# Patient Record
Sex: Female | Born: 1975 | Race: Black or African American | Hispanic: No | Marital: Married | Smoking: Never smoker
Health system: Southern US, Community
[De-identification: ages and names within clinical notes are randomized; demographics above are authoritative.]

## PROBLEM LIST (undated history)

## (undated) DIAGNOSIS — Z9289 Personal history of other medical treatment: Secondary | ICD-10-CM

## (undated) DIAGNOSIS — J329 Chronic sinusitis, unspecified: Secondary | ICD-10-CM

## (undated) DIAGNOSIS — R079 Chest pain, unspecified: Secondary | ICD-10-CM

## (undated) DIAGNOSIS — A4902 Methicillin resistant Staphylococcus aureus infection, unspecified site: Secondary | ICD-10-CM

## (undated) DIAGNOSIS — R0602 Shortness of breath: Secondary | ICD-10-CM

## (undated) DIAGNOSIS — Z9884 Bariatric surgery status: Secondary | ICD-10-CM

## (undated) DIAGNOSIS — G4730 Sleep apnea, unspecified: Secondary | ICD-10-CM

## (undated) DIAGNOSIS — R102 Pelvic and perineal pain unspecified side: Secondary | ICD-10-CM

## (undated) DIAGNOSIS — I10 Essential (primary) hypertension: Secondary | ICD-10-CM

## (undated) DIAGNOSIS — N83209 Unspecified ovarian cyst, unspecified side: Secondary | ICD-10-CM

## (undated) DIAGNOSIS — K760 Fatty (change of) liver, not elsewhere classified: Secondary | ICD-10-CM

## (undated) DIAGNOSIS — E7800 Pure hypercholesterolemia, unspecified: Secondary | ICD-10-CM

## (undated) DIAGNOSIS — R51 Headache: Secondary | ICD-10-CM

## (undated) DIAGNOSIS — M549 Dorsalgia, unspecified: Secondary | ICD-10-CM

## (undated) DIAGNOSIS — D649 Anemia, unspecified: Secondary | ICD-10-CM

## (undated) DIAGNOSIS — R160 Hepatomegaly, not elsewhere classified: Secondary | ICD-10-CM

## (undated) DIAGNOSIS — M419 Scoliosis, unspecified: Secondary | ICD-10-CM

## (undated) DIAGNOSIS — F32A Depression, unspecified: Secondary | ICD-10-CM

## (undated) DIAGNOSIS — N390 Urinary tract infection, site not specified: Secondary | ICD-10-CM

## (undated) DIAGNOSIS — E538 Deficiency of other specified B group vitamins: Secondary | ICD-10-CM

## (undated) DIAGNOSIS — R519 Headache, unspecified: Secondary | ICD-10-CM

## (undated) DIAGNOSIS — F329 Major depressive disorder, single episode, unspecified: Secondary | ICD-10-CM

## (undated) DIAGNOSIS — B019 Varicella without complication: Secondary | ICD-10-CM

## (undated) DIAGNOSIS — E559 Vitamin D deficiency, unspecified: Secondary | ICD-10-CM

## (undated) DIAGNOSIS — F1011 Alcohol abuse, in remission: Secondary | ICD-10-CM

## (undated) DIAGNOSIS — R000 Tachycardia, unspecified: Secondary | ICD-10-CM

## (undated) DIAGNOSIS — F419 Anxiety disorder, unspecified: Secondary | ICD-10-CM

## (undated) DIAGNOSIS — F319 Bipolar disorder, unspecified: Secondary | ICD-10-CM

## (undated) DIAGNOSIS — M1990 Unspecified osteoarthritis, unspecified site: Secondary | ICD-10-CM

## (undated) HISTORY — PX: TUBAL LIGATION: SHX77

## (undated) HISTORY — DX: Pure hypercholesterolemia, unspecified: E78.00

## (undated) HISTORY — DX: Vitamin D deficiency, unspecified: E55.9

## (undated) HISTORY — DX: Urinary tract infection, site not specified: N39.0

## (undated) HISTORY — DX: Essential (primary) hypertension: I10

## (undated) HISTORY — DX: Varicella without complication: B01.9

## (undated) HISTORY — PX: NASAL SINUS SURGERY: SHX719

## (undated) HISTORY — DX: Chest pain, unspecified: R07.9

## (undated) HISTORY — DX: Anemia, unspecified: D64.9

## (undated) HISTORY — DX: Personal history of other medical treatment: Z92.89

## (undated) HISTORY — DX: Bipolar disorder, unspecified: F31.9

## (undated) HISTORY — DX: Sleep apnea, unspecified: G47.30

## (undated) HISTORY — DX: Fatty (change of) liver, not elsewhere classified: K76.0

## (undated) HISTORY — DX: Chronic sinusitis, unspecified: J32.9

## (undated) HISTORY — DX: Headache, unspecified: R51.9

## (undated) HISTORY — DX: Shortness of breath: R06.02

## (undated) HISTORY — DX: Depression, unspecified: F32.A

## (undated) HISTORY — DX: Deficiency of other specified B group vitamins: E53.8

## (undated) HISTORY — DX: Major depressive disorder, single episode, unspecified: F32.9

## (undated) HISTORY — PX: GASTRIC BYPASS: SHX52

## (undated) HISTORY — DX: Dorsalgia, unspecified: M54.9

## (undated) HISTORY — DX: Headache: R51

---

## 1996-10-18 HISTORY — PX: CHOLECYSTECTOMY: SHX55

## 1998-01-06 ENCOUNTER — Ambulatory Visit (HOSPITAL_COMMUNITY): Admission: RE | Admit: 1998-01-06 | Discharge: 1998-01-06 | Payer: Self-pay | Admitting: Internal Medicine

## 1998-01-23 ENCOUNTER — Ambulatory Visit (HOSPITAL_COMMUNITY): Admission: RE | Admit: 1998-01-23 | Discharge: 1998-01-24 | Payer: Self-pay | Admitting: General Surgery

## 1998-04-23 ENCOUNTER — Other Ambulatory Visit: Admission: RE | Admit: 1998-04-23 | Discharge: 1998-04-23 | Payer: Self-pay | Admitting: Obstetrics

## 1998-09-01 ENCOUNTER — Ambulatory Visit (HOSPITAL_COMMUNITY): Admission: RE | Admit: 1998-09-01 | Discharge: 1998-09-01 | Payer: Self-pay | Admitting: Obstetrics

## 1998-11-27 ENCOUNTER — Other Ambulatory Visit: Admission: RE | Admit: 1998-11-27 | Discharge: 1998-11-27 | Payer: Self-pay | Admitting: Obstetrics

## 1999-08-01 ENCOUNTER — Emergency Department (HOSPITAL_COMMUNITY): Admission: EM | Admit: 1999-08-01 | Discharge: 1999-08-01 | Payer: Self-pay | Admitting: *Deleted

## 1999-12-05 ENCOUNTER — Emergency Department (HOSPITAL_COMMUNITY): Admission: EM | Admit: 1999-12-05 | Discharge: 1999-12-05 | Payer: Self-pay | Admitting: Emergency Medicine

## 1999-12-08 ENCOUNTER — Emergency Department (HOSPITAL_COMMUNITY): Admission: EM | Admit: 1999-12-08 | Discharge: 1999-12-08 | Payer: Self-pay

## 2000-10-18 HISTORY — PX: GASTRIC BYPASS: SHX52

## 2002-10-30 ENCOUNTER — Ambulatory Visit (HOSPITAL_COMMUNITY): Admission: RE | Admit: 2002-10-30 | Discharge: 2002-10-30 | Payer: Self-pay | Admitting: Internal Medicine

## 2002-10-30 ENCOUNTER — Encounter: Payer: Self-pay | Admitting: Internal Medicine

## 2003-02-09 ENCOUNTER — Emergency Department (HOSPITAL_COMMUNITY): Admission: EM | Admit: 2003-02-09 | Discharge: 2003-02-09 | Payer: Self-pay | Admitting: Emergency Medicine

## 2003-02-09 ENCOUNTER — Encounter: Payer: Self-pay | Admitting: Emergency Medicine

## 2003-07-15 ENCOUNTER — Emergency Department (HOSPITAL_COMMUNITY): Admission: EM | Admit: 2003-07-15 | Discharge: 2003-07-15 | Payer: Self-pay | Admitting: Emergency Medicine

## 2003-07-15 ENCOUNTER — Encounter: Payer: Self-pay | Admitting: Emergency Medicine

## 2004-02-20 ENCOUNTER — Emergency Department (HOSPITAL_COMMUNITY): Admission: EM | Admit: 2004-02-20 | Discharge: 2004-02-21 | Payer: Self-pay | Admitting: Emergency Medicine

## 2004-03-03 ENCOUNTER — Emergency Department (HOSPITAL_COMMUNITY): Admission: EM | Admit: 2004-03-03 | Discharge: 2004-03-03 | Payer: Self-pay

## 2004-04-23 ENCOUNTER — Emergency Department (HOSPITAL_COMMUNITY): Admission: EM | Admit: 2004-04-23 | Discharge: 2004-04-24 | Payer: Self-pay | Admitting: Emergency Medicine

## 2004-06-30 ENCOUNTER — Emergency Department (HOSPITAL_COMMUNITY): Admission: EM | Admit: 2004-06-30 | Discharge: 2004-06-30 | Payer: Self-pay | Admitting: *Deleted

## 2004-07-06 ENCOUNTER — Emergency Department (HOSPITAL_COMMUNITY): Admission: EM | Admit: 2004-07-06 | Discharge: 2004-07-06 | Payer: Self-pay | Admitting: Family Medicine

## 2005-02-19 ENCOUNTER — Emergency Department (HOSPITAL_COMMUNITY): Admission: EM | Admit: 2005-02-19 | Discharge: 2005-02-19 | Payer: Self-pay | Admitting: Emergency Medicine

## 2005-09-20 ENCOUNTER — Emergency Department (HOSPITAL_COMMUNITY): Admission: EM | Admit: 2005-09-20 | Discharge: 2005-09-21 | Payer: Self-pay | Admitting: Emergency Medicine

## 2005-09-28 ENCOUNTER — Emergency Department (HOSPITAL_COMMUNITY): Admission: EM | Admit: 2005-09-28 | Discharge: 2005-09-29 | Payer: Self-pay | Admitting: Emergency Medicine

## 2005-11-29 ENCOUNTER — Emergency Department (HOSPITAL_COMMUNITY): Admission: EM | Admit: 2005-11-29 | Discharge: 2005-11-29 | Payer: Self-pay | Admitting: Family Medicine

## 2006-02-18 ENCOUNTER — Emergency Department (HOSPITAL_COMMUNITY): Admission: EM | Admit: 2006-02-18 | Discharge: 2006-02-18 | Payer: Self-pay | Admitting: Family Medicine

## 2006-06-29 ENCOUNTER — Emergency Department (HOSPITAL_COMMUNITY): Admission: EM | Admit: 2006-06-29 | Discharge: 2006-06-29 | Payer: Self-pay | Admitting: Family Medicine

## 2007-08-08 ENCOUNTER — Emergency Department (HOSPITAL_COMMUNITY): Admission: EM | Admit: 2007-08-08 | Discharge: 2007-08-08 | Payer: Self-pay | Admitting: Emergency Medicine

## 2007-10-19 DIAGNOSIS — Z9289 Personal history of other medical treatment: Secondary | ICD-10-CM

## 2007-10-19 HISTORY — DX: Personal history of other medical treatment: Z92.89

## 2008-10-18 HISTORY — PX: OTHER SURGICAL HISTORY: SHX169

## 2008-11-11 ENCOUNTER — Inpatient Hospital Stay (HOSPITAL_COMMUNITY): Admission: EM | Admit: 2008-11-11 | Discharge: 2008-11-12 | Payer: Self-pay | Admitting: Emergency Medicine

## 2008-11-11 ENCOUNTER — Ambulatory Visit: Payer: Self-pay | Admitting: *Deleted

## 2008-12-04 ENCOUNTER — Encounter (INDEPENDENT_AMBULATORY_CARE_PROVIDER_SITE_OTHER): Payer: Self-pay | Admitting: Obstetrics

## 2008-12-04 ENCOUNTER — Ambulatory Visit (HOSPITAL_COMMUNITY): Admission: RE | Admit: 2008-12-04 | Discharge: 2008-12-04 | Payer: Self-pay | Admitting: Obstetrics

## 2009-01-27 ENCOUNTER — Encounter (INDEPENDENT_AMBULATORY_CARE_PROVIDER_SITE_OTHER): Payer: Self-pay | Admitting: Otolaryngology

## 2009-01-27 ENCOUNTER — Ambulatory Visit (HOSPITAL_BASED_OUTPATIENT_CLINIC_OR_DEPARTMENT_OTHER): Admission: RE | Admit: 2009-01-27 | Discharge: 2009-01-27 | Payer: Self-pay | Admitting: Otolaryngology

## 2009-05-09 ENCOUNTER — Emergency Department (HOSPITAL_COMMUNITY): Admission: EM | Admit: 2009-05-09 | Discharge: 2009-05-09 | Payer: Self-pay | Admitting: Emergency Medicine

## 2009-06-13 ENCOUNTER — Emergency Department (HOSPITAL_COMMUNITY): Admission: EM | Admit: 2009-06-13 | Discharge: 2009-06-13 | Payer: Self-pay | Admitting: Emergency Medicine

## 2009-07-09 ENCOUNTER — Ambulatory Visit (HOSPITAL_COMMUNITY): Admission: RE | Admit: 2009-07-09 | Discharge: 2009-07-09 | Payer: Self-pay | Admitting: Obstetrics

## 2009-12-09 ENCOUNTER — Emergency Department (HOSPITAL_COMMUNITY): Admission: EM | Admit: 2009-12-09 | Discharge: 2009-12-09 | Payer: Self-pay | Admitting: Emergency Medicine

## 2010-05-20 ENCOUNTER — Emergency Department (HOSPITAL_COMMUNITY): Admission: EM | Admit: 2010-05-20 | Discharge: 2010-05-20 | Payer: Self-pay | Admitting: Family Medicine

## 2011-01-13 ENCOUNTER — Inpatient Hospital Stay (INDEPENDENT_AMBULATORY_CARE_PROVIDER_SITE_OTHER)
Admission: RE | Admit: 2011-01-13 | Discharge: 2011-01-13 | Disposition: A | Discharge status: Home / Self Care | Payer: Self-pay | Source: Ambulatory Visit | Attending: Family Medicine | Admitting: Family Medicine

## 2011-01-13 DIAGNOSIS — N760 Acute vaginitis: Secondary | ICD-10-CM

## 2011-01-13 DIAGNOSIS — J309 Allergic rhinitis, unspecified: Secondary | ICD-10-CM

## 2011-01-13 LAB — POCT URINALYSIS DIP (DEVICE)
Hgb urine dipstick: NEGATIVE
Specific Gravity, Urine: 1.02 (ref 1.005–1.030)
pH: 6 (ref 5.0–8.0)

## 2011-01-13 LAB — WET PREP, GENITAL: Yeast Wet Prep HPF POC: NONE SEEN

## 2011-01-14 LAB — GC/CHLAMYDIA PROBE AMP, GENITAL: GC Probe Amp, Genital: NEGATIVE

## 2011-01-23 LAB — PREGNANCY, URINE: Preg Test, Ur: NEGATIVE

## 2011-01-23 LAB — CBC
HCT: 38.3 % (ref 36.0–46.0)
Hemoglobin: 12.9 g/dL (ref 12.0–15.0)
MCHC: 33.6 g/dL (ref 30.0–36.0)
Platelets: 260 10*3/uL (ref 150–400)
RBC: 4.51 MIL/uL (ref 3.87–5.11)
WBC: 10.2 10*3/uL (ref 4.0–10.5)

## 2011-01-23 LAB — URINALYSIS, ROUTINE W REFLEX MICROSCOPIC
Bilirubin Urine: NEGATIVE
Glucose, UA: NEGATIVE mg/dL
Hgb urine dipstick: NEGATIVE
pH: 6 (ref 5.0–8.0)

## 2011-01-23 LAB — BASIC METABOLIC PANEL
Calcium: 8.7 mg/dL (ref 8.4–10.5)
Creatinine, Ser: 0.71 mg/dL (ref 0.4–1.2)
Glucose, Bld: 95 mg/dL (ref 70–99)
Sodium: 133 mEq/L — ABNORMAL LOW (ref 135–145)

## 2011-01-23 LAB — DIFFERENTIAL
Basophils Relative: 1 % (ref 0–1)
Eosinophils Relative: 4 % (ref 0–5)
Lymphocytes Relative: 28 % (ref 12–46)
Neutro Abs: 6.4 10*3/uL (ref 1.7–7.7)

## 2011-01-27 LAB — POCT HEMOGLOBIN-HEMACUE: Hemoglobin: 11.6 g/dL — ABNORMAL LOW (ref 12.0–15.0)

## 2011-02-01 LAB — URINALYSIS, ROUTINE W REFLEX MICROSCOPIC
Glucose, UA: NEGATIVE mg/dL
Hgb urine dipstick: NEGATIVE
Protein, ur: NEGATIVE mg/dL
pH: 6.5 (ref 5.0–8.0)

## 2011-02-01 LAB — POCT I-STAT, CHEM 8
BUN: 4 mg/dL — ABNORMAL LOW (ref 6–23)
Hemoglobin: 8.5 g/dL — ABNORMAL LOW (ref 12.0–15.0)
Potassium: 3.7 mEq/L (ref 3.5–5.1)
Sodium: 139 mEq/L (ref 135–145)
TCO2: 22 mmol/L (ref 0–100)

## 2011-02-01 LAB — DIFFERENTIAL
Basophils Relative: 0 % (ref 0–1)
Eosinophils Relative: 2 % (ref 0–5)
Lymphocytes Relative: 42 % (ref 12–46)
Monocytes Relative: 6 % (ref 3–12)
Neutro Abs: 2.5 10*3/uL (ref 1.7–7.7)
Neutrophils Relative %: 50 % (ref 43–77)

## 2011-02-01 LAB — CROSSMATCH: Antibody Screen: NEGATIVE

## 2011-02-01 LAB — CBC
HCT: 21.7 % — ABNORMAL LOW (ref 36.0–46.0)
HCT: 24.6 % — ABNORMAL LOW (ref 36.0–46.0)
HCT: 29.3 % — ABNORMAL LOW (ref 36.0–46.0)
Hemoglobin: 6.1 g/dL — CL (ref 12.0–15.0)
Hemoglobin: 7.4 g/dL — CL (ref 12.0–15.0)
Hemoglobin: 9.1 g/dL — ABNORMAL LOW (ref 12.0–15.0)
MCHC: 30 g/dL (ref 30.0–36.0)
MCHC: 30.2 g/dL (ref 30.0–36.0)
MCHC: 31.1 g/dL (ref 30.0–36.0)
MCV: 61.8 fL — ABNORMAL LOW (ref 78.0–100.0)
MCV: 62.9 fL — ABNORMAL LOW (ref 78.0–100.0)
Platelets: 282 10*3/uL (ref 150–400)
RBC: 3.94 MIL/uL (ref 3.87–5.11)
RBC: 4.2 MIL/uL (ref 3.87–5.11)
RDW: 21.9 % — ABNORMAL HIGH (ref 11.5–15.5)
RDW: 30.2 % — ABNORMAL HIGH (ref 11.5–15.5)
RDW: 30.7 % — ABNORMAL HIGH (ref 11.5–15.5)
RDW: 32.8 % — ABNORMAL HIGH (ref 11.5–15.5)

## 2011-02-01 LAB — IRON AND TIBC
Iron: 306 ug/dL — ABNORMAL HIGH (ref 42–135)
Iron: <10 ug/dL — ABNORMAL LOW (ref 42–135)
UIBC: 402 ug/dL

## 2011-02-01 LAB — COMPREHENSIVE METABOLIC PANEL
BUN: 4 mg/dL — ABNORMAL LOW (ref 6–23)
Calcium: 8.2 mg/dL — ABNORMAL LOW (ref 8.4–10.5)
Glucose, Bld: 88 mg/dL (ref 70–99)
Total Protein: 6.2 g/dL (ref 6.0–8.3)

## 2011-02-01 LAB — METHYLMALONIC ACID, SERUM: Methylmalonic Acid, Quantitative: 419 nmol/L — ABNORMAL HIGH (ref 87–318)

## 2011-02-01 LAB — RETICULOCYTES
RBC.: 3.83 MIL/uL — ABNORMAL LOW (ref 3.87–5.11)
Retic Count, Absolute: 34.5 10*3/uL (ref 19.0–186.0)
Retic Ct Pct: 0.5 % (ref 0.4–3.1)

## 2011-02-01 LAB — APTT: aPTT: 41 seconds — ABNORMAL HIGH (ref 24–37)

## 2011-02-01 LAB — VITAMIN B12: Vitamin B-12: 233 pg/mL (ref 211–911)

## 2011-02-01 LAB — ABO/RH: ABO/RH(D): B NEG

## 2011-02-01 LAB — LIPID PANEL
Cholesterol: 124 mg/dL (ref 0–200)
LDL Cholesterol: 81 mg/dL (ref 0–99)
Triglycerides: 57 mg/dL (ref <150)

## 2011-02-01 LAB — POCT PREGNANCY, URINE: Preg Test, Ur: NEGATIVE

## 2011-02-01 LAB — HEMOGLOBIN A1C
Hgb A1c MFr Bld: 5.1 % (ref 4.6–6.1)
Mean Plasma Glucose: 100 mg/dL

## 2011-02-01 LAB — LACTATE DEHYDROGENASE: LDH: 122 U/L (ref 94–250)

## 2011-02-01 LAB — PROTIME-INR: INR: 1.1 (ref 0.00–1.49)

## 2011-02-01 LAB — FERRITIN: Ferritin: 3 ng/mL — ABNORMAL LOW (ref 10–291)

## 2011-02-01 LAB — HEMOCCULT GUIAC POC 1CARD (OFFICE): Fecal Occult Bld: NEGATIVE

## 2011-02-02 LAB — PREGNANCY, URINE: Preg Test, Ur: NEGATIVE

## 2011-02-02 LAB — CBC
MCHC: 31.1 g/dL (ref 30.0–36.0)
RBC: 4.76 MIL/uL (ref 3.87–5.11)
RDW: 36.6 % — ABNORMAL HIGH (ref 11.5–15.5)

## 2011-02-08 ENCOUNTER — Emergency Department (HOSPITAL_COMMUNITY)
Admission: EM | Admit: 2011-02-08 | Discharge: 2011-02-09 | Disposition: A | Discharge status: Home / Self Care | Payer: Self-pay | Attending: Emergency Medicine | Admitting: Emergency Medicine

## 2011-02-08 ENCOUNTER — Emergency Department (HOSPITAL_COMMUNITY): Payer: Self-pay

## 2011-02-08 DIAGNOSIS — R05 Cough: Secondary | ICD-10-CM | POA: Insufficient documentation

## 2011-02-08 DIAGNOSIS — R0609 Other forms of dyspnea: Secondary | ICD-10-CM | POA: Insufficient documentation

## 2011-02-08 DIAGNOSIS — F410 Panic disorder [episodic paroxysmal anxiety] without agoraphobia: Secondary | ICD-10-CM | POA: Insufficient documentation

## 2011-02-08 DIAGNOSIS — R079 Chest pain, unspecified: Secondary | ICD-10-CM | POA: Insufficient documentation

## 2011-02-08 DIAGNOSIS — R000 Tachycardia, unspecified: Secondary | ICD-10-CM | POA: Insufficient documentation

## 2011-02-08 DIAGNOSIS — Z9889 Other specified postprocedural states: Secondary | ICD-10-CM | POA: Insufficient documentation

## 2011-02-08 DIAGNOSIS — R0989 Other specified symptoms and signs involving the circulatory and respiratory systems: Secondary | ICD-10-CM | POA: Insufficient documentation

## 2011-02-08 DIAGNOSIS — R059 Cough, unspecified: Secondary | ICD-10-CM | POA: Insufficient documentation

## 2011-02-09 LAB — CBC
HCT: 40.2 % (ref 36.0–46.0)
Hemoglobin: 13.6 g/dL (ref 12.0–15.0)
MCH: 28.9 pg (ref 26.0–34.0)
MCHC: 33.8 g/dL (ref 30.0–36.0)
MCV: 85.5 fL (ref 78.0–100.0)
Platelets: 268 10*3/uL (ref 150–400)
RBC: 4.7 MIL/uL (ref 3.87–5.11)
RDW: 13.5 % (ref 11.5–15.5)
WBC: 10.3 10*3/uL (ref 4.0–10.5)

## 2011-02-09 LAB — DIFFERENTIAL
Basophils Absolute: 0 10*3/uL (ref 0.0–0.1)
Basophils Relative: 0 % (ref 0–1)
Eosinophils Absolute: 0.3 10*3/uL (ref 0.0–0.7)
Eosinophils Relative: 3 % (ref 0–5)
Lymphocytes Relative: 37 % (ref 12–46)
Lymphs Abs: 3.8 10*3/uL (ref 0.7–4.0)
Monocytes Absolute: 0.5 10*3/uL (ref 0.1–1.0)
Monocytes Relative: 5 % (ref 3–12)
Neutro Abs: 5.7 10*3/uL (ref 1.7–7.7)
Neutrophils Relative %: 55 % (ref 43–77)

## 2011-02-09 LAB — PROTIME-INR: Prothrombin Time: 12 seconds (ref 11.6–15.2)

## 2011-02-09 LAB — D-DIMER, QUANTITATIVE: D-Dimer, Quant: 0.25 ug/mL-FEU (ref 0.00–0.48)

## 2011-02-09 LAB — BASIC METABOLIC PANEL
BUN: 6 mg/dL (ref 6–23)
Creatinine, Ser: 0.87 mg/dL (ref 0.4–1.2)
GFR calc non Af Amer: >60 mL/min (ref >60)

## 2011-02-09 LAB — APTT: aPTT: 34 seconds (ref 24–37)

## 2011-03-02 NOTE — Discharge Summary (Signed)
NAME:  Valerie Morales, KRIKORIAN NO.:  [id number]   MEDICAL RECORD NO.:  [id number]          PATIENT TYPE:  INP   LOCATION:  4738                         FACILITY:  [facility]   PHYSICIAN:  Manning Charity, MD     DATE OF BIRTH:  August 06, 1976   DATE OF ADMISSION:  11/11/2008  DATE OF DISCHARGE:  11/12/2008                               DISCHARGE SUMMARY   ATTENDING PHYSICIAN:  Manning Charity, MD   CONTINUITY DOCTOR:  Kathreen Cosier, MD, gynecologist.   DISCHARGE DIAGNOSES:  1. Anemia, microcytic, microchromic, secondary to iron deficiency,      secondary to menometrorrhagia.  2. Chronic sinusitis.  3. Bilateral ovarian cysts by ultrasound.   DISCHARGE MEDICATIONS:  Ferrous sulfate 325 mg p.o. 3 times a day.   DISPOSITION ON FOLLOWUP:  Ms. [name] will follow up with her primary  care doctor on November 13, 2008, at 10:30 a.m.  During that appointment  consider to order methylmalonic level.  The patient might need vitamin  B12 supplement.  The patient might need a workup for von Willebrand, she  will need a bleeding time, and she will need also platelet quality  function.   PROCEDURE PERFORMED:  Transvaginal ultrasound:  shows normal sonographic  appearance of the uterus and endometrial measured up to 13 mm and  appears uniform, bilateral simple appearing ovarian cysts measuring up  to 2.9 cm, a small volume of pelvic free fluid likely physiologic.   HISTORY OF PRESENT ILLNESS:  This is a 35 year old woman with past  medical history significant for sinusitis, BTL in 1998, cholecystectomy,  and C-section who presented to emergency department secondary to being  found to have a low hemoglobin level at a surgical center.  The patient  was supposed to have a sinus surgery today and was noted to have  hemoglobin reportedly in the 5 range.  The patient reports, she has had  hemoglobin last checked in September 2009, which was at 8, and this was  done by Dr. [name].  She does  report heavy period and her last one was  on November 04, 2008.  She reports her periods are variable lasts 3-7  days, occurs every 21 days on an average, she used 3 tampons and 4 pads  daily.  The patient denies shortness of breath, dizziness, chest pain,  no fever, chills, melena, hematochezia, abdominal pain, and dysuria.  She was complaining of left maxillary sinus tenderness and cold  intolerance.   PHYSICAL EXAMINATION:  VITAL SIGNS:  Temperature  97.1, blood pressure  122/87, pulse 100, respirations 20, and sat 99% on room air.  GENERAL:  The patient is in no acute distress.  HEENT:  Pale-appearing eyes.  Pupil equal reactive to light, anicteric.  ENT:  No edema.  Positive maxillary sinus tenderness.  RESPIRATION:  Clear to auscultation.  CARDIOVASCULAR:  S1 and S2 normal and tachycardic.  No murmur.  GASTROINTESTINAL:  Soft, nontender, and nondistended.  Bowel sounds are  positive.  EXTREMITY:  No edema.  PSYCHIATRIC:  Appropriate.   ADMISSION LABS:  FOBT negative.  Sodium 139, potassium 3.7, chloride  105, bicarb 2,  BUN 4, creatinine 0.6, and glucose 88.  Hemoglobin 6.1,  white blood cell 5.0, hematocrit  21.7, and platelets 324.  MCV 55, RDW  21.  She has teardrop cell and elliptocytes.   HOSPITAL COURSE:  1. Anemia, microcytic, microchromic.  This is likely secondary to iron      deficiency anemia, secondary to menorrhagia also nutrition      deficiency secondary to gastric bypass can be also causing      patient's anemia.  The patient was admitted, anemia panel was      ordered, and this showed iron less than 10, B12 233, normal      borderline low, folate level 6.9, ferritin level 2,  this is      consistent with iron deficiency anemia.  The patient might need to      have intrinsic factor antibody, B12 level was in the low normal      range.  She might benefit from B12 supplement injection as an      outpatient.  The patient had also teardrop cell, she might need       electrophoresis of proteins to rule out thalassemia.  It was not      able to perform in the hospital, because the patient already      received transfusion and this may affect the results of the protein      electrophoresis.  Also, the patient will need a workup for bleeding      disorders.  She has heavy menstrual period and occasional      nosebleeds.  Her TSH was normal 0.667.  She will need a bleeding      time, von Willebrand factor, and platelet qualitative function.      Her platelet count was within normal limit at 256.  PT was normal      14.1, PTT was 41 slightly elevated these can be consistent with von      Willebrand disease.  Also a workup for hemolysis was done, LDH was      122 within normal limits.   1. Tachycardia.  It was sinus tachycardia by EKG.  It was likely      secondary to anemia after  transfusion patient's pulse decreased to      73.  TSH was normal to rule out hyperthyroidism.  2. The patient was discharged in good condition.  She denies any      bleeding.   LABS ON DISCHARGE:  Sodium 141, potassium 3.9, chloride 110, CO2 25,  glucose 88, BUN 4, creatinine 0.63, bilirubin 0.9, alkaline phosphatase  61, AST 19, ALT 12, albumin 3.3, calcium 8.2.  Hemoglobin A1c 5.1.  Lipid profile, cholesterol 124, HDL 32, LDL 81, VLDL 11.  Hemoglobin  9.1.   The patient was discharged in good condition.      Hartley Barefoot, MD  Electronically Signed      Manning Charity, MD  Electronically Signed    BR/MEDQ  D:  11/12/2008  T:  11/13/2008  Job:  [id number]

## 2011-03-02 NOTE — Op Note (Signed)
NAME:  Valerie Morales, Valerie Morales            ACCOUNT NO.:  [id number]   MEDICAL RECORD NO.:  [id number]          PATIENT TYPE:  AMB   LOCATION:  DSC                          FACILITY:  [facility]   PHYSICIAN:  Jefry H. Pollyann Kennedy, MD     DATE OF BIRTH:  1976-07-12   DATE OF PROCEDURE:  01/27/2009  DATE OF DISCHARGE:                               OPERATIVE REPORT   PREOPERATIVE DIAGNOSES:  1. Nasal septal deviation.  2. Inferior turbinate hyperplasia.  3. Large left concha bullosa.  4. Bilateral chronic ethmoid sinusitis.  5. Bilateral chronic maxillary sinusitis.   POSTOPERATIVE DIAGNOSES:  1. Nasal septal deviation.  2. Inferior turbinate hyperplasia.  3. Large left concha bullosa.  4. Bilateral chronic ethmoid sinusitis.  5. Bilateral chronic maxillary sinusitis.   PROCEDURES:  1. Nasal septoplasty.  2. Submucous resection, inferior turbinates bilaterally.  3. Endoscopic resection, left concha bullosa.  4. Bilateral endoscopic total ethmoidectomy.  5. Bilateral endoscopic maxillary antrostomy.   SURGEON:  Jefry H. Pollyann Kennedy, MD   ANESTHESIA:  General endotracheal anesthesia was used.   BLOOD LOSS:  Less than 50 mL.   FINDINGS:  Severe rightward septal deviation, bony enlargement of the  inferior turbinates, very large concha bullosa of the left middle  turbinate, thick mucoid material within the left maxillary antrum, and  extensive polypoid changes throughout the ethmoid complex bilaterally.   HISTORY:  A 35 year old with a history of chronic nasal obstruction and  sinusitis.  She has not responded to aggressive medical therapy.  She  was recently diagnosed with severe anemia and treated for that, the  source was felt to be GYN in origin.  Risks, benefits, alternatives, and  complications of the procedure were explained to the patient, who seemed  to understand and agreed to surgery.   PROCEDURE:  The patient was taken to the operating room and placed in  the operating table in supine  position.  Following induction of general  endotracheal anesthesia, the patient was prepped and draped in standard  fashion.  Afrin spray was used preop in the nose.  Xylocaine with  epinephrine was infiltrated into the septum, the columella, and the  inferior turbinates bilaterally.  Afrin-soaked pledgets were placed in  the nasal cavities bilaterally.  1. Nasal septoplasty.  Left hemitransfixion incision was created in      the left side of the nasal septum with a 15 scalpel and      mucoperichondrial flap was developed on  the left side of the nose.      The bony cartilaginous junction was divided and a similar flap was      developed on the right side.  There was a severe rightward      deflection and some tearing of the right mucosa was created, but      there was no corresponding tear on the left side.  The superior      aspect of the ethmoid plate was severely rightward deflected and      was resected using the Jansen-Middleton rongeur.  The remainder of      the ethmoid plate that was deviated  was removed as well.  The      quadrangular cartilage was in good position except for inferiorly      where there was some overhang overlying the maxillary crest and the      maxillary crest itself was deflected to the left side.  This was      all resected.  The septal incision was reapproximated with chromic      suture.  The septal flaps were quilted with plain gut.  2. Submucous resection of inferior turbinates bilaterally.  The      leading edge of the inferior turbinates was incised in a vertical      fashion with a 15 scalpel.  A Cottle elevator was used to elevate      mucosa off bone and fragments of bone were resected using Takahashi      forceps.  The turbinate remnants were outfractured with the Cottle      elevator.  3. Left concha bullosa resection.  The middle turbinates were injected      with Xylocaine with epinephrine, superior and posterior      attachments.  The large  left concha bullosa was treated by removing      the lateral lamella using a sickle knife and through cut forceps.      The medial lamella was kept intact mostly and was left up against      the nasal septum.  4. Bilateral total endoscopic ethmoidectomy.  Using combination of 0-      and 30-degree nasal endoscopes, bilateral complete ethmoid      dissection was accomplished.  On the right side, the uncinate      process was taken down with a sickle knife.  On the left side, the      previous concha bullosa resection exposed the infundibulum nicely.      Complete dissection was accomplished using mostly the      microdebrider, but also using angled forceps, taking great care to      keep the fovea intact superiorly and the lamina papyracea intact      laterally.  There were no dehiscences identified.  There was      multiple of polypoid and diseased cells found on both sides.  The      ground lamella was taken down exposing posterior ethmoid cells.      Complete dissection was performed bilaterally.  During the      procedure, it was noted that the left pupillary response was      sluggish compared to the right.  There was a response however and      there was never any exposure of the orbit during the procedure.      This was felt to be due to the local anesthetic application.  5. Bilateral endoscopic maxillary antrostomy.  Maxillary sinuses were      entered in the fontanelle using curved suction.  The antrostomy was      enlarged anteriorly using backbiting forceps and posteriorly using      through cut forceps.  On the left side, there was thick inspissated      mucus contained within the maxillary antrum and on the right side,      the sinus was clear today.  After adequate dissection was      completed, the sinus cavities were suctioned of all blood and      debris and packed with Kyung Rudd ethmoid packs bilaterally.  These  were inflated using the local anesthetic solution.  The  nasal      cavities were packed with rolled up Telfa and coated with      bacitracin.  The patient was then awakened, extubated, and      transferred to recovery in stable condition.      Jefry H. Pollyann Kennedy, MD  Electronically Signed     JHR/MEDQ  D:  01/27/2009  T:  01/28/2009  Job:  [id number]

## 2011-03-02 NOTE — Op Note (Signed)
NAME:  Valerie Morales, Valerie Morales            ACCOUNT NO.:  [id number]   MEDICAL RECORD NO.:  [id number]          PATIENT TYPE:  AMB   LOCATION:  SDC                           FACILITY:  [facility]   PHYSICIAN:  Kathreen Cosier, M.D.DATE OF BIRTH:  23-Feb-1976   DATE OF PROCEDURE:  12/04/2008  DATE OF DISCHARGE:                               OPERATIVE REPORT   PREOPERATIVE DIAGNOSIS:  Dysfunctional uterine bleeding.   PROCEDURES:  1. Dilation and curettage.  2. Hysteroscopy.  3. NovaSure ablation.   SURGEON:  Kathreen Cosier, MD   ANESTHESIA:  General.   PROCEDURE:  The patient placed in the operating table in supine  position.  Abdomen prepped and draped.  Bladder emptied with a straight  catheter.  Bimanual exam, uterus top normal sized with myomas.  Speculum  placed in the vagina.  Cervix injected with 9 mL of 1% Xylocaine.  Then,  the cervix grasped with tenaculum, endocervix curetted, small amount of  tissue obtained.  Endometrial cavity sounded 9 cm.  The cervix length  was 4 cm.  Cavity length 5 cm.  The cervix dilated with #25 Shawnie Pons and  the hysteroscope inserted.  The cavity was normal.  Then, the  hysteroscope removed and a sharp curettage performed.  A moderate amount  of tissue obtained.  NovaSure device inserted in the cavity.  The  integrity test was passed.  Ablation was performed for 125 seconds at  124 watts.  The NovaSure device was removed and the hysteroscope  reinserted and the cavity showed total ablation.  The hysteroscope was  removed.  Fluid deficit 45 mL.  The patient tolerated the procedure well  and taken to recovery room in good condition.           ______________________________  Kathreen Cosier, M.D.     BAM/MEDQ  D:  12/04/2008  T:  12/04/2008  Job:  [id number]

## 2011-04-07 IMAGING — CR DG HAND COMPLETE 3+V*L*
3 series · 3 of 3 positions shown · non-contrast
Comparison: None.

CLINICAL DATA: Pain, swelling

LEFT HAND - COMPLETE 3+ VIEW

[view not recorded (1 of 3)]
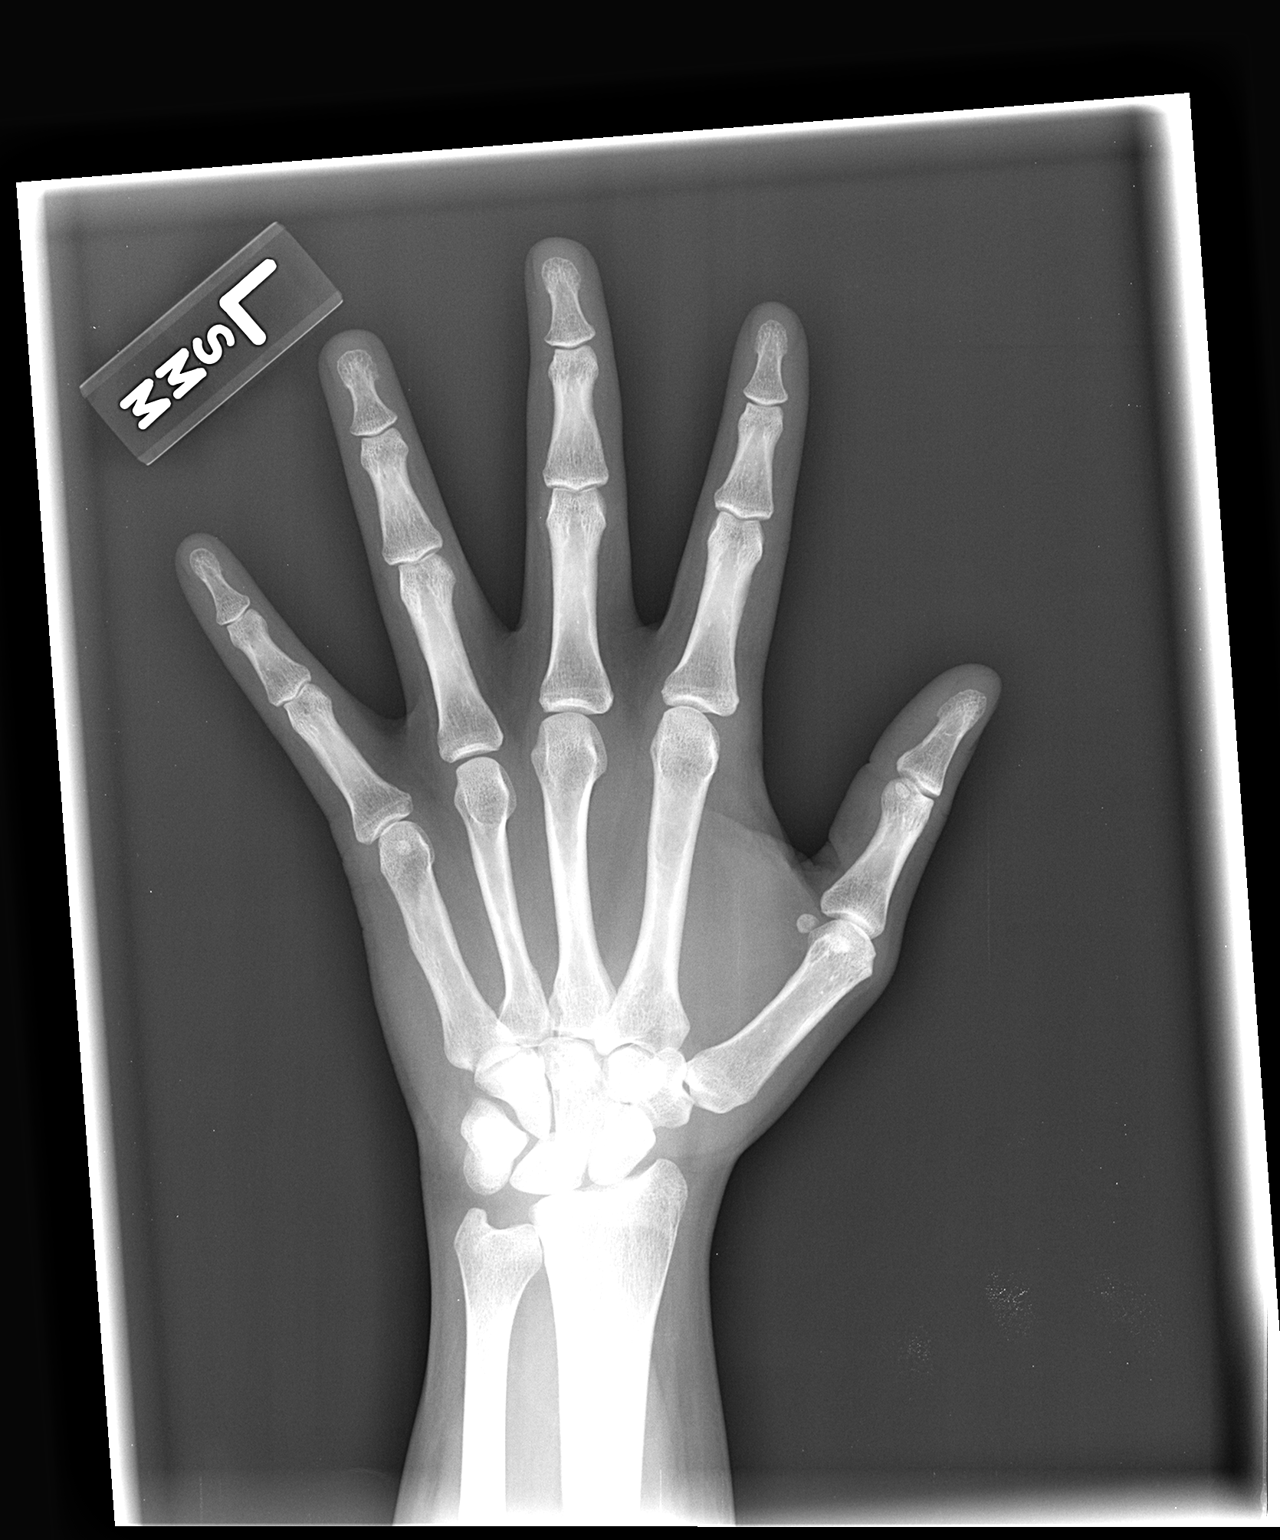

[view not recorded (2 of 3)]
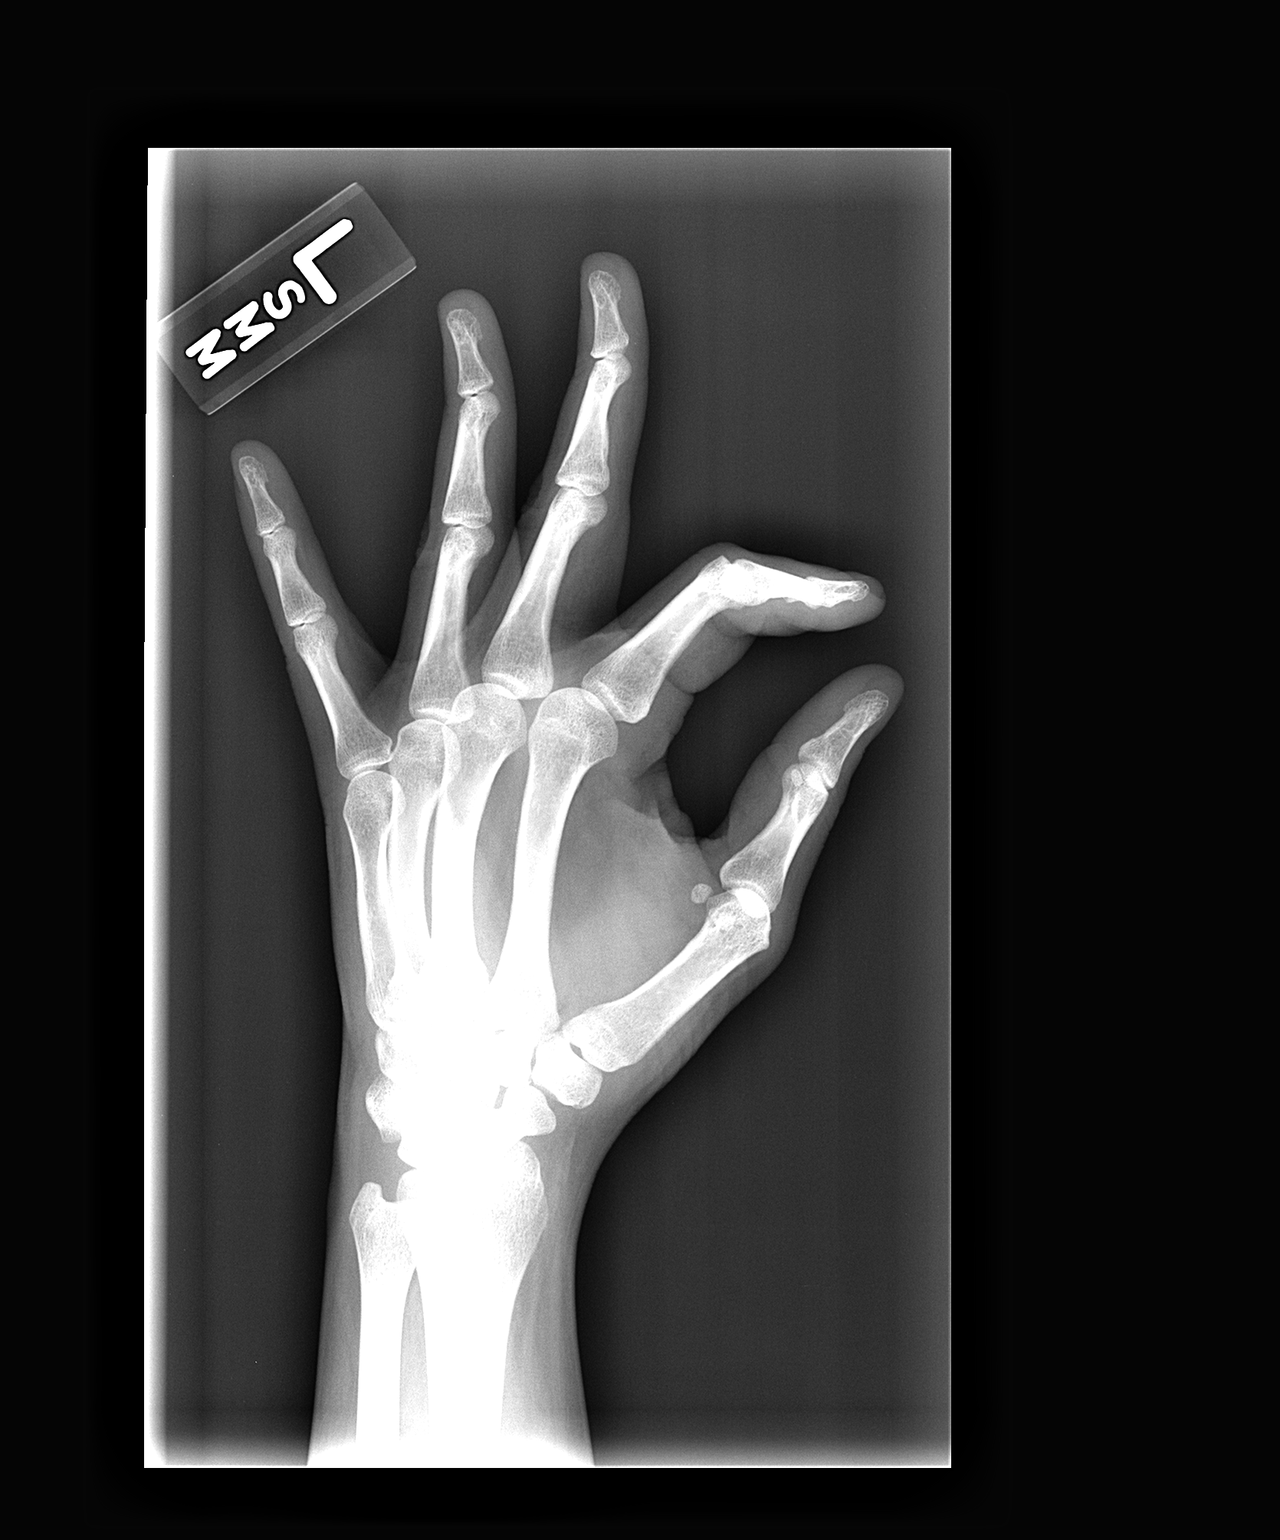

[view not recorded (3 of 3)]
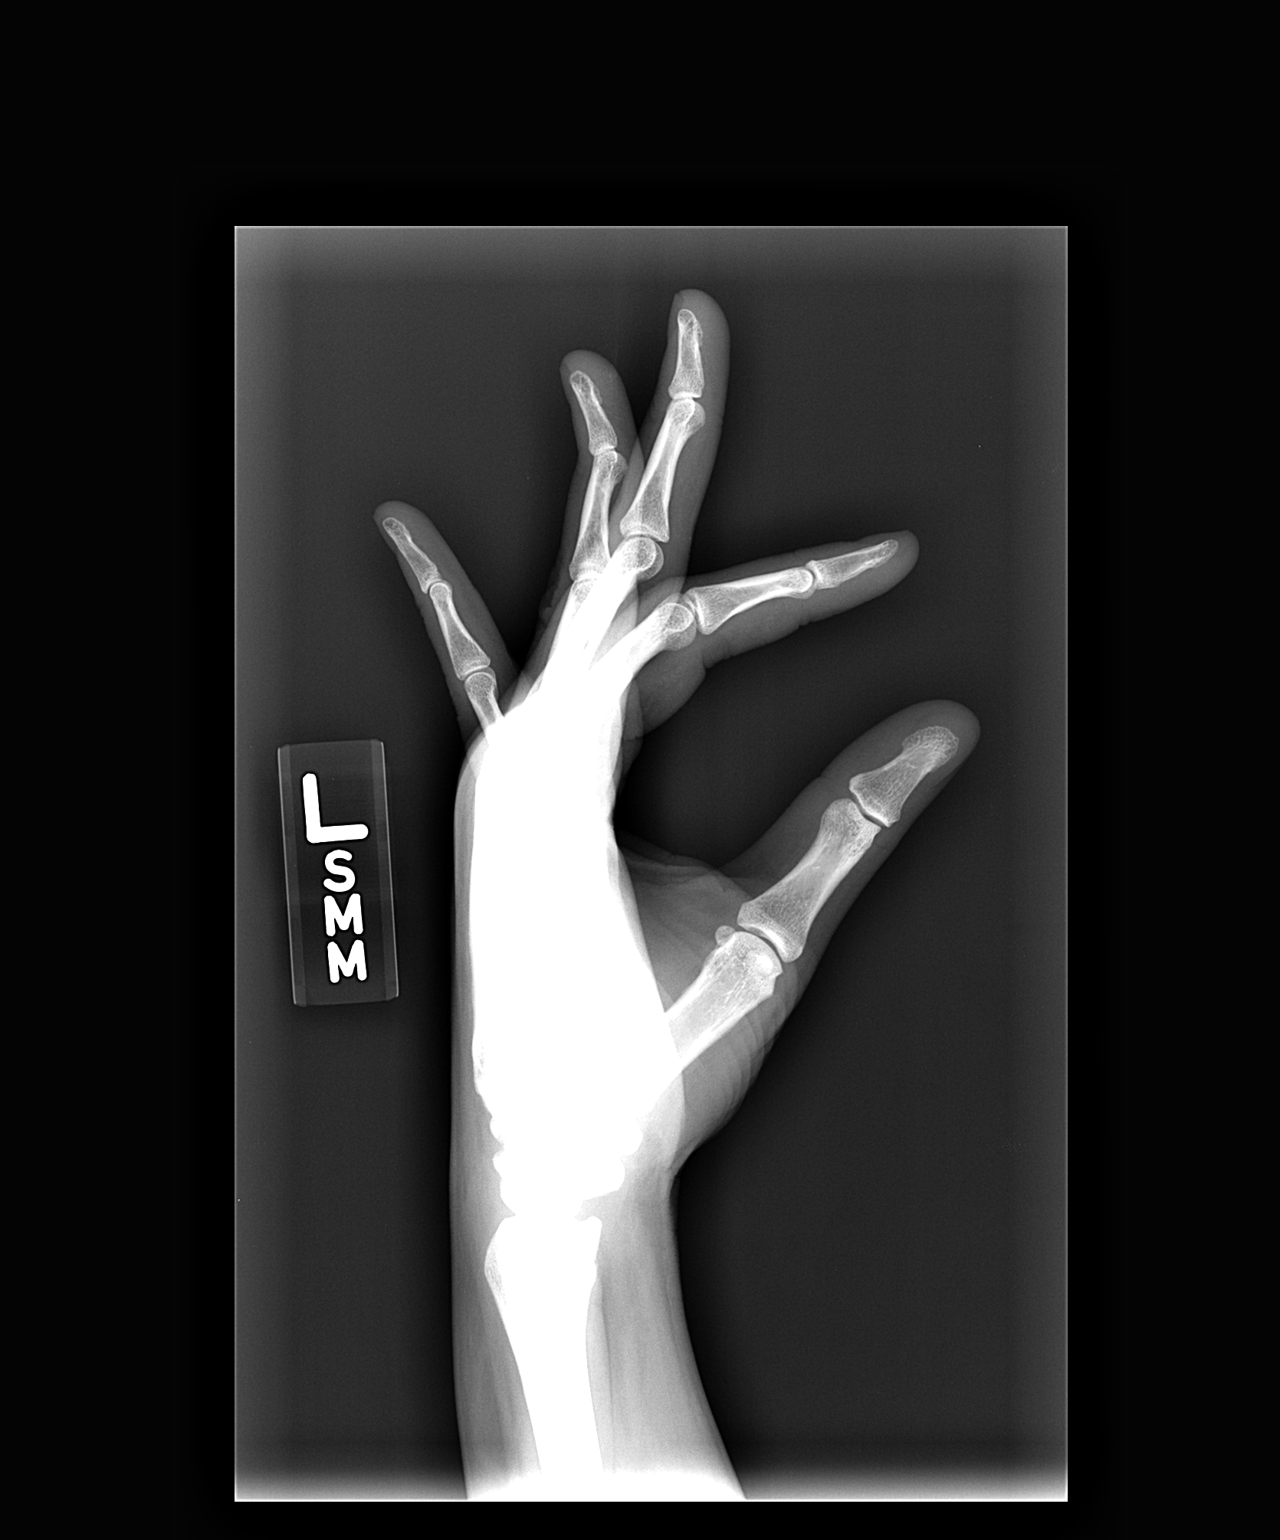

[3 of 3 positions shown; findings below may reference images not displayed]

FINDINGS: Normal alignment.  No acute fracture identified.
Preserved joint spaces.  No radiographic swelling or foreign body
identified.
IMPRESSION: No acute finding.

## 2012-02-21 ENCOUNTER — Emergency Department (INDEPENDENT_AMBULATORY_CARE_PROVIDER_SITE_OTHER)
Admission: EM | Admit: 2012-02-21 | Discharge: 2012-02-21 | Disposition: A | Discharge status: Home / Self Care | Payer: Self-pay | Source: Home / Self Care | Attending: Emergency Medicine | Admitting: Emergency Medicine

## 2012-02-21 ENCOUNTER — Encounter (HOSPITAL_COMMUNITY): Payer: Self-pay | Admitting: *Deleted

## 2012-02-21 DIAGNOSIS — L0201 Cutaneous abscess of face: Secondary | ICD-10-CM

## 2012-02-21 DIAGNOSIS — J329 Chronic sinusitis, unspecified: Secondary | ICD-10-CM

## 2012-02-21 DIAGNOSIS — L03211 Cellulitis of face: Secondary | ICD-10-CM

## 2012-02-21 HISTORY — DX: Methicillin resistant Staphylococcus aureus infection, unspecified site: A49.02

## 2012-02-21 HISTORY — DX: Chronic sinusitis, unspecified: J32.9

## 2012-02-21 MED ORDER — DOXYCYCLINE HYCLATE 100 MG PO CAPS: 100.0000 mg | ORAL_CAPSULE | Freq: Two times a day (BID) | ORAL | Status: AC

## 2012-02-21 MED ORDER — FLUTICASONE PROPIONATE 50 MCG/ACT NA SUSP: 2.0000 | Freq: Every day | NASAL | Status: DC

## 2012-02-21 MED ORDER — PSEUDOEPHEDRINE-GUAIFENESIN ER 120-1200 MG PO TB12: 1.0000 | ORAL_TABLET | Freq: Two times a day (BID) | ORAL | Status: DC

## 2012-02-21 NOTE — Discharge Instructions (Signed)
Take the medication as written. Finish the doxycycline. Warm compresses on face. Go to the ED for any of the signs/symptoms we discussed. Return if you get worse, have a fever >100.4, or for any concerns. You may take 600 mg of motrin with 1 gram of tylenol up to 4 times a day as needed for pain. This is an effective combination for pain.   Use a neti pot or the NeilMed sinus rinse as often as you want to to reduce nasal congestion. Follow the directions on the box.   Go to www.goodrx.com to look up your medications. This will give you a list of where you can find your prescriptions at the most affordable prices.

## 2012-02-21 NOTE — ED Provider Notes (Signed)
History     CSN: [id number]  Arrival date & time 02/21/12  1410   First MD Initiated Contact with Patient 02/21/12 1416      Chief Complaint  Patient presents with  . Oral Swelling    (Consider location/radiation/quality/duration/timing/severity/associated sxs/prior treatment) HPI Comments: Patient reports a small, painful, itchy "bump" on her right upper lip starting 4 days ago. No preceding paresthesia, blisters. No other facial rash. Reports localized swelling, redness. Some purulent drainage when she was "squeezing on it". She has been applying a topical cream and for cold sores on this without improvement. No nausea, vomiting, fevers. No dental pain, intraoral lesions, sore throat, ear pain.  No history of HSV, cold sores, aphthous ulcers. Denies mechanical, chemical, frontal trauma to the area. Reports frontal sinus pain/pressure, congestion, purulent nasal drainage which is consistent with previous episodes sinusitis. She's been taking OTC sinus medications with mild relief. No other headaches. Patient has a long history of repeated episodes of sinusitis, status post sinus surgery for this. .  ROS as noted in HPI. All other ROS negative.   Patient is a 36 y.o. female presenting with mouth sores.  Mouth Lesions  The current episode started 3 to 5 days ago. The problem has been gradually worsening. The symptoms are relieved by nothing. The symptoms are aggravated by nothing. Associated symptoms include mouth sores.    Past Medical History  Diagnosis Date  . Sinusitis     Past Surgical History  Procedure Date  . Nasal sinus surgery   . Cesarean section   . Uterine ablation   . Gastric bypass     History reviewed. No pertinent family history.  History  Substance Use Topics  . Smoking status: Never Smoker   . Smokeless tobacco: Not on file  . Alcohol Use: No    OB History    Grav Para Term Preterm Abortions TAB SAB Ect Mult Living                  Review of  Systems  HENT: Positive for mouth sores.     Allergies  Aspirin; Latex; and Percocet  Home Medications   Current Outpatient Rx  Name Route Sig Dispense Refill  . CAMPHOR-PHENOL 10.8-4.7 % EX LIQD Topical Apply topically daily as needed.    [name] Kitchen DIPHENHYDRAMINE HCL 25 MG PO TABS Oral Take 25 mg by mouth every 6 (six) hours as needed.    [name] Kitchen PSEUDOEPHEDRINE-ACETAMINOPHEN 30-500 MG PO TABS Oral Take 1 tablet by mouth every 4 (four) hours as needed.    [name] Kitchen DOXYCYCLINE HYCLATE 100 MG PO CAPS Oral Take 1 capsule (100 mg total) by mouth 2 (two) times daily. 20 capsule 0  . FLUTICASONE PROPIONATE 50 MCG/ACT NA SUSP Nasal Place 2 sprays into the nose daily. 16 g 0  . PSEUDOEPHEDRINE-GUAIFENESIN ER [phone] MG PO TB12 Oral Take 1 tablet by mouth 2 (two) times daily. 20 each 0    BP 128/72  Pulse 94  Temp(Src) 98 F (36.7 C) (Oral)  Resp 18  SpO2 98%  LMP 02/14/2012  Physical Exam  Nursing note and vitals reviewed. Constitutional: She is oriented to person, place, and time. She appears well-developed and well-nourished. No distress.  HENT:  Head: Normocephalic and atraumatic. No trismus in the jaw.  Right Ear: Tympanic membrane normal.  Left Ear: Tympanic membrane normal.  Nose: Mucosal edema and rhinorrhea present. Right sinus exhibits maxillary sinus tenderness. Right sinus exhibits no frontal sinus tenderness. Left sinus exhibits maxillary sinus tenderness.  Left sinus exhibits no frontal sinus tenderness.  Mouth/Throat: Uvula is midline and oropharynx is clear and moist. No oral lesions. Normal dentition.         2 mm erythematous tender area of induration with small central whitehead on lip. No central fluctuance. No blisters. No intraoral lesions  Eyes: Conjunctivae and EOM are normal.  Neck: Normal range of motion.  Cardiovascular: Normal rate.   Pulmonary/Chest: Effort normal.  Abdominal: She exhibits no distension.  Musculoskeletal: Normal range of motion.  Lymphadenopathy:    She  has no cervical adenopathy.  Neurological: She is alert and oriented to person, place, and time. No cranial nerve deficit. Coordination and gait normal.  Skin: Skin is warm and dry.  Psychiatric: She has a normal mood and affect. Her behavior is normal. Judgment and thought content normal.    ED Course  Procedures (including critical care time)  Labs Reviewed - No data to display No results found.   1. Facial cellulitis   2. Sinusitis      MDM  No h/o hsv, cold sores. H/o mrsa. 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 No evidence of meningitis, cavernous sinus thrombosis. Home with warm compresses, doxycycline which will also cover mrsa infxn.Pt also with sx suggestive of sinusitis. Has h/o same esp post nasal surgery.   Pt with indications for abx. Will start , doxycycline in addition to flonase, mucinex-d, saline nasal irrigation, increase fluids, tylenol/motrin prn pain. Discussed MDM and plan with pt. Pt agrees with plan and will f/u with PMD of choice prn will refer to local resources.   Luiz Blare, MD 02/21/12 1640

## 2012-02-21 NOTE — ED Notes (Signed)
Pt  Has  Swelling  To  upperlip  For  About  4  Days         She   Reports   It  Started  Out  As  A  Small  Bump  And  Got  Worse     -  She  Reports  She   Did  However  squezze it  Slightly     - she  Also  Reports  Symptoms  Of  Sinus  Congestion  As  Well      Pt  Is  Awake  As  Well as  Alert and  Oriented     Speaking in  Complete    sentances

## 2013-07-06 ENCOUNTER — Emergency Department: Payer: Self-pay | Admitting: Emergency Medicine

## 2013-07-06 LAB — COMPREHENSIVE METABOLIC PANEL
Anion Gap: 7 (ref 7–16)
BUN: 8 mg/dL (ref 7–18)
Bilirubin,Total: 0.4 mg/dL (ref 0.2–1.0)
Co2: 22 mmol/L (ref 21–32)
EGFR (African American): >60
Osmolality: 269 (ref 275–301)
Potassium: 4.5 mmol/L (ref 3.5–5.1)
SGOT(AST): 37 U/L (ref 15–37)
Sodium: 136 mmol/L (ref 136–145)
Total Protein: 7.2 g/dL (ref 6.4–8.2)

## 2013-07-06 LAB — CBC
HCT: 39.8 % (ref 35.0–47.0)
HGB: 13.5 g/dL (ref 12.0–16.0)
MCH: 29.8 pg (ref 26.0–34.0)
MCHC: 33.8 g/dL (ref 32.0–36.0)
MCV: 88 fL (ref 80–100)
Platelet: 231 10*3/uL (ref 150–440)

## 2013-07-06 LAB — URINALYSIS, COMPLETE
Bacteria: NONE SEEN
Bilirubin,UR: NEGATIVE
Blood: NEGATIVE
Glucose,UR: NEGATIVE mg/dL (ref 0–75)
Ketone: NEGATIVE
Leukocyte Esterase: NEGATIVE
Nitrite: NEGATIVE
Ph: 6 (ref 4.5–8.0)
Specific Gravity: 1.016 (ref 1.003–1.030)
WBC UR: <1 /HPF (ref 0–5)

## 2013-12-04 ENCOUNTER — Ambulatory Visit: Payer: Self-pay | Admitting: Adult Health

## 2013-12-21 ENCOUNTER — Ambulatory Visit (INDEPENDENT_AMBULATORY_CARE_PROVIDER_SITE_OTHER): Payer: BC Managed Care – PPO | Admitting: Adult Health

## 2013-12-21 ENCOUNTER — Encounter: Payer: Self-pay | Admitting: Adult Health

## 2013-12-21 VITALS — BP 120/88 | HR 82 | Temp 97.9°F | Resp 16 | Ht 64.5 in | Wt 237.0 lb

## 2013-12-21 DIAGNOSIS — R232 Flushing: Secondary | ICD-10-CM | POA: Insufficient documentation

## 2013-12-21 DIAGNOSIS — G479 Sleep disorder, unspecified: Secondary | ICD-10-CM

## 2013-12-21 DIAGNOSIS — R5383 Other fatigue: Secondary | ICD-10-CM

## 2013-12-21 DIAGNOSIS — D649 Anemia, unspecified: Secondary | ICD-10-CM

## 2013-12-21 DIAGNOSIS — R5381 Other malaise: Secondary | ICD-10-CM

## 2013-12-21 DIAGNOSIS — N951 Menopausal and female climacteric states: Secondary | ICD-10-CM

## 2013-12-21 LAB — CBC WITH DIFFERENTIAL/PLATELET
BASOS PCT: 0.5 % (ref 0.0–3.0)
Basophils Absolute: 0 10*3/uL (ref 0.0–0.1)
EOS PCT: 2.9 % (ref 0.0–5.0)
Eosinophils Absolute: 0.2 10*3/uL (ref 0.0–0.7)
HCT: 39.5 % (ref 36.0–46.0)
HEMOGLOBIN: 13 g/dL (ref 12.0–15.0)
Lymphocytes Relative: 31.1 % (ref 12.0–46.0)
Lymphs Abs: 2.4 10*3/uL (ref 0.7–4.0)
MCHC: 33 g/dL (ref 30.0–36.0)
MCV: 89.9 fl (ref 78.0–100.0)
MONO ABS: 0.4 10*3/uL (ref 0.1–1.0)
Monocytes Relative: 5.5 % (ref 3.0–12.0)
NEUTROS ABS: 4.6 10*3/uL (ref 1.4–7.7)
NEUTROS PCT: 60 % (ref 43.0–77.0)
Platelets: 248 10*3/uL (ref 150.0–400.0)
RBC: 4.39 Mil/uL (ref 3.87–5.11)
RDW: 13.8 % (ref 11.5–14.6)
WBC: 7.7 10*3/uL (ref 4.5–10.5)

## 2013-12-21 LAB — VITAMIN B12: Vitamin B-12: 208 pg/mL — ABNORMAL LOW (ref 211–911)

## 2013-12-21 LAB — FOLLICLE STIMULATING HORMONE: FSH: 6.4 m[IU]/mL

## 2013-12-21 LAB — TSH: TSH: 0.62 u[IU]/mL (ref 0.35–5.50)

## 2013-12-21 MED ORDER — SUVOREXANT 10 MG PO TABS: 10.0000 mg | ORAL_TABLET | Freq: Every evening | ORAL | Status: DC | PRN

## 2013-12-21 NOTE — Progress Notes (Signed)
Pre visit review using our clinic review tool, if applicable. No additional management support is needed unless otherwise documented below in the visit note. 

## 2013-12-21 NOTE — Progress Notes (Signed)
Patient ID: Valerie Morales, female   DOB: 10/29/1975, 38 y.o.   MRN: [id number]    Subjective:    Patient ID: Valerie Morales, female    DOB: [birth year], 38 y.o.   MRN: [id number]  HPI  Valerie Morales is a pleasant 38 y/o female who presents to clinic to establish care. She has not had a PCP in several years. She reports hx of anemia s/p 2 units PRBC. At the time this was occurring she was having heavy menstrual bleeding. She is s/p ablation. She is feeling fatigue and sluggish.   She also suffers from allergies which are worse during peak allergy seasons such as spring and fall. But she does have symptoms year round. Sinus pressure. She is s/p sinus surgery but did not notice much improvement.  Valerie Morales is having trouble with sleep. She is able to fall a sleep but cannot stay asleep. Experiencing hot flashes during the night that she reports are very uncomfortable and contribute to her inability to return to sleep.    Past Medical History  Diagnosis Date  . Sinusitis   . MRSA (methicillin resistant Staphylococcus aureus)     Current Outpatient Prescriptions on File Prior to Visit  Medication Sig Dispense Refill  . Pseudoephedrine-Guaifenesin (MUCINEX D) [phone] MG TB12 Take 1 tablet by mouth 2 (two) times daily.  20 each  0  . fluticasone (FLONASE) 50 MCG/ACT nasal spray Place 2 sprays into the nose daily.  16 g  0   No current facility-administered medications on file prior to visit.     Review of Systems  Constitutional: Positive for fatigue.  HENT: Positive for sinus pressure.   Eyes: Negative.   Respiratory: Negative.   Cardiovascular: Negative.   Gastrointestinal: Negative.   Endocrine: Negative.   Genitourinary: Negative.        Hot flashes - very uncomfortable  Musculoskeletal: Negative.   Skin: Negative.   Allergic/Immunologic: Positive for environmental allergies and food allergies.  Neurological: Negative.   Hematological: Negative.   Psychiatric/Behavioral:  Positive for sleep disturbance.       Objective:  There were no vitals taken for this visit.   Physical Exam  Constitutional: She is oriented to person, place, and time. She appears well-developed and well-nourished. No distress.  HENT:  Head: Normocephalic and atraumatic.  Right Ear: External ear normal.  Left Ear: External ear normal.  Nose: Nose normal.  Mouth/Throat: Oropharynx is clear and moist.  Eyes: Conjunctivae and EOM are normal.  Neck: Normal range of motion. Neck supple. No tracheal deviation present.  Cardiovascular: Normal rate, regular rhythm, normal heart sounds and intact distal pulses.  Exam reveals no gallop and no friction rub.   No murmur heard. Pulmonary/Chest: Effort normal and breath sounds normal. No respiratory distress. She has no wheezes. She has no rales.  Abdominal: She exhibits no mass.  Musculoskeletal: Normal range of motion.  Neurological: She is alert and oriented to person, place, and time. She has normal reflexes. Coordination normal.  Skin: Skin is warm and dry.  Psychiatric: She has a normal mood and affect. Her behavior is normal. Judgment and thought content normal.       Assessment & Plan:   1. Fatigue Pt with hx of anemia. Will check labs including cbc, tsh, vit d and b12 - TSH - CBC with Differential - Vitamin B12 - Vit D  25 hydroxy (rtn osteoporosis monitoring)  2. Hot flashes ?perimenopause. Check FSH - FSH  3. Sleep disturbance Hot flashes contributing to her inability  to fall back to sleep. She has tried [place] but did not like it. Recommend light clothing, fan. Trial of belsomra. If this works for her I will send in a prescription.

## 2013-12-21 NOTE — Patient Instructions (Signed)
   Thank you for choosing [place] [facility] for your health care needs.  Please have your labs drawn prior to leaving the office.  The results will be available through MyChart for your convenience. Please remember to activate this. The activation code is located at the end of this form.  For your sinuses:   Start Dymista 1 spray into each nostril twice a day  Irrigate your sinuses daily for 1 week then do 1-2 times weekly  A cool mist humidifier may improve your symptoms   For your trouble sleeping:   I am giving you a prescription for a 10 day free trial of Belsomra  I have also given you a discount card should you find that the medication works and you   want to continue you can pay a lesser amount

## 2013-12-22 LAB — VITAMIN D 25 HYDROXY (VIT D DEFICIENCY, FRACTURES): VIT D 25 HYDROXY: 32 ng/mL (ref 30–89)

## 2013-12-24 ENCOUNTER — Telehealth: Payer: Self-pay | Admitting: *Deleted

## 2013-12-24 NOTE — Telephone Encounter (Signed)
Advised pt of B12 being low and the schedule of B12 injections she needs to receive.  Scheduled her first injection in the office for tomorrow morning

## 2013-12-24 NOTE — Telephone Encounter (Signed)
Message copied by Vernetta Honey on Mon Dec 24, 2013  2:15 PM ------      Message from: Sherryl Barters      Created: Sat Dec 22, 2013 11:11 AM       B12 low. Needs replacement: Week #1 1000 mcg IM x 3; Weeks #2-4 1000 mcg IM weekly then she will have 1000 mcg IM monthly. We will recheck her levels in 6 months. All other labs normal ------

## 2013-12-25 ENCOUNTER — Ambulatory Visit (INDEPENDENT_AMBULATORY_CARE_PROVIDER_SITE_OTHER): Payer: BC Managed Care – PPO | Admitting: *Deleted

## 2013-12-25 DIAGNOSIS — E538 Deficiency of other specified B group vitamins: Secondary | ICD-10-CM

## 2013-12-25 MED ORDER — CYANOCOBALAMIN 1000 MCG/ML IJ SOLN
1000.0000 ug | Freq: Once | INTRAMUSCULAR | Status: AC
  Administered 2013-12-25: 1000 ug via INTRAMUSCULAR

## 2013-12-27 ENCOUNTER — Ambulatory Visit (INDEPENDENT_AMBULATORY_CARE_PROVIDER_SITE_OTHER): Payer: BC Managed Care – PPO | Admitting: *Deleted

## 2013-12-27 DIAGNOSIS — E538 Deficiency of other specified B group vitamins: Secondary | ICD-10-CM

## 2013-12-27 MED ORDER — CYANOCOBALAMIN 1000 MCG/ML IJ SOLN
1000.0000 ug | Freq: Once | INTRAMUSCULAR | Status: AC
  Administered 2013-12-27: 1000 ug via INTRAMUSCULAR

## 2013-12-28 ENCOUNTER — Ambulatory Visit (INDEPENDENT_AMBULATORY_CARE_PROVIDER_SITE_OTHER): Payer: BC Managed Care – PPO | Admitting: *Deleted

## 2013-12-28 DIAGNOSIS — E538 Deficiency of other specified B group vitamins: Secondary | ICD-10-CM

## 2013-12-28 MED ORDER — CYANOCOBALAMIN 1000 MCG/ML IJ SOLN
1000.0000 ug | Freq: Once | INTRAMUSCULAR | Status: AC
  Administered 2013-12-28: 1000 ug via INTRAMUSCULAR

## 2013-12-31 LAB — HM PAP SMEAR: HM PAP: NORMAL

## 2014-01-02 ENCOUNTER — Ambulatory Visit (INDEPENDENT_AMBULATORY_CARE_PROVIDER_SITE_OTHER): Payer: BC Managed Care – PPO | Admitting: Adult Health

## 2014-01-02 ENCOUNTER — Encounter: Payer: Self-pay | Admitting: Adult Health

## 2014-01-02 ENCOUNTER — Other Ambulatory Visit (HOSPITAL_COMMUNITY)
Admission: RE | Admit: 2014-01-02 | Discharge: 2014-01-02 | Disposition: A | Discharge status: Home / Self Care | Payer: BC Managed Care – PPO | Source: Ambulatory Visit | Attending: Adult Health | Admitting: Adult Health

## 2014-01-02 VITALS — BP 102/80 | HR 88 | Temp 97.9°F | Wt 238.0 lb

## 2014-01-02 DIAGNOSIS — Z0000 Encounter for general adult medical examination without abnormal findings: Secondary | ICD-10-CM

## 2014-01-02 DIAGNOSIS — Z1151 Encounter for screening for human papillomavirus (HPV): Secondary | ICD-10-CM | POA: Insufficient documentation

## 2014-01-02 DIAGNOSIS — N898 Other specified noninflammatory disorders of vagina: Secondary | ICD-10-CM | POA: Insufficient documentation

## 2014-01-02 DIAGNOSIS — G479 Sleep disorder, unspecified: Secondary | ICD-10-CM

## 2014-01-02 DIAGNOSIS — E538 Deficiency of other specified B group vitamins: Secondary | ICD-10-CM

## 2014-01-02 DIAGNOSIS — Z01419 Encounter for gynecological examination (general) (routine) without abnormal findings: Secondary | ICD-10-CM | POA: Insufficient documentation

## 2014-01-02 MED ORDER — CYANOCOBALAMIN 1000 MCG/ML IJ SOLN
1000.0000 ug | Freq: Once | INTRAMUSCULAR | Status: AC
Start: 2014-01-02 — End: 2014-01-02
  Administered 2014-01-02: 1000 ug via INTRAMUSCULAR

## 2014-01-02 MED ORDER — TRAZODONE HCL 50 MG PO TABS: ORAL_TABLET | ORAL | Status: DC

## 2014-01-02 NOTE — Addendum Note (Signed)
Addended by: Vernetta Honey on: 01/02/2014 10:55 AM   Modules accepted: Orders

## 2014-01-02 NOTE — Progress Notes (Signed)
Pre visit review using our clinic review tool, if applicable. No additional management support is needed unless otherwise documented below in the visit note. 

## 2014-01-02 NOTE — Progress Notes (Signed)
Patient ID: Valerie Morales, female   DOB: [birth year], 38 y.o.   MRN: [id number]    Subjective:    Patient ID: Valerie Morales, female    DOB: 17-Oct-1976, 38 y.o.   MRN: [id number]  HPI  Pt is a pleasant 38 y/o female who presents to clinic for her yearly physical including breast, pelvic/PAP.  Past Medical History  Diagnosis Date  . Sinusitis   . MRSA (methicillin resistant Staphylococcus aureus)   . Anemia     has required 2 units PRBC  . Depression   . History of blood transfusion 2009    [facility]    Current Outpatient Prescriptions on File Prior to Visit  Medication Sig Dispense Refill  . diphenhydrAMINE (BENADRYL) 25 MG tablet Take 25 mg by mouth daily as needed.      . Multiple Vitamins-Minerals (HAIR/SKIN/NAILS PO) Take 1 tablet by mouth daily.      [name] Kitchen OVER THE COUNTER MEDICATION Take 1 tablet by mouth 3 (three) times daily. Garcinia cambogia      . Suvorexant (BELSOMRA) 10 MG TABS Take 10 mg by mouth at bedtime as needed.  10 tablet  0  . vitamin B-12 (CYANOCOBALAMIN) 50 MCG tablet Take 50 mcg by mouth daily.      . fluticasone (FLONASE) 50 MCG/ACT nasal spray Place 2 sprays into the nose daily.  16 g  0   No current facility-administered medications on file prior to visit.     Review of Systems  Constitutional: Negative.   HENT: Negative.   Eyes: Negative.   Respiratory: Negative.   Cardiovascular: Negative.   Gastrointestinal: Negative.   Endocrine: Negative.   Genitourinary: Negative.   Musculoskeletal: Negative.   Skin: Negative.   Allergic/Immunologic: Negative.   Neurological: Negative.   Hematological: Negative.   Psychiatric/Behavioral: Negative.        Objective:  BP 102/80  Pulse 88  Temp(Src) 97.9 F (36.6 C) (Oral)  Wt 238 lb (107.956 kg)  SpO2 99%  LMP 12/02/2013   Physical Exam  Constitutional: She is oriented to person, place, and time. She appears well-developed and well-nourished. No distress.  HENT:  Head: Normocephalic and  atraumatic.  Right Ear: External ear normal.  Left Ear: External ear normal.  Nose: Nose normal.  Mouth/Throat: Oropharynx is clear and moist.  Eyes: Conjunctivae and EOM are normal. Pupils are equal, round, and reactive to light.  Neck: Normal range of motion. Neck supple. No tracheal deviation present. No thyromegaly present.  Cardiovascular: Normal rate, regular rhythm, normal heart sounds and intact distal pulses.  Exam reveals no gallop and no friction rub.   No murmur heard. Pulmonary/Chest: Effort normal and breath sounds normal. No respiratory distress. She has no wheezes. She has no rales.  Abdominal: Soft. Bowel sounds are normal. She exhibits no distension and no mass. There is no tenderness. There is no rebound and no guarding. Hernia confirmed negative in the right inguinal area and confirmed negative in the left inguinal area.  Genitourinary: Rectum normal. No breast swelling, tenderness, discharge or bleeding. No labial fusion. There is no rash, tenderness, lesion or injury on the right labia. There is no rash, tenderness, lesion or injury on the left labia. Uterus is not deviated, not enlarged, not fixed and not tender. Cervix exhibits discharge. Cervix exhibits no motion tenderness and no friability. Right adnexum displays no mass, no tenderness and no fullness. Left adnexum displays no mass, no tenderness and no fullness. No erythema, tenderness or bleeding around the vagina. No  foreign body around the vagina. No signs of injury around the vagina. Vaginal discharge found.  Musculoskeletal: Normal range of motion. She exhibits no edema and no tenderness.  Lymphadenopathy:    She has no cervical adenopathy.       Right: No inguinal adenopathy present.       Left: No inguinal adenopathy present.  Neurological: She is alert and oriented to person, place, and time. She has normal reflexes. No cranial nerve deficit. Coordination normal.  Skin: Skin is warm and dry.  Psychiatric: She  has a normal mood and affect. Her behavior is normal. Judgment and thought content normal.       Assessment & Plan:   1. Routine general medical examination at a health care facility Normal physical exam. Screenings discussed. External genitalia without lesions ulcerations, inflammation, warts or discharge. There are no external hemorrhoids. No cystocele, rectocele or prolapsed uterus. Speculum examination normal. Cervix without inflammation, lesions, growth, nodules. Discharge noted. Sample obtained. There was no bleeding. Vaginal walls also normal - pink and rugose without inflammation, discharge, ulcers or color changes. Bimanual exam also normal.  No tenderness noted with palpation of the uterus. No adnexal masses appreciated during exam.  - Lipid panel; Future - Comprehensive metabolic panel; Future  2. Vaginal discharge White thick discharge noted at cervix. - WET PREP FOR [place], YEAST, CLUE  3. Sleep disturbance Pt reports that belsomra caused lingering effects that she did not like. Discontinue this medication and try trazodone 25-50 mg at bedtime. Prescription sent to pharmacy

## 2014-01-02 NOTE — Addendum Note (Signed)
Addended by: Karlene Einstein D on: 01/02/2014 11:49 AM   Modules accepted: Orders

## 2014-01-04 LAB — WET PREP BY MOLECULAR PROBE
CANDIDA SPECIES: NEGATIVE
GARDNERELLA VAGINALIS: POSITIVE — AB
TRICHOMONAS VAG: NEGATIVE

## 2014-01-08 ENCOUNTER — Encounter: Payer: Self-pay | Admitting: Adult Health

## 2014-01-08 ENCOUNTER — Other Ambulatory Visit: Payer: Self-pay | Admitting: Adult Health

## 2014-01-08 MED ORDER — METRONIDAZOLE 500 MG PO TABS
500.0000 mg | ORAL_TABLET | Freq: Two times a day (BID) | ORAL | Status: DC
Start: 2014-01-08 — End: 2014-04-12

## 2014-01-09 ENCOUNTER — Other Ambulatory Visit: Payer: Self-pay | Admitting: Adult Health

## 2014-01-09 DIAGNOSIS — D259 Leiomyoma of uterus, unspecified: Secondary | ICD-10-CM

## 2014-01-11 ENCOUNTER — Encounter: Payer: Self-pay | Admitting: Adult Health

## 2014-01-11 ENCOUNTER — Other Ambulatory Visit (INDEPENDENT_AMBULATORY_CARE_PROVIDER_SITE_OTHER): Payer: BC Managed Care – PPO

## 2014-01-11 ENCOUNTER — Ambulatory Visit (INDEPENDENT_AMBULATORY_CARE_PROVIDER_SITE_OTHER): Payer: BC Managed Care – PPO | Admitting: *Deleted

## 2014-01-11 DIAGNOSIS — Z0000 Encounter for general adult medical examination without abnormal findings: Secondary | ICD-10-CM

## 2014-01-11 DIAGNOSIS — E538 Deficiency of other specified B group vitamins: Secondary | ICD-10-CM

## 2014-01-11 LAB — COMPREHENSIVE METABOLIC PANEL
ALK PHOS: 65 U/L (ref 39–117)
ALT: 18 U/L (ref 0–35)
AST: 22 U/L (ref 0–37)
Albumin: 4 g/dL (ref 3.5–5.2)
BUN: 9 mg/dL (ref 6–23)
CO2: 28 meq/L (ref 19–32)
CREATININE: 0.8 mg/dL (ref 0.4–1.2)
Calcium: 9.1 mg/dL (ref 8.4–10.5)
Chloride: 104 mEq/L (ref 96–112)
GFR: 107.56 mL/min (ref >60.00)
GLUCOSE: 83 mg/dL (ref 70–99)
Potassium: 4 mEq/L (ref 3.5–5.1)
Sodium: 138 mEq/L (ref 135–145)
Total Bilirubin: 0.4 mg/dL (ref 0.3–1.2)
Total Protein: 6.7 g/dL (ref 6.0–8.3)

## 2014-01-11 LAB — LIPID PANEL
Cholesterol: 179 mg/dL (ref 0–200)
HDL: 60.9 mg/dL (ref >39.00)
LDL Cholesterol: 104 mg/dL — ABNORMAL HIGH (ref 0–99)
TRIGLYCERIDES: 71 mg/dL (ref 0.0–149.0)
Total CHOL/HDL Ratio: 3
VLDL: 14.2 mg/dL (ref 0.0–40.0)

## 2014-01-11 MED ORDER — CYANOCOBALAMIN 1000 MCG/ML IJ SOLN
1000.0000 ug | Freq: Once | INTRAMUSCULAR | Status: AC
  Administered 2014-01-11: 1000 ug via INTRAMUSCULAR

## 2014-01-17 ENCOUNTER — Ambulatory Visit (INDEPENDENT_AMBULATORY_CARE_PROVIDER_SITE_OTHER): Payer: BC Managed Care – PPO | Admitting: *Deleted

## 2014-01-17 DIAGNOSIS — E538 Deficiency of other specified B group vitamins: Secondary | ICD-10-CM

## 2014-01-17 MED ORDER — CYANOCOBALAMIN 1000 MCG/ML IJ SOLN
1000.0000 ug | Freq: Once | INTRAMUSCULAR | Status: AC
  Administered 2014-01-17: 1000 ug via INTRAMUSCULAR

## 2014-02-19 ENCOUNTER — Ambulatory Visit (INDEPENDENT_AMBULATORY_CARE_PROVIDER_SITE_OTHER): Payer: BC Managed Care – PPO | Admitting: *Deleted

## 2014-02-19 DIAGNOSIS — E538 Deficiency of other specified B group vitamins: Secondary | ICD-10-CM

## 2014-02-19 MED ORDER — CYANOCOBALAMIN 1000 MCG/ML IJ SOLN
1000.0000 ug | Freq: Once | INTRAMUSCULAR | Status: AC
  Administered 2014-02-19: 1000 ug via INTRAMUSCULAR

## 2014-03-04 ENCOUNTER — Encounter: Payer: Self-pay | Admitting: Adult Health

## 2014-03-22 ENCOUNTER — Ambulatory Visit (INDEPENDENT_AMBULATORY_CARE_PROVIDER_SITE_OTHER): Payer: BC Managed Care – PPO | Admitting: *Deleted

## 2014-03-22 DIAGNOSIS — E538 Deficiency of other specified B group vitamins: Secondary | ICD-10-CM

## 2014-03-22 MED ORDER — CYANOCOBALAMIN 1000 MCG/ML IJ SOLN
1000.0000 ug | Freq: Once | INTRAMUSCULAR | Status: AC
  Administered 2014-03-22: 1000 ug via INTRAMUSCULAR

## 2014-04-12 ENCOUNTER — Encounter: Payer: Self-pay | Admitting: Adult Health

## 2014-04-12 ENCOUNTER — Ambulatory Visit (INDEPENDENT_AMBULATORY_CARE_PROVIDER_SITE_OTHER): Payer: BC Managed Care – PPO | Admitting: Adult Health

## 2014-04-12 VITALS — BP 120/80 | HR 95 | Temp 98.2°F | Resp 16 | Ht 64.5 in | Wt 231.8 lb

## 2014-04-12 DIAGNOSIS — R5381 Other malaise: Secondary | ICD-10-CM

## 2014-04-12 DIAGNOSIS — T7840XA Allergy, unspecified, initial encounter: Secondary | ICD-10-CM

## 2014-04-12 DIAGNOSIS — R5383 Other fatigue: Secondary | ICD-10-CM

## 2014-04-12 LAB — IRON: IRON: 92 ug/dL (ref 42–145)

## 2014-04-12 LAB — FERRITIN: FERRITIN: 12.7 ng/mL (ref 10.0–291.0)

## 2014-04-12 MED ORDER — PREDNISONE 10 MG PO TABS: ORAL_TABLET | ORAL | Status: DC

## 2014-04-12 NOTE — Progress Notes (Signed)
Pre visit review using our clinic review tool, if applicable. No additional management support is needed unless otherwise documented below in the visit note. 

## 2014-04-12 NOTE — Progress Notes (Signed)
Patient ID: Valerie Morales, female   DOB: October 30, 1975, 38 y.o.   MRN: [id number]   Subjective:    Patient ID: Valerie Morales, female    DOB: [birth year], 38 y.o.   MRN: [id number]  HPI  Multiple allergy hx. Has been to allergy testing and treatment in the past. She had to stop 2/2 to losing insurance. She has been feeling fatigue. Monday had "itching all over". No breathing difficulties. Has sneezing, nasal congestion, sinus pressure, popping of the right ear, "feels like I have water". No fever, chills. Hx of iron def. She is currently getting B12 injections for deficiency.   Past Medical History  Diagnosis Date  . Sinusitis   . MRSA (methicillin resistant Staphylococcus aureus)   . Anemia     has required 2 units PRBC  . Depression   . History of blood transfusion 2009    [facility]    Current Outpatient Prescriptions on File Prior to Visit  Medication Sig Dispense Refill  . diphenhydrAMINE (BENADRYL) 25 MG tablet Take 25 mg by mouth daily as needed.      . Multiple Vitamins-Minerals (HAIR/SKIN/NAILS PO) Take 1 tablet by mouth daily.      . traZODone (DESYREL) 50 MG tablet Take 1/2 tablet to 1 tablet at bedtime for sleep  30 tablet  3   No current facility-administered medications on file prior to visit.     Review of Systems  Constitutional: Positive for fatigue. Negative for fever and chills.  HENT: Positive for congestion, postnasal drip, rhinorrhea, sinus pressure and sneezing. Negative for sore throat.   Respiratory: Negative for cough and wheezing.   All other systems reviewed and are negative.      Objective:  BP 120/80  Pulse 95  Temp(Src) 98.2 F (36.8 C) (Oral)  Resp 16  Ht 5' 4.5" (1.638 m)  Wt 231 lb 12 oz (105.121 kg)  BMI 39.18 kg/m2  SpO2 97%   Physical Exam  Constitutional: She is oriented to person, place, and time. She appears well-developed and well-nourished. No distress.  HENT:  Head: Normocephalic and atraumatic.  Right Ear: External ear  normal.  Left Ear: External ear normal.  Mouth/Throat: Oropharynx is clear and moist. No oropharyngeal exudate.  Pt sounds nasaly  Cardiovascular: Normal rate, regular rhythm and normal heart sounds.  Exam reveals no gallop.   No murmur heard. Pulmonary/Chest: Effort normal and breath sounds normal. No respiratory distress. She has no wheezes. She has no rales.  Lymphadenopathy:    She has no cervical adenopathy.  Neurological: She is alert and oriented to person, place, and time.  Psychiatric: She has a normal mood and affect. Her behavior is normal. Judgment and thought content normal.      Assessment & Plan:   1. Other fatigue Check iron with previous hx of deficiency. - Ferritin - Iron  2. Allergy, initial encounter Refer to [place] [facility]. Pt is interested in pursuing further allergy testing and treatment. - Ambulatory referral to Allergy

## 2014-04-12 NOTE — Patient Instructions (Signed)
  Start prednisone taper as follows:  Day #1 - take 6 tablets Day #2 - take 5 tablets Day #3 - take 4 tablets Day #4 - take 3 tablets Day #5 - take 2 tablets Day #6 - take 1 tablet  I am referring you to [facility]. Please see Hoyle Sauer prior to leaving the office.

## 2014-04-23 ENCOUNTER — Ambulatory Visit (INDEPENDENT_AMBULATORY_CARE_PROVIDER_SITE_OTHER): Payer: BC Managed Care – PPO | Admitting: *Deleted

## 2014-04-23 DIAGNOSIS — E538 Deficiency of other specified B group vitamins: Secondary | ICD-10-CM

## 2014-04-23 MED ORDER — CYANOCOBALAMIN 1000 MCG/ML IJ SOLN
1000.0000 ug | Freq: Once | INTRAMUSCULAR | Status: AC
  Administered 2014-04-23: 1000 ug via INTRAMUSCULAR

## 2014-05-28 ENCOUNTER — Ambulatory Visit: Payer: BC Managed Care – PPO

## 2014-06-04 ENCOUNTER — Ambulatory Visit: Payer: BC Managed Care – PPO

## 2014-06-28 ENCOUNTER — Telehealth: Payer: Self-pay | Admitting: Adult Health

## 2014-06-28 NOTE — Telephone Encounter (Signed)
LMTCB to make appt for recheck B12 and discuss other issues as per mychart msg.

## 2014-07-04 ENCOUNTER — Other Ambulatory Visit: Payer: Self-pay | Admitting: Adult Health

## 2014-07-05 ENCOUNTER — Ambulatory Visit (INDEPENDENT_AMBULATORY_CARE_PROVIDER_SITE_OTHER): Payer: BC Managed Care – PPO | Admitting: Adult Health

## 2014-07-05 ENCOUNTER — Ambulatory Visit (INDEPENDENT_AMBULATORY_CARE_PROVIDER_SITE_OTHER)
Admission: RE | Admit: 2014-07-05 | Discharge: 2014-07-05 | Disposition: A | Discharge status: Home / Self Care | Payer: BC Managed Care – PPO | Source: Ambulatory Visit | Attending: Adult Health | Admitting: Adult Health

## 2014-07-05 ENCOUNTER — Encounter: Payer: Self-pay | Admitting: Adult Health

## 2014-07-05 VITALS — BP 127/87 | HR 77 | Temp 98.5°F | Resp 14 | Ht 64.5 in | Wt 233.5 lb

## 2014-07-05 DIAGNOSIS — M545 Low back pain, unspecified: Secondary | ICD-10-CM

## 2014-07-05 DIAGNOSIS — E538 Deficiency of other specified B group vitamins: Secondary | ICD-10-CM

## 2014-07-05 DIAGNOSIS — N926 Irregular menstruation, unspecified: Secondary | ICD-10-CM

## 2014-07-05 DIAGNOSIS — Z79899 Other long term (current) drug therapy: Secondary | ICD-10-CM

## 2014-07-05 LAB — BASIC METABOLIC PANEL
BUN: 10 mg/dL (ref 6–23)
CO2: 28 meq/L (ref 19–32)
Calcium: 9.1 mg/dL (ref 8.4–10.5)
Chloride: 103 mEq/L (ref 96–112)
Creatinine, Ser: 0.6 mg/dL (ref 0.4–1.2)
GFR: 132.81 mL/min (ref >60.00)
Glucose, Bld: 90 mg/dL (ref 70–99)
Potassium: 3.9 mEq/L (ref 3.5–5.1)
Sodium: 139 mEq/L (ref 135–145)

## 2014-07-05 LAB — FOLLICLE STIMULATING HORMONE: FSH: 7.9 m[IU]/mL

## 2014-07-05 LAB — VITAMIN B12: Vitamin B-12: 228 pg/mL (ref 211–911)

## 2014-07-05 LAB — LUTEINIZING HORMONE: LH: 4.58 m[IU]/mL

## 2014-07-05 MED ORDER — CYANOCOBALAMIN 1000 MCG/ML IJ SOLN
1000.0000 ug | Freq: Once | INTRAMUSCULAR | Status: AC
  Administered 2014-07-05: 1000 ug via INTRAMUSCULAR

## 2014-07-05 MED ORDER — CYCLOBENZAPRINE HCL 5 MG PO TABS: 5.0000 mg | ORAL_TABLET | Freq: Three times a day (TID) | ORAL | Status: DC | PRN

## 2014-07-05 MED ORDER — TRAZODONE HCL 50 MG PO TABS: ORAL_TABLET | ORAL | Status: DC

## 2014-07-05 NOTE — Patient Instructions (Signed)
  Please have labs drawn prior to leaving.  I am prescribing flexeril for back pain/muscle spasms. Do not drive if taking this medication as it will cause sedation.  Apply ice alternating with heat to the area. Do this for 15 min 3-4 times a day.  Tylenol for pain. Do not take more than 4000 mg in a 24 hour period.  You have been taking too much ibuprofen which can be harsh on your kidneys. Please do not take this medication. Drink plenty of water.

## 2014-07-05 NOTE — Progress Notes (Signed)
Pre visit review using our clinic review tool, if applicable. No additional management support is needed unless otherwise documented below in the visit note. 

## 2014-07-05 NOTE — Progress Notes (Signed)
Patient ID: Valerie Morales, female   DOB: 1976/02/06, 38 y.o.   MRN: [id number]   Subjective:    Patient ID: Valerie Morales, female    DOB: 1975-11-05, 38 y.o.   MRN: [id number]  HPI Pt is a 38 y/o female who presents to clinic after scheduling her own appointment through [facility] for the following:  1) b12 def follow up. She has not been coming in for her B12 injections. She did receive the initial 3 in the first week and then 3 more for the next 3 weeks. She is requesting that her levels be rechecked.  2) Low back pain - started 3 weeks ago. No radiating pain. At the time she reports that she was doing house work, picking things up off the floor. She has been taking ibuprofen and tylenol which she reports helped for a little while. She was taking ibuprofen 800 mg approximately 5 times a day. Pain worse with sitting and standing. Lying relieves the pain. Denies pain with urination or hematuria. No fever or chills. 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 No hx of back problems.  3) She has missed the last 3 periods and is wondering if she may be perimenopausal. She is s/p tubal ligation. No cramping or other symptoms reported. Pt is followed by a GYN.   Current Outpatient Prescriptions on File Prior to Visit  Medication Sig Dispense Refill  . diphenhydrAMINE (BENADRYL) 25 MG tablet Take 25 mg by mouth daily as needed.      . traZODone (DESYREL) 50 MG tablet Take 1/2 tablet to 1 tablet at bedtime for sleep  30 tablet  3  . Multiple Vitamins-Minerals (HAIR/SKIN/NAILS PO) Take 1 tablet by mouth daily.       No current facility-administered medications on file prior to visit.     Review of Systems  Respiratory: Negative.   Cardiovascular: Negative.   Gastrointestinal: Negative.   Genitourinary: Negative.  Negative for dysuria, urgency, frequency and hematuria.  Musculoskeletal: Positive for back pain (low back pain. Pain does not radiate).  Psychiatric/Behavioral: Positive  for sleep disturbance (doing well with trazodone).       Objective:  There were no vitals taken for this visit.   Physical Exam  Constitutional: She is oriented to person, place, and time. No distress.  Cardiovascular: Normal rate and regular rhythm.   Pulmonary/Chest: Effort normal. No respiratory distress.  Musculoskeletal:  No guarded movements observed. ROM limited with bending at the waist. She cannot touch her toes.  Neurological: She is alert and oriented to person, place, and time.  Skin: Skin is warm and dry.  Psychiatric: She has a normal mood and affect. Her behavior is normal. Judgment and thought content normal.      Assessment & Plan:   1. B12 deficiency Check labs. If improved will have her take oral supplements. - Vitamin B12   2. Missed period Discussed period of perimenopause that is approximately 10 years prior to menopause. Check labs. May need to see her GYN - [place] - [facility]  3. Medication management Refill trazodone. Check kidney function secondary to taking high doses of ibuprofen - Basic metabolic panel  4. Mid low back pain without sciatica Discussed that ibuprofen 800 mg should only be taken 2 times daily for short period of time (4-5 days) as this can be harsh on her kidneys. Will send for lumbar xray. Trial of flexeril. Will make her sleepy so advised to only take at bedtime. May benefit from  prednisone taper but will wait to see results of xray. RTC if no improvement within 1-2 weeks or sooner if necessary.

## 2014-07-09 ENCOUNTER — Telehealth: Payer: Self-pay | Admitting: *Deleted

## 2014-07-09 ENCOUNTER — Other Ambulatory Visit: Payer: Self-pay | Admitting: Internal Medicine

## 2014-07-09 DIAGNOSIS — M545 Low back pain, unspecified: Secondary | ICD-10-CM

## 2014-07-09 MED ORDER — CYCLOBENZAPRINE HCL 5 MG PO TABS: 5.0000 mg | ORAL_TABLET | Freq: Three times a day (TID) | ORAL | Status: DC | PRN

## 2014-07-09 NOTE — Telephone Encounter (Signed)
Pt called requesting Xray results from 9.18.15.  Please advise

## 2014-07-09 NOTE — Telephone Encounter (Signed)
We can do both. Referral in process and refill sent

## 2014-07-09 NOTE — Telephone Encounter (Signed)
Her lumbar spine films were negative for acute fractures but suggest mild disk herniation at L5 level.  Valerie Morales's notes report no radiating pain so MRi is not necessary yet.  If she would like PT referral I will setup

## 2014-07-09 NOTE — Telephone Encounter (Signed)
Asa Lente,  This is raquel's patient,  If you see this kind of request please try to find the filsm since I have not seen them

## 2014-07-09 NOTE — Telephone Encounter (Signed)
Spoke with pt, advised of results and MDs message.  Pt would like to be referred to PT.  Pt also requests if PT is not approved by insurance may she be able to a refill on Flexeril.  Please advise

## 2014-07-09 NOTE — Telephone Encounter (Signed)
Report printed from [facility], placed on counter for review.

## 2014-07-10 NOTE — Telephone Encounter (Signed)
Spoke with pt advised of referral and refill.

## 2014-07-12 ENCOUNTER — Telehealth: Payer: Self-pay | Admitting: *Deleted

## 2014-07-12 MED ORDER — PREDNISONE (PAK) 10 MG PO TABS: ORAL_TABLET | ORAL | Status: DC

## 2014-07-12 NOTE — Telephone Encounter (Signed)
Pt called states the Flexeril nor Tylenol is not effective on back pain.  Pt wants to know what else can be taken.  Please advise

## 2014-07-12 NOTE — Telephone Encounter (Signed)
Raquel discussed a prednisone taper,  Instead of ibuprofem,.  I will call it in,  If it does not help she'll need to scheudle an appt or accept PT referral

## 2014-07-12 NOTE — Telephone Encounter (Signed)
Spoke with pt, advised of MDs message.  Pt verbalized understanding 

## 2014-07-18 ENCOUNTER — Other Ambulatory Visit: Payer: Self-pay | Admitting: Internal Medicine

## 2014-07-18 NOTE — Telephone Encounter (Signed)
Refill

## 2014-07-22 MED ORDER — CYCLOBENZAPRINE HCL 5 MG PO TABS
5.0000 mg | ORAL_TABLET | Freq: Three times a day (TID) | ORAL | Status: DC | PRN
Start: 2014-07-22 — End: 2017-11-28

## 2014-07-22 NOTE — Telephone Encounter (Signed)
Ok to refill,  printed rx  

## 2014-08-20 ENCOUNTER — Telehealth: Payer: Self-pay | Admitting: *Deleted

## 2014-08-21 NOTE — Telephone Encounter (Signed)
error 

## 2015-04-14 ENCOUNTER — Emergency Department: Payer: Self-pay

## 2015-04-14 ENCOUNTER — Encounter: Payer: Self-pay | Admitting: Emergency Medicine

## 2015-04-14 ENCOUNTER — Emergency Department
Admission: EM | Admit: 2015-04-14 | Discharge: 2015-04-14 | Disposition: A | Discharge status: Home / Self Care | Payer: Self-pay | Attending: Emergency Medicine | Admitting: Emergency Medicine

## 2015-04-14 DIAGNOSIS — M25572 Pain in left ankle and joints of left foot: Secondary | ICD-10-CM

## 2015-04-14 DIAGNOSIS — Y9389 Activity, other specified: Secondary | ICD-10-CM | POA: Insufficient documentation

## 2015-04-14 DIAGNOSIS — S93402A Sprain of unspecified ligament of left ankle, initial encounter: Secondary | ICD-10-CM | POA: Insufficient documentation

## 2015-04-14 DIAGNOSIS — Y998 Other external cause status: Secondary | ICD-10-CM | POA: Insufficient documentation

## 2015-04-14 DIAGNOSIS — X58XXXA Exposure to other specified factors, initial encounter: Secondary | ICD-10-CM | POA: Insufficient documentation

## 2015-04-14 DIAGNOSIS — Y9289 Other specified places as the place of occurrence of the external cause: Secondary | ICD-10-CM | POA: Insufficient documentation

## 2015-04-14 MED ORDER — TRAMADOL HCL 50 MG PO TABS: 50.0000 mg | ORAL_TABLET | Freq: Three times a day (TID) | ORAL | Status: DC | PRN

## 2015-04-14 NOTE — ED Provider Notes (Signed)
[facility] Emergency Department Provider Note  ____________________________________________  Time seen: Approximately 12:40 PM  I have reviewed the triage vital signs and the nursing notes.   HISTORY  Chief Complaint Foot Pain   HPI Valerie Morales is a 39 y.o. female presents to ER for complaint of left lateral ankle pain x 4 weeks. Pt states unsure of injury but has continued to walk and has not rested ankle.   States pain is currently 6 out of 10 to lateral left ankle. Denies swelling or redness. She reports pain present intermittently 4 weeks. Again denies fall or injury. Reports able to continue to ambulate however with pain. Denies pain radiation. Denies calf pain. Denies other complaints.   Past Medical History  Diagnosis Date  . Sinusitis   . MRSA (methicillin resistant Staphylococcus aureus)   . Anemia     has required 2 units PRBC  . Depression   . History of blood transfusion 2009    [place]    Patient Active Problem List   Diagnosis Date Noted  . Routine general medical examination at a health care facility 01/02/2014  . Vaginal discharge 01/02/2014  . Fatigue 12/21/2013  . Hot flashes 12/21/2013  . Sleep disturbance 12/21/2013    Past Surgical History  Procedure Laterality Date  . Nasal sinus surgery    . Cesarean section    . Uterine ablation  2010  . Gastric bypass    . Cholecystectomy  1998    Current Outpatient Rx  Name  Route  Sig  Dispense  Refill  .         1     May refill on or after Oct 18   . diphenhydrAMINE (BENADRYL) 25 MG tablet   Oral   Take 25 mg by mouth daily as needed.         .           .           .             Allergies Aspirin; Penicillins; Percocet; and Latex  Family History  Problem Relation Age of Onset  . Cancer Mother 36    lung cancer  . Heart disease Mother     MI with stents  . Hypertension Mother   . Diabetes Mother   . Hypertension Father   . Cancer Maternal Aunt      breast cancer  . Cancer Maternal Uncle     colon cancer  . Alcohol abuse Maternal Grandfather   . Arthritis Paternal Grandmother   . Cancer Paternal Grandmother     breast cancer  . Hypertension Paternal Grandmother   . Alcohol abuse Maternal Aunt     Liver Failure    Social History History  Substance Use Topics  . Smoking status: Never Smoker   . Smokeless tobacco: Never Used  . Alcohol Use: Yes     Comment: 1 drink weekly    Review of Systems Constitutional: No fever/chills Eyes: No visual changes. ENT: No sore throat. Cardiovascular: Denies chest pain. Respiratory: Denies shortness of breath. Gastrointestinal: No abdominal pain.  No nausea, no vomiting.  No diarrhea.  No constipation. Genitourinary: Negative for dysuria. Musculoskeletal: Negative for back pain. Positive left ankle pain Skin: Negative for rash. Neurological: Negative for headaches, focal weakness or numbness.  10-point ROS otherwise negative.  ____________________________________________   PHYSICAL EXAM:  VITAL SIGNS: ED Triage Vitals  Enc Vitals Group     BP  04/14/15 1121 147/88 mmHg     Pulse Rate 04/14/15 1121 81     Resp 04/14/15 1121 17     Temp 04/14/15 1121 97.7 F (36.5 C)     Temp Source 04/14/15 1121 Oral     SpO2 04/14/15 1121 100 %     Weight 04/14/15 1121 238 lb (107.956 kg)     Height 04/14/15 1121 5\' 3"  (1.6 m)     Head Cir --      Peak Flow --      Pain Score 04/14/15 1122 7     Pain Loc --      Pain Edu? --      Excl. in [place]? --     Constitutional: Alert and oriented. Well appearing and in no acute distress. Eyes: Conjunctivae are normal. PERRL. EOMI. Head: Atraumatic. Nose: No congestion/rhinnorhea. Mouth/Throat: Mucous membranes are moist.  Oropharynx non-erythematous. Neck: No stridor.  No cervical spine tenderness to palpation. Hematological/Lymphatic/Immunilogical: No cervical lymphadenopathy. Cardiovascular: Normal rate, regular rhythm. Grossly normal  heart sounds.  Good peripheral circulation. Respiratory: Normal respiratory effort.  No retractions. Lungs CTAB. Gastrointestinal: Soft and nontender. No distention. No abdominal bruits. No CVA tenderness. Musculoskeletal: No lower extremity tenderness nor edema.  No joint effusions. No cervical, thoracic or lumbar tenderness to palpation. Left lateral ankle and proximal foot minimal swelling, mild to mod tenderness palpation. Full range of motion. Pain present with dorsiflexion and rotation. Bilateral pedal pulses equal and easily found. Neurologic:  Normal speech and language. No gross focal neurologic deficits are appreciated. Speech is normal. No gait instability. Skin:  Skin is warm, dry and intact. No rash noted. Psychiatric: Mood and affect are normal. Speech and behavior are normal.  ____________________________________________   RADIOLOGY  EXAM: LEFT ANKLE COMPLETE - 3+ VIEW  COMPARISON: None.  FINDINGS: No evidence of acute fracture. There are old corticated avulsion ends at the tip of the medial malleolus and the tip of lateral malleolus. There is mild degenerative change of the ankle joint with anterior osteophytes.  IMPRESSION: No acute finding. Old corticated avulsion is at the tips of the medial and lateral malleoli. 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   Electronically Signed By: Nelson Chimes M.D. On: 04/14/2015 12:57  I, Marylene Land, personally viewed and evaluated these images as part of my medical decision making.   ____  .________________________________________   PROCEDURES  Procedure(s) performed:  SPLINT APPLICATION Date/Time: 6:72 PM Authorized by: Marylene Land Consent: Verbal consent obtained. Risks and benefits: risks, benefits and alternatives were discussed Consent given by: patient Splint applied by:edc technician Location details: left velcro stirrup Post-procedure: The splinted body part was  neurovascularly unchanged following the procedure. Patient tolerance: Patient tolerated the procedure well with no immediate complications.  Pt refused crutches.   INITIAL IMPRESSION / ASSESSMENT AND PLAN / ED COURSE  Pertinent labs & imaging results that were available during my care of the patient were reviewed by me and considered in my medical decision making (see chart for details). Left ankle pain x one month. No acute findings on xray. No signs of infection. Apply ice and and elevate. Follow up with ortho as needed. Pt agreed to plan.  ____________________________________________   FINAL CLINICAL IMPRESSION(S) / ED DIAGNOSES  Final diagnoses:  Ankle sprain, left, initial encounter  Left ankle pain      Marylene Land, NP 04/14/15 1331  Lisa Roca, MD 04/14/15 [phone]

## 2015-04-14 NOTE — Discharge Instructions (Signed)
Take medication as prescribed. Apply ice and elevate. Rest. Wear splint as long as pain continues.  Follow-up with primary care physician or orthopedic as needed for continued pain. Return to the ER for new or worsening concerns  Ankle Sprain An ankle sprain is an injury to the strong, fibrous tissues (ligaments) that hold the bones of your ankle joint together.  CAUSES An ankle sprain is usually caused by a fall or by twisting your ankle. Ankle sprains most commonly occur when you step on the outer edge of your foot, and your ankle turns inward. People who participate in sports are more prone to these types of injuries.  SYMPTOMS   Pain in your ankle. The pain may be present at rest or only when you are trying to stand or walk.  Swelling.  Bruising. Bruising may develop immediately or within 1 to 2 days after your injury.  Difficulty standing or walking, particularly when turning corners or changing directions. DIAGNOSIS  Your caregiver will ask you details about your injury and perform a physical exam of your ankle to determine if you have an ankle sprain. During the physical exam, your caregiver will press on and apply pressure to specific areas of your foot and ankle. Your caregiver will try to move your ankle in certain ways. An X-ray exam may be done to be sure a bone was not broken or a ligament did not separate from one of the bones in your ankle (avulsion fracture).  TREATMENT  Certain types of braces can help stabilize your ankle. Your caregiver can make a recommendation for this. Your caregiver may recommend the use of medicine for pain. If your sprain is severe, your caregiver may refer you to a surgeon who helps to restore function to parts of your skeletal system (orthopedist) or a physical therapist. [place] ice to your injury for 1-2 days or as directed by your caregiver. Applying ice helps to reduce inflammation and pain.  Put ice in a plastic  bag.  Place a towel between your skin and the bag.  Leave the ice on for 15-20 minutes at a time, every 2 hours while you are awake.  Only take over-the-counter or prescription medicines for pain, discomfort, or fever as directed by your caregiver.  Elevate your injured ankle above the level of your heart as much as possible for 2-3 days.  If your caregiver recommends crutches, use them as instructed. Gradually put weight on the affected ankle. Continue to use crutches or a cane until you can walk without feeling pain in your ankle.  If you have a plaster splint, wear the splint as directed by your caregiver. Do not rest it on anything harder than a pillow for the first 24 hours. Do not put weight on it. Do not get it wet. You may take it off to take a shower or bath.  You may have been given an elastic bandage to wear around your ankle to provide support. If the elastic bandage is too tight (you have numbness or tingling in your foot or your foot becomes cold and blue), adjust the bandage to make it comfortable.  If you have an air splint, you may blow more air into it or let air out to make it more comfortable. You may take your splint off at night and before taking a shower or bath. Wiggle your toes in the splint several times per day to decrease swelling. SEEK MEDICAL CARE IF:   You  have rapidly increasing bruising or swelling.  Your toes feel extremely cold or you lose feeling in your foot.  Your pain is not relieved with medicine. SEEK IMMEDIATE MEDICAL CARE IF:  Your toes are numb or blue.  You have severe pain that is increasing. MAKE SURE YOU:   Understand these instructions.  Will watch your condition.  Will get help right away if you are not doing well or get worse. Document Released: 10/04/2005 Document Revised: 06/28/2012 Document Reviewed: 10/16/2011 [facility] Patient Information 2015 [place], [place]. This information is not intended to replace advice given to you by  your health care provider. Make sure you discuss any questions you have with your health care provider.  Ankle Exercises for Rehabilitation Following ankle injuries, it is as important to follow your caregiver's instructions for regaining full use of your ankle as it was to follow the initial treatment plan following the injury. The following are some suggestions for exercises and treatment, which can be done to help you regain full use of your ankle as soon as possible.  Follow all instructions regarding physical therapy.  Before exercising, it may be helpful to use heat on the muscles or joint being exercised. This loosens up the muscles and tendons (cordlike structure) and decreases chances of injury during your exercises. If this is not possible, just begin your exercises slowly to gradually warm up.  Stand on your toes several times per day to strengthen the calf muscles. These are the muscles in the back of your leg between the knee and the heel. The cord you can feel just above the heel is the Achilles tendon. Rise up on your toes several times repeating this three to four times per day. Do not exercise to the point of pain. If pain starts to develop, decrease the exercise until you are comfortable again.  Do range of motion exercises. This means moving the ankle in all directions. Practice writing the alphabet with your toes in the air. Do not increase beyond a range that is comfortable.  Increase the strength of the muscles in the front of your leg by raising your toes and foot straight up in the air. Repeat this exercise as you did the calf exercise with the same warnings. This also help to stretch your muscles.  Stretch your calf muscles also by leaning against a wall with your hands in front of you. Put your feet a few feet from the wall and bend your knees until you feel the muscles in your calves become tight.  After exercising it may be helpful to put ice on the ankle to prevent  swelling and improve rehabilitation. This may be done for 15 to 20 minutes following your exercises. If exercising is being done in the workplace, this may not always be possible.  Taping an ankle injury may be helpful to give added support following an injury. It also may help prevent reinjury. This may be true if you are in training or in a conditioning program. You and your caregiver can decide on the best course of action to follow. Document Released: 10/01/2000 Document Revised: 02/18/2014 Document Reviewed: 09/28/2008 [facility] Patient Information 2015 [place], [place]. This information is not intended to replace advice given to you by your health care provider. Make sure you discuss any questions you have with your health care provider.  Ankle Pain Ankle pain is a common symptom. The bones, cartilage, tendons, and muscles of the ankle joint perform a lot of work each day. The  ankle joint holds your body weight and allows you to move around. Ankle pain can occur on either side or back of 1 or both ankles. Ankle pain may be sharp and burning or dull and aching. There may be tenderness, stiffness, redness, or warmth around the ankle. The pain occurs more often when a person walks or puts pressure on the ankle. CAUSES  There are many reasons ankle pain can develop. It is important to work with your caregiver to identify the cause since many conditions can impact the bones, cartilage, muscles, and tendons. Causes for ankle pain include:  Injury, including a break (fracture), sprain, or strain often due to a fall, sports, or a high-impact activity.  Swelling (inflammation) of a tendon (tendonitis).  Achilles tendon rupture.  Ankle instability after repeated sprains and strains.  Poor foot alignment.  Pressure on a nerve (tarsal tunnel syndrome).  Arthritis in the ankle or the lining of the ankle.  Crystal formation in the ankle (gout or pseudogout). DIAGNOSIS  A diagnosis is based on your  medical history, your symptoms, results of your physical exam, and results of diagnostic tests. Diagnostic tests may include X-ray exams or a computerized magnetic scan (magnetic resonance imaging, MRI). TREATMENT  Treatment will depend on the cause of your ankle pain and may include:  Keeping pressure off the ankle and limiting activities.  Using crutches or other walking support (a cane or brace).  Using rest, ice, compression, and elevation.  Participating in physical therapy or home exercises.  Wearing shoe inserts or special shoes.  Losing weight.  Taking medications to reduce pain or swelling or receiving an injection.  Undergoing surgery. HOME CARE INSTRUCTIONS   Only take over-the-counter or prescription medicines for pain, discomfort, or fever as directed by your caregiver.  Put ice on the injured area.  Put ice in a plastic bag.  Place a towel between your skin and the bag.  Leave the ice on for 15-20 minutes at a time, 03-04 times a day.  Keep your leg raised (elevated) when possible to lessen swelling.  Avoid activities that cause ankle pain.  Follow specific exercises as directed by your caregiver.  Record how often you have ankle pain, the location of the pain, and what it feels like. This information may be helpful to you and your caregiver.  Ask your caregiver about returning to work or sports and whether you should drive.  Follow up with your caregiver for further examination, therapy, or testing as directed. SEEK MEDICAL CARE IF:   Pain or swelling continues or worsens beyond 1 week.  You have an oral temperature above 102 F (38.9 C).  You are feeling unwell or have chills.  You are having an increasingly difficult time with walking.  You have loss of sensation or other new symptoms.  You have questions or concerns. MAKE SURE YOU:   Understand these instructions.  Will watch your condition.  Will get help right away if you are not doing  well or get worse. Document Released: 03/24/2010 Document Revised: 12/27/2011 Document Reviewed: 03/24/2010 [facility] Patient Information 2015 [place], [place]. This information is not intended to replace advice given to you by your health care provider. Make sure you discuss any questions you have with your health care provider.

## 2015-04-14 NOTE — ED Notes (Signed)
Pt c/o left foot pain for the past month, denies injury.[name] Kitchen

## 2016-05-04 ENCOUNTER — Encounter: Payer: Self-pay | Admitting: Emergency Medicine

## 2016-05-04 ENCOUNTER — Emergency Department
Admission: EM | Admit: 2016-05-04 | Discharge: 2016-05-04 | Disposition: A | Discharge status: Home / Self Care | Payer: Self-pay | Attending: Emergency Medicine | Admitting: Emergency Medicine

## 2016-05-04 DIAGNOSIS — H9201 Otalgia, right ear: Secondary | ICD-10-CM | POA: Insufficient documentation

## 2016-05-04 DIAGNOSIS — Z91040 Latex allergy status: Secondary | ICD-10-CM | POA: Insufficient documentation

## 2016-05-04 DIAGNOSIS — Z7952 Long term (current) use of systemic steroids: Secondary | ICD-10-CM | POA: Insufficient documentation

## 2016-05-04 DIAGNOSIS — Z79899 Other long term (current) drug therapy: Secondary | ICD-10-CM | POA: Insufficient documentation

## 2016-05-04 DIAGNOSIS — F329 Major depressive disorder, single episode, unspecified: Secondary | ICD-10-CM | POA: Insufficient documentation

## 2016-05-04 MED ORDER — NEOMYCIN-POLYMYXIN-HC 3.5-10000-1 OT SOLN: 3.0000 [drp] | Freq: Three times a day (TID) | OTIC | Status: AC

## 2016-05-04 MED ORDER — SULFAMETHOXAZOLE-TRIMETHOPRIM 800-160 MG PO TABS: 1.0000 | ORAL_TABLET | Freq: Two times a day (BID) | ORAL | Status: DC

## 2016-05-04 NOTE — ED Notes (Signed)
Reports right side earache

## 2016-05-04 NOTE — Discharge Instructions (Signed)
Earache An earache, also called otalgia, can be caused by many things. Pain from an earache can be sharp, dull, or burning. The pain may be temporary or constant. Earaches can be caused by problems with the ear, such as infection in either the middle ear or the ear canal, injury, impacted ear wax, middle ear pressure, or a foreign body in the ear. Ear pain can also result from problems in other areas. This is called referred pain. For example, pain can come from a sore throat, a tooth infection, or problems with the jaw or the joint between the jaw and the skull (temporomandibular joint, or TMJ). The cause of an earache is not always easy to identify. Watchful waiting may be appropriate for some earaches until a clear cause of the pain can be found. HOME CARE INSTRUCTIONS Watch your condition for any changes. The following actions may help to lessen any discomfort that you are feeling:  Take medicines only as directed by your health care provider. This includes ear drops.  Apply ice to your outer ear to help reduce pain.  Put ice in a plastic bag.  Place a towel between your skin and the bag.  Leave the ice on for 20 minutes, 2-3 times per day.  Do not put anything in your ear other than medicine that is prescribed by your health care provider.  Try resting in an upright position instead of lying down. This may help to reduce pressure in the middle ear and relieve pain.  Chew gum if it helps to relieve your ear pain.  Control any allergies that you have.  Keep all follow-up visits as directed by your health care provider. This is important. SEEK MEDICAL CARE IF:  Your pain does not improve within 2 days.  You have a fever.  You have new or worsening symptoms. SEEK IMMEDIATE MEDICAL CARE IF:  You have a severe headache.  You have a stiff neck.  You have difficulty swallowing.  You have redness or swelling behind your ear.  You have drainage from your ear.  You have hearing  loss.  You feel dizzy.   This information is not intended to replace advice given to you by your health care provider. Make sure you discuss any questions you have with your health care provider.   Document Released: 05/21/2004 Document Revised: 10/25/2014 Document Reviewed: 05/05/2014 Elsevier Interactive Patient Education 2016 [place] Drops, Adult You need to put eardrops in your ear. HOME CARE   Put drops in your affected ear as told.  After putting in the drops, lie down with the ear you put the drops in facing up. Stay this way for 10 minutes. Use the ear drops as long as your doctor tells you.  Before you get up, put a cotton ball gently in your ear. Do not push it far in your ear.  Do not wash out your ears unless your doctor says it is okay.  Finish all medicines as told by your doctor. You may be told to keep using the eardrops even if you start to feel better.  See your doctor as told for follow-up visits. GET HELP IF:  You have pain that gets worse.  Any unusual fluid (drainage) is coming from your ear (especially if the fluid stinks).  You have trouble hearing.  You get really dizzy as if the room is spinning and feel sick to your stomach (vertigo).  The outside of your ear becomes red or puffy or  both. This may be a sign of an allergic reaction. MAKE SURE YOU:   Understand these instructions.  Will watch your condition.  Will get help right away if you are not doing well or get worse.   This information is not intended to replace advice given to you by your health care provider. Make sure you discuss any questions you have with your health care provider.   Document Released: 03/24/2010 Document Revised: 10/25/2014 Document Reviewed: 05/01/2013 Elsevier Interactive Patient Education [employer].

## 2016-05-04 NOTE — ED Provider Notes (Signed)
[facility] Emergency Department Provider Note  ____________________________________________  Time seen: Approximately 11:37 AM  I have reviewed the triage vital signs and the nursing notes.   HISTORY  Chief Complaint Ear Fullness    HPI Valerie Morales is a 40 y.o. female presents for evaluation of right-sided earache 4 days. Patient states that she's tried over-the-counter ear drops with no relief. States the pain is worse when she is pulmonary or lying on that side. Denies any trauma denies any recent swimming. Denies any decreased hearing. Just has a fullness sensation in her right ear.   Past Medical History  Diagnosis Date  . Sinusitis   . MRSA (methicillin resistant Staphylococcus aureus)   . Anemia     has required 2 units PRBC  . Depression   . History of blood transfusion 2009    [facility]    Patient Active Problem List   Diagnosis Date Noted  . Routine general medical examination at a health care facility 01/02/2014  . Vaginal discharge 01/02/2014  . Fatigue 12/21/2013  . Hot flashes 12/21/2013  . Sleep disturbance 12/21/2013    Past Surgical History  Procedure Laterality Date  . Nasal sinus surgery    . Cesarean section    . Uterine ablation  2010  . Gastric bypass    . Cholecystectomy  1998    Current Outpatient Rx  Name  Route  Sig  Dispense  Refill  . cyclobenzaprine (FLEXERIL) 5 MG tablet   Oral   Take 1 tablet (5 mg total) by mouth 3 (three) times daily as needed for muscle spasms.   90 tablet   1     May refill on or after Oct 18   . diphenhydrAMINE (BENADRYL) 25 MG tablet   Oral   Take 25 mg by mouth daily as needed.         . Multiple Vitamins-Minerals (HAIR/SKIN/NAILS PO)   Oral   Take 1 tablet by mouth daily.         [name] Kitchen neomycin-polymyxin-hydrocortisone (CORTISPORIN) otic solution   Left Ear   Place 3 drops into the left ear 3 (three) times daily.   10 mL   0   . predniSONE (STERAPRED  UNI-PAK) 10 MG tablet      6 tablets on Day 1 , then reduce by 1 tablet daily until gone   21 tablet   0   . sulfamethoxazole-trimethoprim (BACTRIM DS,SEPTRA DS) 800-160 MG tablet   Oral   Take 1 tablet by mouth 2 (two) times daily.   20 tablet   0   . traMADol (ULTRAM) 50 MG tablet   Oral   Take 1 tablet (50 mg total) by mouth every 8 (eight) hours as needed (Do not drive or operate machinery while taking as can cause drowsiness.).   12 tablet   0   . traZODone (DESYREL) 50 MG tablet      Take 1/2 tablet to 1 tablet at bedtime for sleep   30 tablet   6     Allergies Aspirin; Penicillins; Percocet; and Latex  Family History  Problem Relation Age of Onset  . Cancer Mother 36    lung cancer  . Heart disease Mother     MI with stents  . Hypertension Mother   . Diabetes Mother   . Hypertension Father   . Cancer Maternal Aunt     breast cancer  . Cancer Maternal Uncle     colon cancer  .  Alcohol abuse Maternal Grandfather   . Arthritis Paternal Grandmother   . Cancer Paternal Grandmother     breast cancer  . Hypertension Paternal Grandmother   . Alcohol abuse Maternal Aunt     Liver Failure    Social History Social History  Substance Use Topics  . Smoking status: Never Smoker   . Smokeless tobacco: Never Used  . Alcohol Use: Yes     Comment: 1 drink weekly    Review of Systems Constitutional: No fever/chills ENT: No sore throat.Positive for right ear pain. Cardiovascular: Denies chest pain. Respiratory: Denies shortness of breath. Gastrointestinal: No abdominal pain.  No nausea, no vomiting.  No diarrhea.  No constipation. Genitourinary: Negative for dysuria. Musculoskeletal: Negative for back pain. Skin: Negative for rash. Neurological: Negative for headaches, focal weakness or numbness.  10-point ROS otherwise negative.  ____________________________________________   PHYSICAL EXAM:  VITAL SIGNS: ED Triage Vitals  Enc Vitals Group     BP  05/04/16 1118 144/102 mmHg     Pulse Rate 05/04/16 1118 95     Resp --      Temp 05/04/16 1118 98.5 F (36.9 C)     Temp Source 05/04/16 1118 Oral     SpO2 05/04/16 1118 97 %     Weight 05/04/16 1118 255 lb (115.667 kg)     Height 05/04/16 1118 5\' 3"  (1.6 m)     Head Cir --      Peak Flow --      Pain Score --      Pain Loc --      Pain Edu? --      Excl. in [place]? --     Constitutional: Alert and oriented. Well appearing and in no acute distress. Head: Atraumatic.Right TM canal slightly erythematous with no fluid drainage noted. Nose: No congestion/rhinnorhea. Mouth/Throat: Mucous membranes are moist.  Oropharynx non-erythematous. Neck: No stridor. Supple full range of motion no adenopathy.  Cardiovascular: Normal rate, regular rhythm. Grossly normal heart sounds.  Good peripheral circulation. Respiratory: Normal respiratory effort.  No retractions. Lungs CTAB. Musculoskeletal: No lower extremity tenderness nor edema.  No joint effusions. Neurologic:  Normal speech and language. No gross focal neurologic deficits are appreciated. No gait instability. Skin:  Skin is warm, dry and intact. No rash noted. Psychiatric: Mood and affect are normal. Speech and behavior are normal.  ____________________________________________   LABS (all labs ordered are listed, but only abnormal results are displayed)  Labs Reviewed - No data to display ____________________________________________  EKG   ____________________________________________  RADIOLOGY   ____________________________________________   PROCEDURES  Procedure(s) performed: None  Critical Care performed: No  ____________________________________________   INITIAL IMPRESSION / ASSESSMENT AND PLAN / ED COURSE  Pertinent labs & imaging results that were available during my care of the patient were reviewed by me and considered in my medical decision making (see chart for details).  Right otitis media. Rx given for  Bactrim DS twice a day secondary to penicillin allergy. Cortisporin Otic drops for inflammation given to the right ear. Patient follow-up PCP or return here with any worsening symptomology. ____________________________________________   FINAL CLINICAL IMPRESSION(S) / ED DIAGNOSES  Final diagnoses:  Otalgia of right ear     This chart was dictated using voice recognition software/Dragon. Despite best efforts to proofread, errors can occur which can change the meaning. Any change was purely unintentional.   Arlyss Repress, PA-C 05/04/16 1230  Drenda Freeze, MD 05/04/16 1534

## 2017-09-07 ENCOUNTER — Ambulatory Visit: Payer: 59 | Admitting: Obstetrics and Gynecology

## 2017-09-07 ENCOUNTER — Encounter: Payer: Self-pay | Admitting: Obstetrics and Gynecology

## 2017-09-07 VITALS — BP 133/89 | HR 105 | Ht 63.0 in | Wt 268.0 lb

## 2017-09-07 DIAGNOSIS — R102 Pelvic and perineal pain: Secondary | ICD-10-CM

## 2017-09-07 DIAGNOSIS — R4586 Emotional lability: Secondary | ICD-10-CM | POA: Diagnosis not present

## 2017-09-07 DIAGNOSIS — Z9889 Other specified postprocedural states: Secondary | ICD-10-CM

## 2017-09-07 DIAGNOSIS — R350 Frequency of micturition: Secondary | ICD-10-CM | POA: Diagnosis not present

## 2017-09-07 DIAGNOSIS — N951 Menopausal and female climacteric states: Secondary | ICD-10-CM | POA: Diagnosis not present

## 2017-09-07 LAB — POCT URINALYSIS DIPSTICK
BILIRUBIN UA: NEGATIVE
Glucose, UA: NEGATIVE
KETONES UA: NEGATIVE
Leukocytes, UA: NEGATIVE
Nitrite, UA: NEGATIVE
PH UA: 6.5 (ref 5.0–8.0)
PROTEIN UA: NEGATIVE
RBC UA: NEGATIVE
SPEC GRAV UA: 1.015 (ref 1.010–1.025)
Urobilinogen, UA: 0.2 E.U./dL

## 2017-09-07 NOTE — Progress Notes (Signed)
GYNECOLOGY PROGRESS NOTE  Subjective:    Patient ID: Valerie Morales, female    DOB: [birth year], 41 y.o.   MRN: [id number]  HPI  Patient is a 41 y.o. [phone] female who presents for complaints of pelvic pain (mild), usually takes Ibuprofen or Tylenol to relieve.  This has been ongoing for several months.  Notes a history of episodes approximately 3 years ago, which spontaneously resolved.  She is also noting that she feels like she is having PMS symptoms (especially moodiness) almost every other week, hot flushes, and breast tenderness.  Also reports that she is still having light cycles each month, despite h/o endometrial ablation in 2010.  Notes that her periods are intermittent in flow, bleeds like every other day for approximately 5 days.   In addition patient notes that she is having urinary frequency and associated occasional odor.     Past Medical History:  Diagnosis Date  . Anemia    has required 2 units PRBC  . Depression   . History of blood transfusion 2009   [place]  . MRSA (methicillin resistant Staphylococcus aureus)   . Sinusitis     Family History  Problem Relation Age of Onset  . Cancer Mother 46       lung cancer  . Heart disease Mother        MI with stents  . Hypertension Mother   . Diabetes Mother   . Hypertension Father   . Cancer Maternal Aunt        breast cancer  . Cancer Maternal Uncle        colon cancer  . Alcohol abuse Maternal Aunt        Liver Failure  . Alcohol abuse Maternal Grandfather   . Arthritis Paternal Grandmother   . Cancer Paternal Grandmother        breast cancer  . Hypertension Paternal Grandmother     Past Surgical History:  Procedure Laterality Date  . CESAREAN SECTION    . CHOLECYSTECTOMY  1998  . GASTRIC BYPASS    . NASAL SINUS SURGERY    . uterine ablation  2010    Social History   Socioeconomic History  . Marital status: Married    Spouse name: Not on file  . Number of children: 3  . Years of  education: 78  . Highest education level: Not on file  Social Needs  . Financial resource strain: Not on file  . Food insecurity - worry: Not on file  . Food insecurity - inability: Not on file  . Transportation needs - medical: Not on file  . Transportation needs - non-medical: Not on file  Occupational History  . Occupation: Social research officer, government    Comment: [employer]  Tobacco Use  . Smoking status: Never Smoker  . Smokeless tobacco: Never Used  Substance and Sexual Activity  . Alcohol use: Yes    Comment: 1 drink weekly  . Drug use: No  . Sexual activity: Yes    Birth control/protection: None  Other Topics Concern  . Not on file  Social History Narrative   Valerie "[name]" grew up in [place], [place]. She attended [employer] and obtained her Bachelors in [employer]. She lives in [place] with her 3 children and her fiancee.       Caffeine - 2 coffee, no sodas   Exercise - not currently           Current Outpatient Medications on File Prior to Visit  Medication Sig Dispense Refill  . cetirizine (ZYRTEC) 10 MG tablet Take 10 mg by mouth daily.    . diphenhydrAMINE (BENADRYL) 25 MG tablet Take 25 mg by mouth daily as needed.    . cyclobenzaprine (FLEXERIL) 5 MG tablet Take 1 tablet (5 mg total) by mouth 3 (three) times daily as needed for muscle spasms. (Patient not taking: Reported on 09/07/2017) 90 tablet 1  . Multiple Vitamins-Minerals (HAIR/SKIN/NAILS PO) Take 1 tablet by mouth daily.    . predniSONE (STERAPRED UNI-PAK) 10 MG tablet 6 tablets on Day 1 , then reduce by 1 tablet daily until gone (Patient not taking: Reported on 09/07/2017) 21 tablet 0  . sulfamethoxazole-trimethoprim (BACTRIM DS,SEPTRA DS) 800-160 MG tablet Take 1 tablet by mouth 2 (two) times daily. (Patient not taking: Reported on 09/07/2017) 20 tablet 0  . traMADol (ULTRAM) 50 MG tablet Take 1 tablet (50 mg total) by mouth every 8 (eight) hours as needed (Do not drive or operate machinery while  taking as can cause drowsiness.). (Patient not taking: Reported on 09/07/2017) 12 tablet 0  . traZODone (DESYREL) 50 MG tablet Take 1/2 tablet to 1 tablet at bedtime for sleep (Patient not taking: Reported on 09/07/2017) 30 tablet 6   No current facility-administered medications on file prior to visit.     Allergies  Allergen Reactions  . Aspirin Itching  . Penicillins Itching  . Percocet [Oxycodone-Acetaminophen] Hives  . Latex Rash    Review of Systems Pertinent items noted in HPI and remainder of comprehensive ROS otherwise negative.   Objective:   Height 5\' 3"  (1.6 m), weight 268 lb (121.6 kg). General appearance: alert and no distress Abdomen: soft, non-tender; bowel sounds normal; no masses,  no organomegaly Pelvic: external genitalia normal, rectovaginal septum normal.  Vagina without discharge.  Cervix normal appearing, no lesions and no motion tenderness.  Uterus mobile, nontender, normal shape and size.  Adnexae non-palpable, nontender bilaterally.  Extremities: extremities normal, atraumatic, no cyanosis or edema Neurologic: Grossly normal   Labs Results for orders placed or performed in visit on 09/07/17  POCT urinalysis dipstick  Result Value Ref Range   Color, UA yellow    Clarity, UA clear    Glucose, UA neg    Bilirubin, UA neg    Ketones, UA neg    Spec Grav, UA 1.015 1.010 - 1.025   Blood, UA neg    pH, UA 6.5 5.0 - 8.0   Protein, UA neg    Urobilinogen, UA 0.2 0.2 or 1.0 E.U./dL   Nitrite, UA neg    Leukocytes, UA Negative Negative     Assessment:   PMS symptoms (possibly perimenopausal) Urinary frequency Pelvic pain H/o uterine ablation  Plan:   - Discussed likely perimenopausal status.  Ordered labs to assess status.  Discussed management options, including HRT/contraception, non-hormonal prescription meds (anti-depressants), or herbal remedies. Patient desires to think over options.  - Pelvic pain - may be secondary to h/o endometrial  ablation, ovarian cyst. Ruled out today for UTI.  Will order pelvic ultrasound.  Will contact patient with results.   Rubie Maid, MD [facility]

## 2017-09-08 LAB — PROGESTERONE: PROGESTERONE: 9.4 ng/mL

## 2017-09-08 LAB — ESTRADIOL: Estradiol: 117.8 pg/mL

## 2017-09-08 LAB — FSH/LH
FSH: 3.5 m[IU]/mL
LH: 5.1 m[IU]/mL

## 2017-09-08 LAB — TSH: TSH: 5.82 u[IU]/mL — ABNORMAL HIGH (ref 0.450–4.500)

## 2017-09-14 ENCOUNTER — Telehealth: Payer: Self-pay | Admitting: Obstetrics and Gynecology

## 2017-09-14 DIAGNOSIS — N951 Menopausal and female climacteric states: Secondary | ICD-10-CM

## 2017-09-14 NOTE — Telephone Encounter (Signed)
Spoke with pt and made aware of her results per Dr. Marcelline Mates. Pt agreed to the repeat labs, pt has an ultrasound next week and is aware that she can get her blood drawn on that same day. Dr. Marcelline Mates states that this is fine and she does not need to come in sooner. Lab order placed

## 2017-09-21 ENCOUNTER — Other Ambulatory Visit: Payer: Self-pay | Admitting: Obstetrics and Gynecology

## 2017-09-21 ENCOUNTER — Ambulatory Visit (INDEPENDENT_AMBULATORY_CARE_PROVIDER_SITE_OTHER): Payer: 59

## 2017-09-21 DIAGNOSIS — N951 Menopausal and female climacteric states: Secondary | ICD-10-CM | POA: Diagnosis not present

## 2017-09-21 DIAGNOSIS — R102 Pelvic and perineal pain: Secondary | ICD-10-CM

## 2017-09-22 LAB — THYROID PANEL WITH TSH
FREE THYROXINE INDEX: 1.2 (ref 1.2–4.9)
T3 Uptake Ratio: 21 % — ABNORMAL LOW (ref 24–39)
T4, Total: 5.5 ug/dL (ref 4.5–12.0)
TSH: 4.19 u[IU]/mL (ref 0.450–4.500)

## 2017-11-28 ENCOUNTER — Encounter: Payer: Self-pay | Admitting: Emergency Medicine

## 2017-11-28 ENCOUNTER — Emergency Department
Admission: EM | Admit: 2017-11-28 | Discharge: 2017-11-28 | Disposition: A | Discharge status: Home / Self Care | Payer: 59 | Attending: Emergency Medicine | Admitting: Emergency Medicine

## 2017-11-28 ENCOUNTER — Ambulatory Visit: Payer: Self-pay | Admitting: *Deleted

## 2017-11-28 DIAGNOSIS — R030 Elevated blood-pressure reading, without diagnosis of hypertension: Secondary | ICD-10-CM | POA: Insufficient documentation

## 2017-11-28 DIAGNOSIS — Z79899 Other long term (current) drug therapy: Secondary | ICD-10-CM | POA: Diagnosis not present

## 2017-11-28 MED ORDER — HYDROCHLOROTHIAZIDE 25 MG PO TABS: 25.0000 mg | ORAL_TABLET | Freq: Every day | ORAL | 0 refills | Status: DC

## 2017-11-28 NOTE — Discharge Instructions (Signed)
Keep your appointment with your doctor at [facility] health care. Begin taking hydrochlorothiazide 25 mg 1 daily. This medication with the when you go for your doctor's appointment.

## 2017-11-28 NOTE — ED Provider Notes (Signed)
[facility] Emergency Department Provider Note  ____________________________________________   First MD Initiated Contact with Patient 11/28/17 1355     (approximate)  I have reviewed the triage vital signs and the nursing notes.   HISTORY  Chief Complaint Hypertension   HPI Valerie Morales is a 42 y.o. female was told to come to the emergency department by urgent care that she saw earlier today for leg pain.  Patient had elevated blood pressure there.  She states that she was given a Toradol shot at the urgent care.  She denies any other symptoms such as chest pain, shortness of breath, dizziness or headache.  She has an appointment for later this month with [facility].  She is not been diagnosed with hypertension but does have a positive family history.   Past Medical History:  Diagnosis Date  . Anemia    has required 2 units PRBC  . Depression   . History of blood transfusion 2009     . MRSA (methicillin resistant Staphylococcus aureus)   . Sinusitis     Patient Active Problem List   Diagnosis Date Noted  . Routine general medical examination at a health care facility 01/02/2014  . Vaginal discharge 01/02/2014  . Fatigue 12/21/2013  . Hot flashes 12/21/2013  . Sleep disturbance 12/21/2013    Past Surgical History:  Procedure Laterality Date  . CESAREAN SECTION    . CHOLECYSTECTOMY  1998  . GASTRIC BYPASS    . NASAL SINUS SURGERY    . uterine ablation  2010    Prior to Admission medications   Medication Sig Start Date End Date Taking? Authorizing Provider  cetirizine (ZYRTEC) 10 MG tablet Take 10 mg by mouth daily.    [provider]  diphenhydrAMINE (BENADRYL) 25 MG tablet Take 25 mg by mouth daily as needed.    [provider]  hydrochlorothiazide (HYDRODIURIL) 25 MG tablet Take 1 tablet (25 mg total) by mouth daily. 11/28/17   Johnn Hai, PA-C  Multiple Vitamins-Minerals (HAIR/SKIN/NAILS PO)  Take 1 tablet by mouth daily.    [provider]    Allergies Aspirin; Penicillins; Percocet [oxycodone-acetaminophen]; and Latex  Family History  Problem Relation Age of Onset  . Cancer Mother 57       lung cancer  . Heart disease Mother        MI with stents  . Hypertension Mother   . Diabetes Mother   . Hypertension Father   . Cancer Maternal Aunt        breast cancer  . Cancer Maternal Uncle        colon cancer  . Alcohol abuse Maternal Aunt        Liver Failure  . Alcohol abuse Maternal Grandfather   . Arthritis Paternal Grandmother   . Cancer Paternal Grandmother        breast cancer  . Hypertension Paternal Grandmother     Social History Social History   Tobacco Use  . Smoking status: Never Smoker  . Smokeless tobacco: Never Used  Substance Use Topics  . Alcohol use: Yes    Comment: 1 drink weekly  . Drug use: No    Review of Systems Constitutional: No fever/chills Eyes: No visual changes. ENT: No sore throat. Cardiovascular: Denies chest pain. Respiratory: Denies shortness of breath. Gastrointestinal:   No nausea, no vomiting. Musculoskeletal: Negative for back pain. Skin: Negative for rash. Neurological: Negative for headaches, focal weakness or numbness. ___________________________________________   PHYSICAL  EXAM:  VITAL SIGNS: ED Triage Vitals [11/28/17 1327]  Enc Vitals Group     BP (!) 167/98     Pulse Rate 92     Resp 20     Temp 98.1 F (36.7 C)     Temp Source Oral     SpO2 99 %     Weight 263 lb (119.3 kg)     Height 5\' 3"  (1.6 m)     Head Circumference      Peak Flow      Pain Score 0     Pain Loc      Pain Edu?      Excl. in [place]?    Constitutional: Alert and oriented. Well appearing and in no acute distress. Eyes: Conjunctivae are normal.  Head: Atraumatic. Nose: No congestion/rhinnorhea. Neck: No stridor.   Cardiovascular: Normal rate, regular rhythm. Grossly normal heart sounds.  Good peripheral  circulation. Respiratory: Normal respiratory effort.  No retractions. Lungs CTAB. Gastrointestinal: Soft and nontender. No distention.  Neurologic:  Normal speech and language. No gross focal neurologic deficits are appreciated.  Skin:  Skin is warm, dry and intact. No rash noted. Psychiatric: Mood and affect are normal. Speech and behavior are normal.  ____________________________________________   LABS (all labs ordered are listed, but only abnormal results are displayed)  Labs Reviewed - No data to display  PROCEDURES  Procedure(s) performed: None  Procedures  Critical Care performed: No  ____________________________________________   INITIAL IMPRESSION / ASSESSMENT AND PLAN / ED COURSE Patient's blood pressure was checked twice while in the ED.  Both times the diastolic remained elevated.  Patient was given a prescription for hydrochlorothiazide 25 mg to be taken once daily.  She will continue taking this until she sees her PCP on her scheduled appointment.  Patient remained asymptomatic during her ED stay.  ____________________________________________   FINAL CLINICAL IMPRESSION(S) / ED DIAGNOSES  Final diagnoses:  Elevated blood pressure reading     ED Discharge Orders        Ordered    hydrochlorothiazide (HYDRODIURIL) 25 MG tablet  Daily     11/28/17 1430       Note:  This document was prepared using Dragon voice recognition software and may include unintentional dictation errors.    Johnn Hai, PA-C 11/28/17 1514    Lavonia Drafts, MD 11/28/17 1520

## 2017-11-28 NOTE — Telephone Encounter (Signed)
Called in from the [facility] on [address] because they can't treat her high BP due to it being "stroke level" high.    She does not have a PCP physician but has an appt on 12/15/17 to establish care at [facility] with Dr. Terese Door.    After triaging her symptoms I referred her to the ED.  She had a headache and c/o burning, tingling and numbness in both of her legs right now.   Her BP is 188/98 just now at the [facility].   Her husband is with her. I instructed her to go to the closest ED to be evaluated.   Her husband is going to drive her from the [place] to [facility] now. She has no history of hypertension. I did not route a note to a provider since she is not established yet.    They verbalized understanding of the situation and agreed to go straight to [facility] ED. Reason for Disposition . [1] Numbness (i.e., loss of sensation) of the face, arm or leg on one side of the body AND [2] new onset  Answer Assessment - Initial Assessment Questions 1. BLOOD PRESSURE: "What is the blood pressure?" "Did you take at least two measurements 5 minutes apart?"     188/98 taken at the urgent care center just now.   She is at [facility] on [address]: "When did you take your blood pressure?"     Just a few minutes ago at the urgent care.   They said they can't treat me because they can't treat high BP here because it's stroke level. 3. HOW: "How did you obtain the blood pressure?" (e.g., visiting nurse, automatic home BP monitor)     Taken at urgent care 4. HISTORY: "Do you have a history of high blood pressure?"     No history 5. MEDICATIONS: "Are you taking any medications for blood pressure?" "Have you missed any doses recently?"     No medications 6. OTHER SYMPTOMS: "Do you have any symptoms?" (e.g., headache, chest pain, blurred vision, difficulty breathing, weakness)     Both legs are burning, numb and tingling.   They gave me a shot of  steroids  to help with the inflammation just now. 7. PREGNANCY: "Is there any chance you are pregnant?" "When was your last menstrual period?"     No.  Protocols used: HIGH BLOOD PRESSURE-A-AH

## 2017-11-28 NOTE — ED Triage Notes (Signed)
Pt reports was at [facility] this am for her legs and was told to come to the ED for high blood pressure. Pt unable to follow up with PMD because she needs to get reestablished with her PMD. Pt reports has never been on meds for HTN. Pt denies CP, SOB, all other sx's except her legs. Pt reports did have a HA Thursday but it stopped Friday.

## 2017-12-15 ENCOUNTER — Encounter: Payer: Self-pay | Admitting: Internal Medicine

## 2017-12-15 ENCOUNTER — Ambulatory Visit: Payer: 59 | Admitting: Internal Medicine

## 2017-12-15 VITALS — BP 158/100 | HR 74 | Temp 98.0°F | Ht 67.0 in | Wt 265.0 lb

## 2017-12-15 DIAGNOSIS — D649 Anemia, unspecified: Secondary | ICD-10-CM

## 2017-12-15 DIAGNOSIS — N644 Mastodynia: Secondary | ICD-10-CM | POA: Diagnosis not present

## 2017-12-15 DIAGNOSIS — G8929 Other chronic pain: Secondary | ICD-10-CM | POA: Insufficient documentation

## 2017-12-15 DIAGNOSIS — M25572 Pain in left ankle and joints of left foot: Secondary | ICD-10-CM

## 2017-12-15 DIAGNOSIS — F329 Major depressive disorder, single episode, unspecified: Secondary | ICD-10-CM | POA: Diagnosis not present

## 2017-12-15 DIAGNOSIS — I10 Essential (primary) hypertension: Secondary | ICD-10-CM

## 2017-12-15 DIAGNOSIS — Z9884 Bariatric surgery status: Secondary | ICD-10-CM | POA: Diagnosis not present

## 2017-12-15 DIAGNOSIS — R51 Headache: Secondary | ICD-10-CM | POA: Diagnosis not present

## 2017-12-15 DIAGNOSIS — F419 Anxiety disorder, unspecified: Secondary | ICD-10-CM | POA: Diagnosis not present

## 2017-12-15 DIAGNOSIS — R102 Pelvic and perineal pain: Secondary | ICD-10-CM

## 2017-12-15 DIAGNOSIS — Z6841 Body Mass Index (BMI) 40.0 and over, adult: Secondary | ICD-10-CM

## 2017-12-15 DIAGNOSIS — E669 Obesity, unspecified: Secondary | ICD-10-CM | POA: Insufficient documentation

## 2017-12-15 DIAGNOSIS — R519 Headache, unspecified: Secondary | ICD-10-CM | POA: Insufficient documentation

## 2017-12-15 MED ORDER — HYDROCHLOROTHIAZIDE 25 MG PO TABS: 25.0000 mg | ORAL_TABLET | Freq: Every day | ORAL | 1 refills | Status: DC

## 2017-12-15 MED ORDER — VENLAFAXINE HCL ER 37.5 MG PO CP24: 37.5000 mg | ORAL_CAPSULE | Freq: Every day | ORAL | 2 refills | Status: DC

## 2017-12-15 MED ORDER — AMLODIPINE BESYLATE 5 MG PO TABS: 5.0000 mg | ORAL_TABLET | Freq: Every day | ORAL | 1 refills | Status: DC

## 2017-12-15 NOTE — Patient Instructions (Signed)
Please f/u in 3 weeks  Labs at [facility] before next visit   Venlafaxine tablets What is this medicine? VENLAFAXINE (VEN la fax een) is used to treat depression, anxiety and panic disorder. This medicine may be used for other purposes; ask your health care provider or pharmacist if you have questions. COMMON BRAND NAME(S): Effexor What should I tell my health care provider before I take this medicine? They need to know if you have any of these conditions: -bleeding disorders -glaucoma -heart disease -high blood pressure -high cholesterol -kidney disease -liver disease -low levels of sodium in the blood -mania or bipolar disorder -seizures -suicidal thoughts, plans, or attempt; a previous suicide attempt by you or a family -take medicines that treat or prevent blood clots -thyroid disease -an unusual or allergic reaction to venlafaxine, desvenlafaxine, other medicines, foods, dyes, or preservatives -pregnant or trying to get pregnant -breast-feeding How should I use this medicine? Take this medicine by mouth with a glass of water. Follow the directions on the prescription label. Take it with food. Take your medicine at regular intervals. Do not take your medicine more often than directed. Do not stop taking this medicine suddenly except upon the advice of your doctor. Stopping this medicine too quickly may cause serious side effects or your condition may worsen. A special MedGuide will be given to you by the pharmacist with each prescription and refill. Be sure to read this information carefully each time. Talk to your pediatrician regarding the use of this medicine in children. Special care may be needed. Overdosage: If you think you have taken too much of this medicine contact a poison control center or emergency room at once. NOTE: This medicine is only for you. Do not share this medicine with others. What if I miss a dose? If you miss a dose, take it as soon as you can. If it is  almost time for your next dose, take only that dose. Do not take double or extra doses. What may interact with this medicine? Do not take this medicine with any of the following medications: -certain medicines for fungal infections like fluconazole, itraconazole, ketoconazole, posaconazole, voriconazole -cisapride -desvenlafaxine -dofetilide -dronedarone -duloxetine -levomilnacipran -linezolid -MAOIs like Carbex, Eldepryl, Marplan, Nardil, and Parnate -methylene blue (injected into a vein) -milnacipran -pimozide -thioridazine -ziprasidone This medicine may also interact with the following medications: -amphetamines -aspirin and aspirin-like medicines -certain medicines for depression, anxiety, or psychotic disturbances -certain medicines for migraine headaches like almotriptan, eletriptan, frovatriptan, naratriptan, rizatriptan, sumatriptan, zolmitriptan -certain medicines for sleep -certain medicines that treat or prevent blood clots like dalteparin, enoxaparin, warfarin -cimetidine -clozapine -diuretics -fentanyl -furazolidone -indinavir -isoniazid -lithium -metoprolol -NSAIDS, medicines for pain and inflammation, like ibuprofen or naproxen -other medicines that prolong the QT interval (cause an abnormal heart rhythm) -procarbazine -rasagiline -supplements like St. John's wort, kava kava, valerian -tramadol -tryptophan This list may not describe all possible interactions. Give your health care provider a list of all the medicines, herbs, non-prescription drugs, or dietary supplements you use. Also tell them if you smoke, drink alcohol, or use illegal drugs. Some items may interact with your medicine. What should I watch for while using this medicine? Tell your doctor if your symptoms do not get better or if they get worse. Visit your doctor or health care professional for regular checks on your progress. Because it may take several weeks to see the full effects of this  medicine, it is important to continue your treatment as prescribed by your doctor. Patients and their  families should watch out for new or worsening thoughts of suicide or depression. Also watch out for sudden changes in feelings such as feeling anxious, agitated, panicky, irritable, hostile, aggressive, impulsive, severely restless, overly excited and hyperactive, or not being able to sleep. If this happens, especially at the beginning of treatment or after a change in dose, call your health care professional. This medicine can cause an increase in blood pressure. Check with your doctor for instructions on monitoring your blood pressure while taking this medicine. You may get drowsy or dizzy. Do not drive, use machinery, or do anything that needs mental alertness until you know how this medicine affects you. Do not stand or sit up quickly, especially if you are an older patient. This reduces the risk of dizzy or fainting spells. Alcohol may interfere with the effect of this medicine. Avoid alcoholic drinks. Your mouth may get dry. Chewing sugarless gum, sucking hard candy and drinking plenty of water will help. Contact your doctor if the problem does not go away or is severe. What side effects may I notice from receiving this medicine? Side effects that you should report to your doctor or health care professional as soon as possible: -allergic reactions like skin rash, itching or hives, swelling of the face, lips, or tongue -anxious -breathing problems -confusion -changes in vision -chest pain -confusion -elevated mood, decreased need for sleep, racing thoughts, impulsive behavior -eye pain -fast, irregular heartbeat -feeling faint or lightheaded, falls -feeling agitated, angry, or irritable -hallucination, loss of contact with reality -high blood pressure -loss of balance or coordination -palpitations -redness, blistering, peeling or loosening of the skin, including inside the  mouth -restlessness, pacing, inability to keep still -seizures -stiff muscles -suicidal thoughts or other mood changes -trouble passing urine or change in the amount of urine -trouble sleeping -unusual bleeding or bruising -unusually weak or tired -vomiting Side effects that usually do not require medical attention (report to your doctor or health care professional if they continue or are bothersome): -change in sex drive or performance -change in appetite or weight -constipation -dizziness -dry mouth -headache -increased sweating -nausea -tired This list may not describe all possible side effects. Call your doctor for medical advice about side effects. You may report side effects to FDA at 1-800-FDA-1088. Where should I keep my medicine? Keep out of the reach of children. Store at a controlled temperature between 20 and 25 degrees C (68 and 77 degrees F), in a dry place. Throw away any unused medicine after the expiration date. NOTE: This sheet is a summary. It may not cover all possible information. If you have questions about this medicine, talk to your doctor, pharmacist, or health care provider.  2018 Elsevier/Gold Standard (2016-03-04 18:42:26)  Generalized Anxiety Disorder, Adult Generalized anxiety disorder (GAD) is a mental health disorder. People with this condition constantly worry about everyday events. Unlike normal anxiety, worry related to GAD is not triggered by a specific event. These worries also do not fade or get better with time. GAD interferes with life functions, including relationships, work, and school. GAD can vary from mild to severe. People with severe GAD can have intense waves of anxiety with physical symptoms (panic attacks). What are the causes? The exact cause of GAD is not known. What increases the risk? This condition is more likely to develop in:  Women.  People who have a family history of anxiety disorders.  People who are very  shy.  People who experience very stressful life events, such as  the death of a loved one.  People who have a very stressful family environment.  What are the signs or symptoms? People with GAD often worry excessively about many things in their lives, such as their health and family. They may also be overly concerned about:  Doing well at work.  Being on time.  Natural disasters.  Friendships.  Physical symptoms of GAD include:  Fatigue.  Muscle tension or having muscle twitches.  Trembling or feeling shaky.  Being easily startled.  Feeling like your heart is pounding or racing.  Feeling out of breath or like you cannot take a deep breath.  Having trouble falling asleep or staying asleep.  Sweating.  Nausea, diarrhea, or irritable bowel syndrome (IBS).  Headaches.  Trouble concentrating or remembering facts.  Restlessness.  Irritability.  How is this diagnosed? Your health care provider can diagnose GAD based on your symptoms and medical history. You will also have a physical exam. The health care provider will ask specific questions about your symptoms, including how severe they are, when they started, and if they come and go. Your health care provider may ask you about your use of alcohol or drugs, including prescription medicines. Your health care provider may refer you to a mental health specialist for further evaluation. Your health care provider will do a thorough examination and may perform additional tests to rule out other possible causes of your symptoms. To be diagnosed with GAD, a person must have anxiety that:  Is out of his or her control.  Affects several different aspects of his or her life, such as work and relationships.  Causes distress that makes him or her unable to take part in normal activities.  Includes at least three physical symptoms of GAD, such as restlessness, fatigue, trouble concentrating, irritability, muscle tension, or sleep  problems.  Before your health care provider can confirm a diagnosis of GAD, these symptoms must be present more days than they are not, and they must last for six months or longer. How is this treated? The following therapies are usually used to treat GAD:  Medicine. Antidepressant medicine is usually prescribed for long-term daily control. Antianxiety medicines may be added in severe cases, especially when panic attacks occur.  Talk therapy (psychotherapy). Certain types of talk therapy can be helpful in treating GAD by providing support, education, and guidance. Options include: ? Cognitive behavioral therapy (CBT). People learn coping skills and techniques to ease their anxiety. They learn to identify unrealistic or negative thoughts and behaviors and to replace them with positive ones. ? Acceptance and commitment therapy (ACT). This treatment teaches people how to be mindful as a way to cope with unwanted thoughts and feelings. ? Biofeedback. This process trains you to manage your body's response (physiological response) through breathing techniques and relaxation methods. You will work with a therapist while machines are used to monitor your physical symptoms.  Stress management techniques. These include yoga, meditation, and exercise.  A mental health specialist can help determine which treatment is best for you. Some people see improvement with one type of therapy. However, other people require a combination of therapies. Follow these instructions at home:  Take over-the-counter and prescription medicines only as told by your health care provider.  Try to maintain a normal routine.  Try to anticipate stressful situations and allow extra time to manage them.  Practice any stress management or self-calming techniques as taught by your health care provider.  Do not punish yourself for setbacks or for  not making progress.  Try to recognize your accomplishments, even if they are  small.  Keep all follow-up visits as told by your health care provider. This is important. Contact a health care provider if:  Your symptoms do not get better.  Your symptoms get worse.  You have signs of depression, such as: ? A persistently sad, cranky, or irritable mood. ? Loss of enjoyment in activities that used to bring you joy. ? Change in weight or eating. ? Changes in sleeping habits. ? Avoiding friends or family members. ? Loss of energy for normal tasks. ? Feelings of guilt or worthlessness. Get help right away if:  You have serious thoughts about hurting yourself or others. If you ever feel like you may hurt yourself or others, or have thoughts about taking your own life, get help right away. You can go to your nearest emergency department or call:  Your local emergency services (911 in the [place]  A suicide crisis helpline, such as the [facility] at 205-334-6927. This is open 24 hours a day.  Summary  Generalized anxiety disorder (GAD) is a mental health disorder that involves worry that is not triggered by a specific event.  People with GAD often worry excessively about many things in their lives, such as their health and family.  GAD may cause physical symptoms such as restlessness, trouble concentrating, sleep problems, frequent sweating, nausea, diarrhea, headaches, and trembling or muscle twitching.  A mental health specialist can help determine which treatment is best for you. Some people see improvement with one type of therapy. However, other people require a combination of therapies. This information is not intended to replace advice given to you by your health care provider. Make sure you discuss any questions you have with your health care provider. Document Released: 01/29/2013 Document Revised: 08/24/2016 Document Reviewed: 08/24/2016 Elsevier Interactive Patient Education  2018 [employer].  Major Depressive Disorder,  Adult Major depressive disorder (MDD) is a mental health condition. MDD often makes you feel sad, hopeless, or helpless. MDD can also cause symptoms in your body. MDD can affect your:  Work.  School.  Relationships.  Other normal activities.  MDD can range from mild to very bad. It may occur once (single episode MDD). It can also occur many times (recurrent MDD). The main symptoms of MDD often include:  Feeling sad, depressed, or irritable most of the time.  Loss of interest.  MDD symptoms also include:  Sleeping too much or too little.  Eating too much or too little.  A change in your weight.  Feeling tired (fatigue) or having low energy.  Feeling worthless.  Feeling guilty.  Trouble making decisions.  Trouble thinking clearly.  Thoughts of suicide or harming others.  Feeling weak.  Feeling agitated.  Keeping yourself from being around other people (isolation).  Follow these instructions at home: Activity  Do these things as told by your doctor: ? Go back to your normal activities. ? Exercise regularly. ? Spend time outdoors. Alcohol  Talk with your doctor about how alcohol can affect your antidepressant medicines.  Do not drink alcohol. Or, limit how much alcohol you drink. ? This means no more than 1 drink a day for nonpregnant women and 2 drinks a day for men. One drink equals one of these:  12 oz of beer.  5 oz of wine.  1 oz of hard liquor. General instructions  Take over-the-counter and prescription medicines only as told by your doctor.  Eat  a healthy diet.  Get plenty of sleep.  Find activities that you enjoy. Make time to do them.  Think about joining a support group. Your doctor may be able to suggest a group for you.  Keep all follow-up visits as told by your doctor. This is important. Where to find more information:  [employer] on Mental Illness: ? www.nami.[place]: ? https://carter.com/  National Suicide Prevention Lifeline: ? 9387149627. This is free, 24-hour help. Contact a doctor if:  Your symptoms get worse.  You have new symptoms. Get help right away if:  You self-harm.  You see, hear, taste, smell, or feel things that are not present (hallucinate). If you ever feel like you may hurt yourself or others, or have thoughts about taking your own life, get help right away. You can go to your nearest emergency department or call:  Your local emergency services (911 in the [place]  A suicide crisis helpline, such as the National Suicide Prevention Lifeline: ? 712-777-2603. This is open 24 hours a day.  This information is not intended to replace advice given to you by your health care provider. Make sure you discuss any questions you have with your health care provider. Document Released: 09/15/2015 Document Revised: 06/20/2016 Document Reviewed: 06/20/2016 Elsevier Interactive Patient Education  2017 [employer].

## 2017-12-15 NOTE — Progress Notes (Signed)
Pre visit review using our clinic review tool, if applicable. No additional management support is needed unless otherwise documented below in the visit note. 

## 2017-12-16 ENCOUNTER — Other Ambulatory Visit: Payer: Self-pay | Admitting: Internal Medicine

## 2017-12-17 LAB — CBC WITH DIFFERENTIAL/PLATELET
BASOS: 0 %
Basophils Absolute: 0 10*3/uL (ref 0.0–0.2)
EOS (ABSOLUTE): 0.2 10*3/uL (ref 0.0–0.4)
EOS: 3 %
HEMATOCRIT: 38.7 % (ref 34.0–46.6)
Hemoglobin: 13.1 g/dL (ref 11.1–15.9)
Immature Grans (Abs): 0 10*3/uL (ref 0.0–0.1)
Immature Granulocytes: 0 %
LYMPHS ABS: 2.4 10*3/uL (ref 0.7–3.1)
Lymphs: 33 %
MCH: 29.1 pg (ref 26.6–33.0)
MCHC: 33.9 g/dL (ref 31.5–35.7)
MCV: 86 fL (ref 79–97)
Monocytes Absolute: 0.3 10*3/uL (ref 0.1–0.9)
Monocytes: 4 %
NEUTROS ABS: 4.2 10*3/uL (ref 1.4–7.0)
Neutrophils: 60 %
Platelets: 321 10*3/uL (ref 150–379)
RBC: 4.5 x10E6/uL (ref 3.77–5.28)
RDW: 15.6 % — ABNORMAL HIGH (ref 12.3–15.4)
WBC: 7.1 10*3/uL (ref 3.4–10.8)

## 2017-12-17 LAB — TSH: TSH: 2.68 u[IU]/mL (ref 0.450–4.500)

## 2017-12-17 LAB — LIPID PANEL W/O CHOL/HDL RATIO
Cholesterol, Total: 212 mg/dL — ABNORMAL HIGH (ref 100–199)
HDL: 49 mg/dL (ref >39)
LDL CALC: 140 mg/dL — AB (ref 0–99)
Triglycerides: 116 mg/dL (ref 0–149)
VLDL Cholesterol Cal: 23 mg/dL (ref 5–40)

## 2017-12-17 LAB — COMPREHENSIVE METABOLIC PANEL
ALBUMIN: 4.5 g/dL (ref 3.5–5.5)
ALT: 21 IU/L (ref 0–32)
AST: 21 IU/L (ref 0–40)
Albumin/Globulin Ratio: 1.5 (ref 1.2–2.2)
Alkaline Phosphatase: 84 IU/L (ref 39–117)
BILIRUBIN TOTAL: 0.2 mg/dL (ref 0.0–1.2)
BUN / CREAT RATIO: 12 (ref 9–23)
BUN: 8 mg/dL (ref 6–24)
CHLORIDE: 100 mmol/L (ref 96–106)
CO2: 24 mmol/L (ref 20–29)
Calcium: 9.3 mg/dL (ref 8.7–10.2)
Creatinine, Ser: 0.68 mg/dL (ref 0.57–1.00)
GFR calc Af Amer: 125 mL/min/{1.73_m2} (ref >59)
GFR calc non Af Amer: 108 mL/min/{1.73_m2} (ref >59)
GLOBULIN, TOTAL: 3 g/dL (ref 1.5–4.5)
Glucose: 92 mg/dL (ref 65–99)
Potassium: 4.1 mmol/L (ref 3.5–5.2)
SODIUM: 141 mmol/L (ref 134–144)
Total Protein: 7.5 g/dL (ref 6.0–8.5)

## 2017-12-17 LAB — T3, FREE: T3 FREE: 2.8 pg/mL (ref 2.0–4.4)

## 2017-12-17 LAB — FERRITIN: Ferritin: 23 ng/mL (ref 15–150)

## 2017-12-17 LAB — IRON AND TIBC
IRON SATURATION: 17 % (ref 15–55)
IRON: 71 ug/dL (ref 27–159)
Total Iron Binding Capacity: 430 ug/dL (ref 250–450)
UIBC: 359 ug/dL (ref 131–425)

## 2017-12-17 LAB — THYROID PEROXIDASE ANTIBODY: Thyroperoxidase Ab SerPl-aCnc: 34 IU/mL (ref 0–34)

## 2017-12-17 LAB — T4, FREE: Free T4: 0.95 ng/dL (ref 0.82–1.77)

## 2017-12-17 LAB — VITAMIN D 25 HYDROXY (VIT D DEFICIENCY, FRACTURES): Vit D, 25-Hydroxy: 7.9 ng/mL — ABNORMAL LOW (ref 30.0–100.0)

## 2017-12-17 LAB — VITAMIN B12: Vitamin B-12: 610 pg/mL (ref 232–1245)

## 2017-12-18 ENCOUNTER — Encounter: Payer: Self-pay | Admitting: Internal Medicine

## 2017-12-18 DIAGNOSIS — F329 Major depressive disorder, single episode, unspecified: Secondary | ICD-10-CM | POA: Insufficient documentation

## 2017-12-18 DIAGNOSIS — F32A Depression, unspecified: Secondary | ICD-10-CM | POA: Insufficient documentation

## 2017-12-18 DIAGNOSIS — I10 Essential (primary) hypertension: Secondary | ICD-10-CM | POA: Insufficient documentation

## 2017-12-18 DIAGNOSIS — F419 Anxiety disorder, unspecified: Secondary | ICD-10-CM | POA: Insufficient documentation

## 2017-12-18 NOTE — Progress Notes (Signed)
Chief Complaint  Patient presents with  . New Patient (Initial Visit)   New patient  1. C/o depression, anxiety, mood swings and anger PHQ 9 10 today she is tearful on exam. She has been on trazadone in the past w/o help  2. HTN new dx on hct 25 mg qd started in the ED. She did not take this this am She is having h/as with elevated bp  3. S/p gastric bypass surgery in 2003 and reports she has gained the weight back currently does not exercise.  4. C/o lower pelvic pain left side. She does have OB/GYN Dr. Marcelline Mates [facility] last pap 09/07/17  5. C/o pain on left breast  6. C/o left pain in ankle intermittent 2/2 03/2015 old injury avulsion injury lateral and medial malleolus. She tries elevation and has stablizer at times    Review of Systems  Constitutional: Negative for weight loss.  HENT: Negative for hearing loss.   Eyes: Negative for blurred vision.  Respiratory: Negative for shortness of breath.   Cardiovascular: Negative for chest pain.  Gastrointestinal: Positive for abdominal pain.  Musculoskeletal: Positive for joint pain.  Skin: Negative for rash.  Neurological: Positive for headaches.  Psychiatric/Behavioral: Positive for depression. The patient is nervous/anxious.    Past Medical History:  Diagnosis Date  . Anemia    has required 2 units PRBC  . Chicken pox   . Depression    trazadone has not helped in the past   . Headache   . History of blood transfusion 2009     . Hypertension   . MRSA (methicillin resistant Staphylococcus aureus)   . Sinusitis   . UTI (urinary tract infection)   . Vitamin D deficiency    Past Surgical History:  Procedure Laterality Date  . CESAREAN SECTION     x3 [place]  . CHOLECYSTECTOMY  1998  . GASTRIC BYPASS     ? duodenal switch in [facility]   . NASAL SINUS SURGERY    . uterine ablation  2010   Family History  Problem Relation Age of Onset  . Cancer Mother 44       lung cancer-small cell lung cancer    . Heart disease Mother        MI with stents  . Hypertension Mother   . Diabetes Mother   . COPD Mother   . Hypertension Father   . Hyperlipidemia Father   . Cancer Maternal Aunt        breast cancer  . Cancer Maternal Uncle        colon cancer  . Mental illness Son   . Alcohol abuse Maternal Aunt        Liver Failure  . Alcohol abuse Maternal Grandfather   . Heart disease Maternal Grandfather   . Arthritis Paternal Grandmother   . Cancer Paternal Grandmother        leukemia   . Hypertension Paternal Grandmother   . Cancer Maternal Grandmother        brain cancer   . Heart disease Paternal Grandfather   . Hypertension Paternal Grandfather    Social History   Socioeconomic History  . Marital status: Married    Spouse name: Not on file  . Number of children: 3  . Years of education: 61  . Highest education level: Not on file  Social Needs  . Financial resource strain: Not on file  . Food insecurity - worry: Not on file  . Food  insecurity - inability: Not on file  . Transportation needs - medical: Not on file  . Transportation needs - non-medical: Not on file  Occupational History  . Occupation: caseworker  Tobacco Use  . Smoking status: Never Smoker  . Smokeless tobacco: Never Used  Substance and Sexual Activity  . Alcohol use: Yes    Comment: 1 drink weekly  . Drug use: No  . Sexual activity: Yes    Birth control/protection: None  Other Topics Concern  . Not on file  Social History Narrative   Dorea "[name]" grew up in [place], [place]. She attended [employer] and obtained her Bachelors in [employer]. She lives in [place] with her 3 children and her husband       Caffeine - 2 coffee, no sodas   Exercise - not currently   Bachelors degree    Caseworker    4 pregnancies, 3 live births    Feels safe in relationship    Wears seatbelt   No guns       Current Meds  Medication Sig  . cetirizine (ZYRTEC) 10 MG tablet Take 10 mg by mouth daily.  .  diphenhydrAMINE (BENADRYL) 25 MG tablet Take 25 mg by mouth daily as needed.  . hydrochlorothiazide (HYDRODIURIL) 25 MG tablet Take 1 tablet (25 mg total) by mouth daily.  . Multiple Vitamins-Minerals (HAIR/SKIN/NAILS PO) Take 1 tablet by mouth daily.  . [DISCONTINUED] hydrochlorothiazide (HYDRODIURIL) 25 MG tablet Take 1 tablet (25 mg total) by mouth daily.   Allergies  Allergen Reactions  . Aspirin Itching    hives  . Other     ? Nut allergy   . Penicillins Itching    Hives   . Percocet [Oxycodone-Acetaminophen] Hives    Hives   . Latex Rash    Hives    Recent Results (from the past 2160 hour(s))  Thyroid Panel With TSH     Status: Abnormal   Collection Time: 09/21/17  3:54 PM  Result Value Ref Range   TSH 4.190 0.450 - 4.500 uIU/mL   T4, Total 5.5 4.5 - 12.0 ug/dL   T3 Uptake Ratio 21 (L) 24 - 39 %   Free Thyroxine Index 1.2 1.2 - 4.9   Objective  Body mass index is 41.5 kg/m. Wt Readings from Last 3 Encounters:  12/15/17 265 lb (120.2 kg)  11/28/17 263 lb (119.3 kg)  09/07/17 268 lb (121.6 kg)   Temp Readings from Last 3 Encounters:  12/15/17 98 F (36.7 C) (Oral)  11/28/17 98.1 F (36.7 C) (Oral)  05/04/16 98.5 F (36.9 C) (Oral)   BP Readings from Last 3 Encounters:  12/15/17 (!) 158/100  11/28/17 (!) 158/103  09/07/17 133/89   Pulse Readings from Last 3 Encounters:  12/15/17 74  11/28/17 92  09/07/17 (!) 105   O2 sat room air 95%  Physical Exam  Constitutional: She is oriented to person, place, and time and well-developed, well-nourished, and in no distress.  HENT:  Head: Normocephalic and atraumatic.  Mouth/Throat: Oropharynx is clear and moist and mucous membranes are normal.  Eyes: Conjunctivae are normal. Pupils are equal, round, and reactive to light.  Cardiovascular: Normal rate, regular rhythm and normal heart sounds.  Pulmonary/Chest: Effort normal and breath sounds normal.  Neurological: She is alert and oriented to person, place, and  time. Gait normal. Gait normal.  Skin: Skin is warm and dry.  Psychiatric: Memory, affect and judgment normal. She exhibits a depressed mood.  Tearful on exam   Nursing  note and vitals reviewed.   Assessment   1. Depression/anxiety/anger phq9 score 10 today  2. HTN uncontrolled with h/as  3. S/p gastric bypass  4. H/o anemia  5. Left pelvic pain [place] 09/21/17 fibroid 6. C/o breast pain  7. Chronic left ankle pian  8.obesity  9. HM Plan  1. Start effexor 37.5 mg qd f/u in 3 weeks no help with trazadone in the past  2. hct 25 mg qd, add norvasc 5 mg qd  3. Check labs CMET, CBC, UA had 09/07/17, lipid, TSH, T4, T3, anti TPO abx (h/o abnormal thyoid tests), anemia w/u, vit D, vit B12 given [facility] form today   Declines STD, hep B check  4 see #3  5. F/u OB/GYN Dr. Marcelline Mates get notes release signed today  6. Refer for mammogram at f/u no record on file  7. Consider ortho referral in future  8. Disc exercise to lose  9.  Decline flu shot  Consider Tdap will disc at f/u  Declines hep B status check  Declines STD check   Pap had 09/07/17 Dr. Marcelline Mates get record Refer for mammogram at f/u if has not had  Assessed depression today see #1  rec exercise to lose wt   "I spent 35 minutes face-to face with patient with greater than 50% of time spent counseling and/or in coordination of care via chart review, plan of care addressing HTN, anxiety, weight loss.    Provider: Dr. Olivia Mackie [facility]

## 2017-12-19 ENCOUNTER — Encounter: Payer: Self-pay | Admitting: Internal Medicine

## 2017-12-21 ENCOUNTER — Other Ambulatory Visit: Payer: Self-pay | Admitting: Internal Medicine

## 2017-12-21 ENCOUNTER — Encounter: Payer: Self-pay | Admitting: Internal Medicine

## 2017-12-21 DIAGNOSIS — J0191 Acute recurrent sinusitis, unspecified: Secondary | ICD-10-CM

## 2017-12-21 MED ORDER — PREDNISONE 10 MG PO TABS: 10.0000 mg | ORAL_TABLET | Freq: Every day | ORAL | 0 refills | Status: DC

## 2017-12-21 MED ORDER — AZITHROMYCIN 250 MG PO TABS: ORAL_TABLET | ORAL | 0 refills | Status: DC

## 2017-12-23 ENCOUNTER — Other Ambulatory Visit: Payer: Self-pay | Admitting: Internal Medicine

## 2017-12-23 ENCOUNTER — Telehealth: Payer: Self-pay | Admitting: Internal Medicine

## 2017-12-23 DIAGNOSIS — E785 Hyperlipidemia, unspecified: Secondary | ICD-10-CM

## 2017-12-23 DIAGNOSIS — E559 Vitamin D deficiency, unspecified: Secondary | ICD-10-CM

## 2017-12-23 MED ORDER — CHOLECALCIFEROL 1.25 MG (50000 UT) PO CAPS: 50000.0000 [IU] | ORAL_CAPSULE | ORAL | 1 refills | Status: DC

## 2017-12-23 MED ORDER — ATORVASTATIN CALCIUM 20 MG PO TABS: 20.0000 mg | ORAL_TABLET | Freq: Every day | ORAL | 3 refills | Status: DC

## 2017-12-23 NOTE — Telephone Encounter (Signed)
Copied from [place] [id number]. Topic: Quick Communication - See Telephone Encounter >> Dec 23, 2017  3:34 PM Bea Graff, NT wrote: CRM for notification. See Telephone encounter for: Pt states a few days ago she was told that vitamin D and Lipitor would be called into her pharmacy. She uses [facility] on [address] and the pharmacy has not received those medications yet.   12/23/17.

## 2017-12-23 NOTE — Telephone Encounter (Signed)
Please advise 

## 2018-01-09 ENCOUNTER — Other Ambulatory Visit: Payer: Self-pay | Admitting: Internal Medicine

## 2018-01-09 ENCOUNTER — Ambulatory Visit: Payer: 59 | Admitting: Internal Medicine

## 2018-01-09 ENCOUNTER — Encounter: Payer: Self-pay | Admitting: Internal Medicine

## 2018-01-09 VITALS — BP 126/90 | HR 90 | Temp 97.7°F | Ht 67.0 in | Wt 256.2 lb

## 2018-01-09 DIAGNOSIS — F419 Anxiety disorder, unspecified: Secondary | ICD-10-CM

## 2018-01-09 DIAGNOSIS — N644 Mastodynia: Secondary | ICD-10-CM

## 2018-01-09 DIAGNOSIS — M722 Plantar fascial fibromatosis: Secondary | ICD-10-CM | POA: Insufficient documentation

## 2018-01-09 DIAGNOSIS — Z6841 Body Mass Index (BMI) 40.0 and over, adult: Secondary | ICD-10-CM | POA: Diagnosis not present

## 2018-01-09 DIAGNOSIS — R519 Headache, unspecified: Secondary | ICD-10-CM

## 2018-01-09 DIAGNOSIS — I10 Essential (primary) hypertension: Secondary | ICD-10-CM | POA: Diagnosis not present

## 2018-01-09 DIAGNOSIS — Z23 Encounter for immunization: Secondary | ICD-10-CM | POA: Diagnosis not present

## 2018-01-09 DIAGNOSIS — F329 Major depressive disorder, single episode, unspecified: Secondary | ICD-10-CM

## 2018-01-09 DIAGNOSIS — R51 Headache: Secondary | ICD-10-CM

## 2018-01-09 NOTE — Patient Instructions (Signed)
Think about insight timer, headspace and calm meditation free apps for phone  Please schedule [facility] me about if you stop breathing at night and also if you want to change the Effexor  F/u in 2-3 months  Plantar Fasciitis Plantar fasciitis is a painful foot condition that affects the heel. It occurs when the band of tissue that connects the toes to the heel bone (plantar fascia) becomes irritated. This can happen after exercising too much or doing other repetitive activities (overuse injury). The pain from plantar fasciitis can range from mild irritation to severe pain that makes it difficult for you to walk or move. The pain is usually worse in the morning or after you have been sitting or lying down for a while. What are the causes? This condition may be caused by:  Standing for long periods of time.  Wearing shoes that do not fit.  Doing high-impact activities, including running, aerobics, and ballet.  Being overweight.  Having an abnormal way of walking (gait).  Having tight calf muscles.  Having high arches in your feet.  Starting a new athletic activity.  What are the signs or symptoms? The main symptom of this condition is heel pain. Other symptoms include:  Pain that gets worse after activity or exercise.  Pain that is worse in the morning or after resting.  Pain that goes away after you walk for a few minutes.  How is this diagnosed? This condition may be diagnosed based on your signs and symptoms. Your health care provider will also do a physical exam to check for:  A tender area on the bottom of your foot.  A high arch in your foot.  Pain when you move your foot.  Difficulty moving your foot.  You may also need to have imaging studies to confirm the diagnosis. These can include:  X-rays.  Ultrasound.  MRI.  How is this treated? Treatment for plantar fasciitis depends on the severity of the condition. Your  treatment may include:  Rest, ice, and over-the-counter pain medicines to manage your pain.  Exercises to stretch your calves and your plantar fascia.  A splint that holds your foot in a stretched, upward position while you sleep (night splint).  Physical therapy to relieve symptoms and prevent problems in the future.  Cortisone injections to relieve severe pain.  Extracorporeal shock wave therapy (ESWT) to stimulate damaged plantar fascia with electrical impulses. It is often used as a last resort before surgery.  Surgery, if other treatments have not worked after 12 months.  Follow these instructions at home:  Take medicines only as directed by your health care provider.  Avoid activities that cause pain.  Roll the bottom of your foot over a bag of ice or a bottle of cold water. Do this for 20 minutes, 3-4 times a day.  Perform simple stretches as directed by your health care provider.  Try wearing athletic shoes with air-sole or gel-sole cushions or soft shoe inserts.  Wear a night splint while sleeping, if directed by your health care provider.  Keep all follow-up appointments with your health care provider. How is this prevented?  Do not perform exercises or activities that cause heel pain.  Consider finding low-impact activities if you continue to have problems.  Lose weight if you need to. The best way to prevent plantar fasciitis is to avoid the activities that aggravate your plantar fascia. Contact a health care provider if:  Your symptoms  do not go away after treatment with home care measures.  Your pain gets worse.  Your pain affects your ability to move or do your daily activities. This information is not intended to replace advice given to you by your health care provider. Make sure you discuss any questions you have with your health care provider. Document Released: 06/29/2001 Document Revised: 03/08/2016 Document Reviewed: 08/14/2014 Elsevier Interactive  Patient Education  [employer].

## 2018-01-09 NOTE — Progress Notes (Signed)
Pre visit review using our clinic review tool, if applicable. No additional management support is needed unless otherwise documented below in the visit note. 

## 2018-01-10 ENCOUNTER — Encounter: Payer: Self-pay | Admitting: Internal Medicine

## 2018-01-10 NOTE — Progress Notes (Signed)
Chief Complaint  Patient presents with  . Follow-up   F/u  1. HTN improved on norvasc 5 mg, hctz 25 mg qd 2. Mood somewhat better she has started praying but effexor 37.5 mg qd may be causing reduced libido/sex drive  3. She still c/o slight h/a 4. C/o heel pain 1st thing in am hard to walk 2/2 pain in am  5. C/o outer left breast pain with touch no h/o trauma to this area   Review of Systems  Constitutional: Positive for weight loss.  HENT: Negative for hearing loss.   Respiratory: Negative for shortness of breath.   Cardiovascular: Negative for chest pain.  Gastrointestinal: Negative for abdominal pain.  Musculoskeletal:       +b/l heel pain   Skin: Negative for rash.  Neurological: Positive for headaches.  Psychiatric/Behavioral: Positive for depression.   Past Medical History:  Diagnosis Date  . Anemia    has required 2 units PRBC  . Chicken pox   . Depression    trazadone has not helped in the past   . Headache   . History of blood transfusion 2009   [place]  . Hypertension   . MRSA (methicillin resistant Staphylococcus aureus)   . Sinusitis   . UTI (urinary tract infection)   . Vitamin D deficiency    Past Surgical History:  Procedure Laterality Date  . CESAREAN SECTION     x3 [place]  . CHOLECYSTECTOMY  1998  . 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 NASAL SINUS SURGERY    . uterine ablation  2010   Family History  Problem Relation Age of Onset  . Cancer Mother 58       lung cancer-small cell lung cancer   . Heart disease Mother        MI with stents  . Hypertension Mother   . Diabetes Mother   . COPD Mother   . Hypertension Father   . Hyperlipidemia Father   . Cancer Maternal Aunt        breast cancer  . Cancer Maternal Uncle        colon cancer  . Mental illness Son   . Alcohol abuse Maternal Aunt        Liver Failure  . Alcohol abuse Maternal Grandfather   . Heart disease Maternal Grandfather   . Arthritis  Paternal Grandmother   . Cancer Paternal Grandmother        leukemia   . Hypertension Paternal Grandmother   . Cancer Maternal Grandmother        brain cancer   . Heart disease Paternal Grandfather   . Hypertension Paternal Grandfather    Social History   Socioeconomic History  . Marital status: Married    Spouse name: Not on file  . Number of children: 3  . Years of education: 62  . Highest education level: Not on file  Occupational History  . Occupation: caseworker  Social Needs  . Financial resource strain: Not on file  . Food insecurity:    Worry: Not on file    Inability: Not on file  . Transportation needs:    Medical: Not on file    Non-medical: Not on file  Tobacco Use  . Smoking status: Never Smoker  . Smokeless tobacco: Never Used  Substance and Sexual Activity  . Alcohol use: Yes    Comment: 1 drink weekly  . Drug use: No  .  Sexual activity: Yes    Birth control/protection: None  Lifestyle  . Physical activity:    Days per week: Not on file    Minutes per session: Not on file  . Stress: Not on file  Relationships  . Social connections:    Talks on phone: Not on file    Gets together: Not on file    Attends religious service: Not on file    Active member of club or organization: Not on file    Attends meetings of clubs or organizations: Not on file    Relationship status: Not on file  . Intimate partner violence:    Fear of current or ex partner: Not on file    Emotionally abused: Not on file    Physically abused: Not on file    Forced sexual activity: Not on file  Other Topics Concern  . Not on file  Social History Narrative   Valerie "[name]" grew up in [place], [place]. She attended [employer] and obtained her Bachelors in [employer]. She lives in [place] with her 3 children and her husband       Caffeine - 2 coffee, no sodas   Exercise - not currently   Bachelors degree    Caseworker    4 pregnancies, 3 live births    Feels safe in  relationship    Wears seatbelt   No guns       Current Meds  Medication Sig  . amLODipine (NORVASC) 5 MG tablet Take 1 tablet (5 mg total) by mouth daily.  [name] Kitchen atorvastatin (LIPITOR) 20 MG tablet Take 1 tablet (20 mg total) by mouth daily at 6 PM.  . azithromycin (ZITHROMAX) 250 MG tablet 2 pills day 1 and 1 pill day 2-5  . cetirizine (ZYRTEC) 10 MG tablet Take 10 mg by mouth daily.  . Cholecalciferol 50000 units capsule Take 1 capsule (50,000 Units total) by mouth once a week.  . diphenhydrAMINE (BENADRYL) 25 MG tablet Take 25 mg by mouth daily as needed.  . hydrochlorothiazide (HYDRODIURIL) 25 MG tablet Take 1 tablet (25 mg total) by mouth daily.  . Multiple Vitamins-Minerals (HAIR/SKIN/NAILS PO) Take 1 tablet by mouth daily.  . predniSONE (DELTASONE) 10 MG tablet Take 1 tablet (10 mg total) by mouth daily with breakfast.  . venlafaxine XR (EFFEXOR XR) 37.5 MG 24 hr capsule Take 1 capsule (37.5 mg total) by mouth daily with breakfast.   Allergies  Allergen Reactions  . Aspirin Itching    hives  . Other     ? Nut allergy   . Penicillins Itching    Hives   . Percocet [Oxycodone-Acetaminophen] Hives    Hives   . Latex Rash    Hives    Recent Results (from the past 2160 hour(s))  CBC with Differential/Platelet     Status: Abnormal   Collection Time: 12/16/17  9:42 AM  Result Value Ref Range   WBC 7.1 3.4 - 10.8 x10E3/uL   RBC 4.50 3.77 - 5.28 x10E6/uL   Hemoglobin 13.1 11.1 - 15.9 g/dL   Hematocrit 38.7 34.0 - 46.6 %   MCV 86 79 - 97 fL   MCH 29.1 26.6 - 33.0 pg   MCHC 33.9 31.5 - 35.7 g/dL   RDW 15.6 (H) 12.3 - 15.4 %   Platelets 321 150 - 379 x10E3/uL   Neutrophils 60 Not Estab. %   Lymphs 33 Not Estab. %   Monocytes 4 Not Estab. %   Eos 3 Not Estab. %  Basos 0 Not Estab. %   Neutrophils Absolute 4.2 1.4 - 7.0 x10E3/uL   Lymphocytes Absolute 2.4 0.7 - 3.1 x10E3/uL   Monocytes Absolute 0.3 0.1 - 0.9 x10E3/uL   EOS (ABSOLUTE) 0.2 0.0 - 0.4 x10E3/uL   Basophils  Absolute 0.0 0.0 - 0.2 x10E3/uL   Immature Granulocytes 0 Not Estab. %   Immature Grans (Abs) 0.0 0.0 - 0.1 x10E3/uL  Comprehensive metabolic panel     Status: None   Collection Time: 12/16/17  9:42 AM  Result Value Ref Range   Glucose 92 65 - 99 mg/dL   BUN 8 6 - 24 mg/dL   Creatinine, Ser 0.68 0.57 - 1.00 mg/dL   GFR calc non Af Amer 108 >59 mL/min/1.73   GFR calc Af Amer 125 >59 mL/min/1.73   BUN/Creatinine Ratio 12 9 - 23   Sodium 141 134 - 144 mmol/L   Potassium 4.1 3.5 - 5.2 mmol/L   Chloride 100 96 - 106 mmol/L   CO2 24 20 - 29 mmol/L   Calcium 9.3 8.7 - 10.2 mg/dL   Total Protein 7.5 6.0 - 8.5 g/dL   Albumin 4.5 3.5 - 5.5 g/dL   Globulin, Total 3.0 1.5 - 4.5 g/dL   Albumin/Globulin Ratio 1.5 1.2 - 2.2   Bilirubin Total 0.2 0.0 - 1.2 mg/dL   Alkaline Phosphatase 84 39 - 117 IU/L   AST 21 0 - 40 IU/L   ALT 21 0 - 32 IU/L  Lipid Panel w/o Chol/HDL Ratio     Status: Abnormal   Collection Time: 12/16/17  9:42 AM  Result Value Ref Range   Cholesterol, Total 212 (H) 100 - 199 mg/dL   Triglycerides 116 0 - 149 mg/dL   HDL 49 >39 mg/dL   VLDL Cholesterol Cal 23 5 - 40 mg/dL   LDL Calculated 140 (H) 0 - 99 mg/dL  Iron and TIBC     Status: None   Collection Time: 12/16/17  9:42 AM  Result Value Ref Range   Total Iron Binding Capacity 430 250 - 450 ug/dL   UIBC 359 131 - 425 ug/dL   Iron 71 27 - 159 ug/dL   Iron Saturation 17 15 - 55 %  T4, free     Status: None   Collection Time: 12/16/17  9:42 AM  Result Value Ref Range   Free T4 0.95 0.82 - 1.77 ng/dL  TSH     Status: None   Collection Time: 12/16/17  9:42 AM  Result Value Ref Range   TSH 2.680 0.450 - 4.500 uIU/mL  VITAMIN D 25 Hydroxy (Vit-D Deficiency, Fractures)     Status: Abnormal   Collection Time: 12/16/17  9:42 AM  Result Value Ref Range   Vit D, 25-Hydroxy 7.9 (L) 30.0 - 100.0 ng/mL    Comment: Vitamin D deficiency has been defined by the [place] and an Endocrine Society practice guideline  as a level of serum 25-OH vitamin D less than 20 ng/mL (1,2). The Endocrine Society went on to further define vitamin D insufficiency as a level between 21 and 29 ng/mL (2). 1. IOM (Institute of Medicine). 2010. Dietary reference    intakes for calcium and D. [place]: The    [employer]. 2. Holick MF, Binkley [place], Bischoff-Ferrari HA, et al.    Evaluation, treatment, and prevention of vitamin D    deficiency: an Endocrine Society clinical practice    guideline. JCEM. 2011 Jul; 96(7):1911-30.   Vitamin B12  Status: None   Collection Time: 12/16/17  9:42 AM  Result Value Ref Range   Vitamin B-12 610 232 - 1,245 pg/mL  Ferritin     Status: None   Collection Time: 12/16/17  9:42 AM  Result Value Ref Range   Ferritin 23 15 - 150 ng/mL  Thyroid peroxidase antibody     Status: None   Collection Time: 12/16/17  9:42 AM  Result Value Ref Range   Thyroperoxidase Ab SerPl-aCnc 34 0 - 34 IU/mL  T3, free     Status: None   Collection Time: 12/16/17  9:42 AM  Result Value Ref Range   T3, Free 2.8 2.0 - 4.4 pg/mL   Objective  Body mass index is 40.13 kg/m. Wt Readings from Last 3 Encounters:  01/09/18 256 lb 3.2 oz (116.2 kg)  12/15/17 265 lb (120.2 kg)  11/28/17 263 lb (119.3 kg)   Temp Readings from Last 3 Encounters:  01/09/18 97.7 F (36.5 C) (Oral)  12/15/17 98 F (36.7 C) (Oral)  11/28/17 98.1 F (36.7 C) (Oral)   BP Readings from Last 3 Encounters:  01/09/18 126/90  12/15/17 (!) 158/100  11/28/17 (!) 158/103   Pulse Readings from Last 3 Encounters:  01/09/18 90  12/15/17 74  11/28/17 92   O2 sat room air 95%  Physical Exam  Constitutional: She is oriented to person, place, and time and well-developed, well-nourished, and in no distress. Vital signs are normal.  HENT:  Head: Normocephalic and atraumatic.  Eyes: Pupils are equal, round, and reactive to light. Conjunctivae are normal.  Cardiovascular: Normal rate, regular rhythm and normal heart  sounds.  Pulmonary/Chest: Effort normal and breath sounds normal. Right breast exhibits no inverted nipple, no mass, no nipple discharge, no skin change and no tenderness. Left breast exhibits tenderness. Left breast exhibits no inverted nipple, no mass, no nipple discharge and no skin change. Breasts are symmetrical.    Musculoskeletal:       Feet:  Neurological: She is alert and oriented to person, place, and time. Gait normal. Gait normal.  Skin: Skin is warm, dry and intact.  Psychiatric: Mood, memory, affect and judgment normal.  Nursing note and vitals reviewed.   Assessment   1. HTN  2. Depression  3. H/a unknown etiology  4. B/l heel pain  5. Left breast pain  6. HM Plan  1. Cont norvasc 5 mg qd, hctz 25 mg qd  2. Cont effexor for now disc changing to wellbutrin if sex drive continues to be affected  Disc meditation apps for phone  3. otc meds for now prn tylenol Inc hydration with water  Consider sleep study in future   4. Supportive care heel inserts, stretching ice  Hold on referral foot MD per pt  5. Referred dx mammogram [facility] [place]  6.  Declined flu shot  Tdap given today   Declines hep B status check  Declines STD check   Pap had 09/07/17 Dr. Marcelline Mates get record Refer for mammogram today b/l diag mammo see above  congratuated on wt loss   Provider: Dr. Olivia Mackie McLean-Scocuzza-Internal Medicine

## 2018-01-16 ENCOUNTER — Ambulatory Visit
Admission: RE | Admit: 2018-01-16 | Discharge: 2018-01-16 | Disposition: A | Discharge status: Home / Self Care | Payer: 59 | Source: Ambulatory Visit | Attending: Internal Medicine | Admitting: Internal Medicine

## 2018-01-16 ENCOUNTER — Other Ambulatory Visit: Payer: Self-pay | Admitting: Internal Medicine

## 2018-01-16 DIAGNOSIS — N644 Mastodynia: Secondary | ICD-10-CM

## 2018-01-16 DIAGNOSIS — N6489 Other specified disorders of breast: Secondary | ICD-10-CM

## 2018-03-16 ENCOUNTER — Telehealth: Payer: Self-pay | Admitting: Internal Medicine

## 2018-03-16 NOTE — Telephone Encounter (Signed)
Copied from [place] [id number]. Topic: Quick Communication - Rx Refill/Question >> Mar 16, 2018 12:24 PM Scherrie Gerlach wrote: Medication: venlafaxine XR (EFFEXOR XR) 37.5 MG 24 hr capsule  Has the patient contacted their pharmacy? yes [facility] ([facility] [place] [phone] (Phone) [phone] (Fax)

## 2018-03-17 ENCOUNTER — Other Ambulatory Visit: Payer: Self-pay

## 2018-03-17 DIAGNOSIS — F419 Anxiety disorder, unspecified: Secondary | ICD-10-CM

## 2018-03-17 DIAGNOSIS — F329 Major depressive disorder, single episode, unspecified: Secondary | ICD-10-CM

## 2018-03-17 MED ORDER — VENLAFAXINE HCL ER 37.5 MG PO CP24: 37.5000 mg | ORAL_CAPSULE | Freq: Every day | ORAL | 2 refills | Status: DC

## 2018-04-11 ENCOUNTER — Encounter: Payer: Self-pay | Admitting: Internal Medicine

## 2018-04-11 ENCOUNTER — Ambulatory Visit: Payer: 59 | Admitting: Internal Medicine

## 2018-04-11 ENCOUNTER — Other Ambulatory Visit: Payer: Self-pay | Admitting: Internal Medicine

## 2018-04-11 VITALS — BP 124/82 | HR 87 | Temp 98.3°F | Ht 67.0 in | Wt 252.0 lb

## 2018-04-11 DIAGNOSIS — G8929 Other chronic pain: Secondary | ICD-10-CM

## 2018-04-11 DIAGNOSIS — E559 Vitamin D deficiency, unspecified: Secondary | ICD-10-CM | POA: Insufficient documentation

## 2018-04-11 DIAGNOSIS — F329 Major depressive disorder, single episode, unspecified: Secondary | ICD-10-CM

## 2018-04-11 DIAGNOSIS — J329 Chronic sinusitis, unspecified: Secondary | ICD-10-CM | POA: Diagnosis not present

## 2018-04-11 DIAGNOSIS — R928 Other abnormal and inconclusive findings on diagnostic imaging of breast: Secondary | ICD-10-CM | POA: Insufficient documentation

## 2018-04-11 DIAGNOSIS — Z6839 Body mass index (BMI) 39.0-39.9, adult: Secondary | ICD-10-CM

## 2018-04-11 DIAGNOSIS — R51 Headache: Secondary | ICD-10-CM

## 2018-04-11 DIAGNOSIS — E785 Hyperlipidemia, unspecified: Secondary | ICD-10-CM

## 2018-04-11 DIAGNOSIS — I10 Essential (primary) hypertension: Secondary | ICD-10-CM

## 2018-04-11 DIAGNOSIS — F419 Anxiety disorder, unspecified: Secondary | ICD-10-CM

## 2018-04-11 LAB — LIPID PANEL
Cholesterol: 141 mg/dL (ref 0–200)
HDL: 54.7 mg/dL (ref >39.00)
LDL Cholesterol: 54 mg/dL (ref 0–99)
NonHDL: 86.35
TRIGLYCERIDES: 164 mg/dL — AB (ref 0.0–149.0)
Total CHOL/HDL Ratio: 3
VLDL: 32.8 mg/dL (ref 0.0–40.0)

## 2018-04-11 LAB — BASIC METABOLIC PANEL
BUN: 9 mg/dL (ref 6–23)
CALCIUM: 9.2 mg/dL (ref 8.4–10.5)
CO2: 29 mEq/L (ref 19–32)
CREATININE: 0.68 mg/dL (ref 0.40–1.20)
Chloride: 97 mEq/L (ref 96–112)
GFR: 121.54 mL/min (ref >60.00)
Glucose, Bld: 101 mg/dL — ABNORMAL HIGH (ref 70–99)
Potassium: 3.3 mEq/L — ABNORMAL LOW (ref 3.5–5.1)
SODIUM: 137 meq/L (ref 135–145)

## 2018-04-11 MED ORDER — HYDROCHLOROTHIAZIDE 25 MG PO TABS: 25.0000 mg | ORAL_TABLET | Freq: Every day | ORAL | 3 refills | Status: DC

## 2018-04-11 MED ORDER — ATORVASTATIN CALCIUM 10 MG PO TABS: 10.0000 mg | ORAL_TABLET | Freq: Every day | ORAL | 3 refills | Status: DC

## 2018-04-11 NOTE — Progress Notes (Signed)
Chief Complaint  Patient presents with  . Follow-up   F/u  1. HTN controlled on norvasc 5 and hctz 25  2. Vit D def completed 3 months weekly needs to pick up other 3 monhts  3. C/o h/a x 2 days frontal/sinus yesterday had to leave work. No h/o migraines she does snore at night unsure if stops breathing  4. Obesity down 4 more lbs  5. Mood improved on effexor 37.5  6. Dental work on cleocin tid x 10 days  7. Abnormal mammogram 03/18/18 with lymph node and fibroglandular tissue needs repeat in 6 months    Review of Systems  Constitutional: Positive for weight loss.  HENT: Negative for hearing loss.   Eyes: Negative for blurred vision.  Respiratory: Negative for shortness of breath.   Cardiovascular: Negative for chest pain.  Skin: Negative for rash.  Neurological: Positive for headaches.  Psychiatric/Behavioral: Negative for depression.   Past Medical History:  Diagnosis Date  . Anemia    has required 2 units PRBC  . Chicken pox   . Depression    trazadone has not helped in the past   . Headache   . History of blood transfusion 2009   [place]  . Hypertension   . MRSA (methicillin resistant Staphylococcus aureus)   . Sinusitis   . UTI (urinary tract infection)   . Vitamin D deficiency    Past Surgical History:  Procedure Laterality Date  . CESAREAN SECTION     x3 [place]  . CHOLECYSTECTOMY  1998  . 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 NASAL SINUS SURGERY    . uterine ablation  2010   Family History  Problem Relation Age of Onset  . Cancer Mother 58       lung cancer-small cell lung cancer   . Heart disease Mother        MI with stents  . Hypertension Mother   . Diabetes Mother   . COPD Mother   . Hypertension Father   . Hyperlipidemia Father   . Cancer Maternal Aunt        breast cancer  . Breast cancer Maternal Aunt   . Cancer Maternal Uncle        colon cancer  . Mental illness Son   . Alcohol abuse Maternal Aunt       Liver Failure  . Alcohol abuse Maternal Grandfather   . Heart disease Maternal Grandfather   . Arthritis Paternal Grandmother   . Cancer Paternal Grandmother        leukemia   . Hypertension Paternal Grandmother   . Breast cancer Paternal Grandmother   . Cancer Maternal Grandmother        brain cancer   . Heart disease Paternal Grandfather   . Hypertension Paternal Grandfather    Social History   Socioeconomic History  . Marital status: Married    Spouse name: Not on file  . Number of children: 3  . Years of education: 68  . Highest education level: Not on file  Occupational History  . Occupation: caseworker  Social Needs  . Financial resource strain: Not on file  . Food insecurity:    Worry: Not on file    Inability: Not on file  . Transportation needs:    Medical: Not on file    Non-medical: Not on file  Tobacco Use  . Smoking status: Never Smoker  . Smokeless tobacco: Never  Used  Substance and Sexual Activity  . Alcohol use: Yes    Comment: 1 drink weekly  . Drug use: No  . Sexual activity: Yes    Birth control/protection: None  Lifestyle  . Physical activity:    Days per week: Not on file    Minutes per session: Not on file  . Stress: Not on file  Relationships  . Social connections:    Talks on phone: Not on file    Gets together: Not on file    Attends religious service: Not on file    Active member of club or organization: Not on file    Attends meetings of clubs or organizations: Not on file    Relationship status: Not on file  . Intimate partner violence:    Fear of current or ex partner: Not on file    Emotionally abused: Not on file    Physically abused: Not on file    Forced sexual activity: Not on file  Other Topics Concern  . Not on file  Social History Narrative   Valerie "[name]" grew up in [place], [place]. She attended [employer] and obtained her Bachelors in [employer]. She lives in [place] with her 3 children and her husband        Caffeine - 2 coffee, no sodas   Exercise - not currently   Bachelors degree    Caseworker    4 pregnancies, 3 live births    Feels safe in relationship    Wears seatbelt   No guns       Current Meds  Medication Sig  . amLODipine (NORVASC) 5 MG tablet Take 1 tablet (5 mg total) by mouth daily.  [name] Kitchen atorvastatin (LIPITOR) 20 MG tablet Take 1 tablet (20 mg total) by mouth daily at 6 PM.  . cetirizine (ZYRTEC) 10 MG tablet Take 10 mg by mouth daily.  . Cholecalciferol 50000 units capsule Take 1 capsule (50,000 Units total) by mouth once a week.  . diphenhydrAMINE (BENADRYL) 25 MG tablet Take 25 mg by mouth daily as needed.  . hydrochlorothiazide (HYDRODIURIL) 25 MG tablet Take 1 tablet (25 mg total) by mouth daily.  . Multiple Vitamins-Minerals (HAIR/SKIN/NAILS PO) Take 1 tablet by mouth daily.  [name] Kitchen venlafaxine XR (EFFEXOR XR) 37.5 MG 24 hr capsule Take 1 capsule (37.5 mg total) by mouth daily with breakfast.   Allergies  Allergen Reactions  . Aspirin Itching    hives  . Other     ? Nut allergy   . Penicillins Itching    Hives   . Percocet [Oxycodone-Acetaminophen] Hives    Hives   . Latex Rash    Hives    No results found for this or any previous visit (from the past 2160 hour(s)). Objective  Body mass index is 39.47 kg/m. Wt Readings from Last 3 Encounters:  04/11/18 252 lb (114.3 kg)  01/09/18 256 lb 3.2 oz (116.2 kg)  12/15/17 265 lb (120.2 kg)   Temp Readings from Last 3 Encounters:  04/11/18 98.3 F (36.8 C) (Oral)  01/09/18 97.7 F (36.5 C) (Oral)  12/15/17 98 F (36.7 C) (Oral)   BP Readings from Last 3 Encounters:  04/11/18 124/82  01/09/18 126/90  12/15/17 (!) 158/100   Pulse Readings from Last 3 Encounters:  04/11/18 87  01/09/18 90  12/15/17 74    Physical Exam  Constitutional: She is oriented to person, place, and time. Vital signs are normal. She appears well-developed and well-nourished. She is cooperative.  HENT:  Head: Normocephalic and  atraumatic.  Nose: Right sinus exhibits maxillary sinus tenderness. Right sinus exhibits no frontal sinus tenderness. Left sinus exhibits maxillary sinus tenderness. Left sinus exhibits no frontal sinus tenderness.  Mouth/Throat: Oropharynx is clear and moist and mucous membranes are normal.  +ethmoid ttp   Eyes: Pupils are equal, round, and reactive to light. Conjunctivae are normal.  Cardiovascular: Normal rate, regular rhythm and normal heart sounds.  Pulmonary/Chest: Effort normal and breath sounds normal.  Neurological: She is alert and oriented to person, place, and time. Gait normal.  Skin: Skin is warm, dry and intact.  Psychiatric: She has a normal mood and affect. Her speech is normal and behavior is normal. Judgment and thought content normal. Cognition and memory are normal.  Nursing note and vitals reviewed.   Assessment   1. HTN/HLD 2. H/a r/o OSA related or sinus related  3. Vit D def  4. Obesity 39.47  5. Depression improved  6. Abnormal mammogram  7. HM Plan   1.  Cont meds norvasc 5, hctz 25 mg qd  Check BMET and lipid 2.  CT head and sinus  If + refer to [facility] ent and consider sleep study if CT sinus negative to w/u am h/as  3.  rec pick up D3 and after in 3 months done 5000 IU qd D3  4. Congratulated on wt loss  5.  Cont meds  6.  Mammogram sch  7.  Declined flu shot  Tdap had Declines hep B status check and MMR check Declines STD check   Pap had 09/07/17 Dr. Marcelline Mates get record mammo abnormal 01/2018 repeat dx mammo and [place] left  congratuated on wt loss     Provider: Dr. Olivia Mackie McLean-Scocuzza-Internal Medicine

## 2018-04-11 NOTE — Patient Instructions (Signed)
Pick up vitamin D please after this is complete D3 OTC 5000 IU daily  CT head and sinuses  F/u in 4 months sooner if needed    Cholesterol Cholesterol is a white, waxy, fat-like substance that is needed by the human body in small amounts. The liver makes all the cholesterol we need. Cholesterol is carried from the liver by the blood through the blood vessels. Deposits of cholesterol (plaques) may build up on blood vessel (artery) walls. Plaques make the arteries narrower and stiffer. Cholesterol plaques increase the risk for heart attack and stroke. You cannot feel your cholesterol level even if it is very high. The only way to know that it is high is to have a blood test. Once you know your cholesterol levels, you should keep a record of the test results. Work with your health care provider to keep your levels in the desired range. What do the results mean?  Total cholesterol is a rough measure of all the cholesterol in your blood.  LDL (low-density lipoprotein) is the "bad" cholesterol. This is the type that causes plaque to build up on the artery walls. You want this level to be low.  HDL (high-density lipoprotein) is the "good" cholesterol because it cleans the arteries and carries the LDL away. You want this level to be high.  Triglycerides are fat that the body can either burn for energy or store. High levels are closely linked to heart disease. What are the desired levels of cholesterol?  Total cholesterol below 200.  LDL below 100 for people who are at risk, below 70 for people at very high risk.  HDL above 40 is good. A level of 60 or higher is considered to be protective against heart disease.  Triglycerides below 150. How can I lower my cholesterol? Diet Follow your diet program as told by your health care provider.  Choose fish or white meat chicken and [place], roasted or baked. Limit fatty cuts of red meat, fried foods, and processed meats, such as sausage and lunch  meats.  Eat lots of fresh fruits and vegetables.  Choose whole grains, beans, pasta, potatoes, and cereals.  Choose olive oil, corn oil, or canola oil, and use only small amounts.  Avoid butter, mayonnaise, shortening, or palm kernel oils.  Avoid foods with trans fats.  Drink skim or nonfat milk and eat low-fat or nonfat yogurt and cheeses. Avoid whole milk, cream, ice cream, egg yolks, and full-fat cheeses.  Healthier desserts include angel food cake, ginger snaps, animal crackers, hard candy, popsicles, and low-fat or nonfat frozen yogurt. Avoid pastries, cakes, pies, and cookies.  Exercise  Follow your exercise program as told by your health care provider. A regular program: ? Helps to decrease LDL and raise HDL. ? Helps with weight control.  Do things that increase your activity level, such as gardening, walking, and taking the stairs.  Ask your health care provider about ways that you can be more active in your daily life.  Medicine  Take over-the-counter and prescription medicines only as told by your health care provider. ? Medicine may be prescribed by your health care provider to help lower cholesterol and decrease the risk for heart disease. This is usually done if diet and exercise have failed to bring down cholesterol levels. ? If you have several risk factors, you may need medicine even if your levels are normal.  This information is not intended to replace advice given to you by your health care provider. Make sure  you discuss any questions you have with your health care provider. Document Released: 06/29/2001 Document Revised: 05/01/2016 Document Reviewed: 04/03/2016 Elsevier Interactive Patient Education  [employer].

## 2018-04-13 ENCOUNTER — Telehealth: Payer: Self-pay | Admitting: *Deleted

## 2018-04-13 NOTE — Telephone Encounter (Signed)
Copied from [place] [phone]. Topic: General - Other >> Apr 13, 2018 12:31 PM Mcneil, Ja-Kwan wrote: Reason for CRM: Pt returned call to the office. Pt requests a call back at the work# [phone].

## 2018-04-13 NOTE — Telephone Encounter (Signed)
Please see Lab result.

## 2018-04-24 ENCOUNTER — Ambulatory Visit: Admission: RE | Admit: 2018-04-24 | Payer: 59 | Source: Ambulatory Visit

## 2018-04-24 ENCOUNTER — Ambulatory Visit
Admission: RE | Admit: 2018-04-24 | Discharge: 2018-04-24 | Disposition: A | Discharge status: Home / Self Care | Payer: 59 | Source: Ambulatory Visit | Attending: Internal Medicine | Admitting: Internal Medicine

## 2018-04-24 DIAGNOSIS — J3489 Other specified disorders of nose and nasal sinuses: Secondary | ICD-10-CM | POA: Insufficient documentation

## 2018-04-24 DIAGNOSIS — J329 Chronic sinusitis, unspecified: Secondary | ICD-10-CM

## 2018-04-24 DIAGNOSIS — R51 Headache: Secondary | ICD-10-CM | POA: Diagnosis not present

## 2018-04-24 DIAGNOSIS — G8929 Other chronic pain: Secondary | ICD-10-CM

## 2018-04-25 ENCOUNTER — Telehealth: Payer: Self-pay | Admitting: Internal Medicine

## 2018-04-25 NOTE — Telephone Encounter (Signed)
Patient returning call for lab results. Please advise.     Copied from [place] [phone]. Topic: Quick Communication - Lab Results >> Apr 25, 2018 10:16 AM Babs Bertin, CMA wrote: Called patient to inform them of [place] lab results. When patient returns call, triage nurse may disclose results.

## 2018-04-25 NOTE — Telephone Encounter (Signed)
Charted in result notes. 

## 2018-04-27 ENCOUNTER — Other Ambulatory Visit: Payer: Self-pay | Admitting: Internal Medicine

## 2018-04-27 DIAGNOSIS — J329 Chronic sinusitis, unspecified: Secondary | ICD-10-CM

## 2018-04-27 DIAGNOSIS — R930 Abnormal findings on diagnostic imaging of skull and head, not elsewhere classified: Secondary | ICD-10-CM

## 2018-05-15 ENCOUNTER — Encounter: Payer: Self-pay | Admitting: Internal Medicine

## 2018-05-15 DIAGNOSIS — J309 Allergic rhinitis, unspecified: Secondary | ICD-10-CM | POA: Diagnosis not present

## 2018-05-15 DIAGNOSIS — J329 Chronic sinusitis, unspecified: Secondary | ICD-10-CM | POA: Diagnosis not present

## 2018-05-15 DIAGNOSIS — J339 Nasal polyp, unspecified: Secondary | ICD-10-CM | POA: Diagnosis not present

## 2018-05-17 ENCOUNTER — Telehealth: Payer: Self-pay

## 2018-05-17 NOTE — Telephone Encounter (Signed)
Copied from [place] [phone]. Topic: Inquiry >> May 17, 2018  2:51 PM Scherrie Gerlach wrote: Reason for CRM: pt wants to know if OK to be off her venlafaxine XR (EFFEXOR XR) 37.5 MG 24 hr capsule for 3 days prior to an allergy test on 05/30/18. They are requesting she do this, pt wants to confirm ok.

## 2018-05-17 NOTE — Telephone Encounter (Signed)
1-2 weeks before take every other day and then stop 3 days prior to testing and resume after testing  This will be ok   [place]

## 2018-05-18 ENCOUNTER — Other Ambulatory Visit: Payer: Self-pay | Admitting: Internal Medicine

## 2018-05-18 DIAGNOSIS — I10 Essential (primary) hypertension: Secondary | ICD-10-CM

## 2018-05-18 DIAGNOSIS — M25572 Pain in left ankle and joints of left foot: Principal | ICD-10-CM

## 2018-05-18 DIAGNOSIS — G8929 Other chronic pain: Secondary | ICD-10-CM

## 2018-05-18 MED ORDER — AMLODIPINE BESYLATE 5 MG PO TABS: 5.0000 mg | ORAL_TABLET | Freq: Every day | ORAL | 3 refills | Status: DC

## 2018-05-18 NOTE — Telephone Encounter (Signed)
° ° °  Spoke with pt she is aware of Dr Mclean-Scocuzza recommendations

## 2018-05-18 NOTE — Telephone Encounter (Signed)
Called and left voicemail for patient to call office back. Ok for [facility] to give patient Dr . Claris Gladden recommendations.

## 2018-05-30 DIAGNOSIS — J301 Allergic rhinitis due to pollen: Secondary | ICD-10-CM | POA: Diagnosis not present

## 2018-06-08 DIAGNOSIS — J339 Nasal polyp, unspecified: Secondary | ICD-10-CM | POA: Diagnosis not present

## 2018-06-08 DIAGNOSIS — J309 Allergic rhinitis, unspecified: Secondary | ICD-10-CM | POA: Diagnosis not present

## 2018-06-09 DIAGNOSIS — J301 Allergic rhinitis due to pollen: Secondary | ICD-10-CM | POA: Diagnosis not present

## 2018-06-15 DIAGNOSIS — J301 Allergic rhinitis due to pollen: Secondary | ICD-10-CM | POA: Diagnosis not present

## 2018-06-20 ENCOUNTER — Other Ambulatory Visit: Payer: Self-pay | Admitting: Internal Medicine

## 2018-06-20 DIAGNOSIS — F419 Anxiety disorder, unspecified: Secondary | ICD-10-CM

## 2018-06-20 DIAGNOSIS — F329 Major depressive disorder, single episode, unspecified: Secondary | ICD-10-CM

## 2018-06-20 DIAGNOSIS — M722 Plantar fascial fibromatosis: Secondary | ICD-10-CM | POA: Diagnosis not present

## 2018-06-20 DIAGNOSIS — F32A Depression, unspecified: Secondary | ICD-10-CM

## 2018-06-20 DIAGNOSIS — M25572 Pain in left ankle and joints of left foot: Secondary | ICD-10-CM | POA: Diagnosis not present

## 2018-06-20 MED ORDER — VENLAFAXINE HCL ER 37.5 MG PO CP24: 37.5000 mg | ORAL_CAPSULE | Freq: Every day | ORAL | 3 refills | Status: DC

## 2018-06-22 DIAGNOSIS — J301 Allergic rhinitis due to pollen: Secondary | ICD-10-CM | POA: Diagnosis not present

## 2018-07-19 ENCOUNTER — Ambulatory Visit
Admission: RE | Admit: 2018-07-19 | Discharge: 2018-07-19 | Disposition: A | Discharge status: Home / Self Care | Payer: 59 | Source: Ambulatory Visit | Attending: Internal Medicine | Admitting: Internal Medicine

## 2018-07-19 ENCOUNTER — Other Ambulatory Visit: Payer: Self-pay | Admitting: Internal Medicine

## 2018-07-19 ENCOUNTER — Ambulatory Visit: Admission: RE | Admit: 2018-07-19 | Payer: 59 | Source: Ambulatory Visit

## 2018-07-19 DIAGNOSIS — R928 Other abnormal and inconclusive findings on diagnostic imaging of breast: Secondary | ICD-10-CM | POA: Diagnosis not present

## 2018-07-19 DIAGNOSIS — N6489 Other specified disorders of breast: Secondary | ICD-10-CM

## 2018-07-20 DIAGNOSIS — J301 Allergic rhinitis due to pollen: Secondary | ICD-10-CM | POA: Diagnosis not present

## 2018-07-24 DIAGNOSIS — J301 Allergic rhinitis due to pollen: Secondary | ICD-10-CM | POA: Diagnosis not present

## 2018-07-27 DIAGNOSIS — J301 Allergic rhinitis due to pollen: Secondary | ICD-10-CM | POA: Diagnosis not present

## 2018-07-31 DIAGNOSIS — J301 Allergic rhinitis due to pollen: Secondary | ICD-10-CM | POA: Diagnosis not present

## 2018-08-03 DIAGNOSIS — J301 Allergic rhinitis due to pollen: Secondary | ICD-10-CM | POA: Diagnosis not present

## 2018-08-04 DIAGNOSIS — J301 Allergic rhinitis due to pollen: Secondary | ICD-10-CM | POA: Diagnosis not present

## 2018-08-10 DIAGNOSIS — J301 Allergic rhinitis due to pollen: Secondary | ICD-10-CM | POA: Diagnosis not present

## 2018-08-11 ENCOUNTER — Ambulatory Visit: Payer: 59 | Admitting: Internal Medicine

## 2018-08-11 ENCOUNTER — Encounter: Payer: Self-pay | Admitting: Internal Medicine

## 2018-08-11 VITALS — BP 116/78 | HR 101 | Temp 98.1°F | Ht 67.0 in | Wt 256.4 lb

## 2018-08-11 DIAGNOSIS — F419 Anxiety disorder, unspecified: Secondary | ICD-10-CM

## 2018-08-11 DIAGNOSIS — Z6839 Body mass index (BMI) 39.0-39.9, adult: Secondary | ICD-10-CM

## 2018-08-11 DIAGNOSIS — R928 Other abnormal and inconclusive findings on diagnostic imaging of breast: Secondary | ICD-10-CM | POA: Diagnosis not present

## 2018-08-11 DIAGNOSIS — F329 Major depressive disorder, single episode, unspecified: Secondary | ICD-10-CM

## 2018-08-11 DIAGNOSIS — F32A Depression, unspecified: Secondary | ICD-10-CM

## 2018-08-11 DIAGNOSIS — J309 Allergic rhinitis, unspecified: Secondary | ICD-10-CM | POA: Diagnosis not present

## 2018-08-11 DIAGNOSIS — J329 Chronic sinusitis, unspecified: Secondary | ICD-10-CM | POA: Insufficient documentation

## 2018-08-11 MED ORDER — MONTELUKAST SODIUM 10 MG PO TABS: 10.0000 mg | ORAL_TABLET | Freq: Every day | ORAL | 3 refills | Status: DC

## 2018-08-11 NOTE — Patient Instructions (Signed)
General Headache Without Cause A headache is pain or discomfort felt around the head or neck area. The specific cause of a headache may not be found. There are many causes and types of headaches. A few common ones are:  Tension headaches.  Migraine headaches.  Cluster headaches.  Chronic daily headaches.  Follow these instructions at home: Watch your condition for any changes. Take these steps to help with your condition: Managing pain  Take over-the-counter and prescription medicines only as told by your health care provider.  Lie down in a dark, quiet room when you have a headache.  If directed, apply ice to the head and neck area: ? Put ice in a plastic bag. ? Place a towel between your skin and the bag. ? Leave the ice on for 20 minutes, 2-3 times per day.  Use a heating pad or hot shower to apply heat to the head and neck area as told by your health care provider.  Keep lights dim if bright lights bother you or make your headaches worse. Eating and drinking  Eat meals on a regular schedule.  Limit alcohol use.  Decrease the amount of caffeine you drink, or stop drinking caffeine. General instructions  Keep all follow-up visits as told by your health care provider. This is important.  Keep a headache journal to help find out what may trigger your headaches. For example, write down: ? What you eat and drink. ? How much sleep you get. ? Any change to your diet or medicines.  Try massage or other relaxation techniques.  Limit stress.  Sit up straight, and do not tense your muscles.  Do not use tobacco products, including cigarettes, chewing tobacco, or e-cigarettes. If you need help quitting, ask your health care provider.  Exercise regularly as told by your health care provider.  Sleep on a regular schedule. Get 7-9 hours of sleep, or the amount recommended by your health care provider. Contact a health care provider if:  Your symptoms are not helped by  medicine.  You have a headache that is different from the usual headache.  You have nausea or you vomit.  You have a fever. Get help right away if:  Your headache becomes severe.  You have repeated vomiting.  You have a stiff neck.  You have a loss of vision.  You have problems with speech.  You have pain in the eye or ear.  You have muscular weakness or loss of muscle control.  You lose your balance or have trouble walking.  You feel faint or pass out.  You have confusion. This information is not intended to replace advice given to you by your health care provider. Make sure you discuss any questions you have with your health care provider. Document Released: 10/04/2005 Document Revised: 03/11/2016 Document Reviewed: 01/27/2015 Elsevier Interactive Patient Education  2018 Elsevier Inc.  

## 2018-08-11 NOTE — [place] Notes (Signed)
Chief Complaint  Patient presents with  . Follow-up   F/u  1. HTN controlled norvasc 5 mg qd, hctz 25 mg qd  2. Allergic rhinitis and chronic sinusitis appt with ENT 10/10/18 getting allergy shots but after shots she has arm swelling  3. C/o ha 3 am this am back of head took tylenol and helped and now resolved  4. Anxiety and depression of effexor 37.5 mg qd and helping     Review of Systems  Constitutional: Negative for weight loss.  HENT: Negative for sinus pain.   Respiratory: Negative for shortness of breath.   Cardiovascular: Negative for chest pain.  Gastrointestinal: Negative for abdominal pain.  Neurological: Positive for headaches.  Psychiatric/Behavioral: Negative for depression.   Past Medical History:  Diagnosis Date  . Anemia    has required 2 units PRBC  . Chicken pox   . Depression    trazadone has not helped in the past   . Headache   . History of blood transfusion 2009   [place]  . Hypertension   . MRSA (methicillin resistant Staphylococcus aureus)   . Sinusitis   . Sinusitis, chronic   . UTI (urinary tract infection)   . Vitamin D deficiency    Past Surgical History:  Procedure Laterality Date  . CESAREAN SECTION     x3 [name]  . CHOLECYSTECTOMY  1998  . 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 NASAL SINUS SURGERY     2009/2010  . uterine ablation  2010   Family History  Problem Relation Age of Onset  . Cancer Mother 65       lung cancer-small cell lung cancer   . Heart disease Mother        MI with stents  . Hypertension Mother   . Diabetes Mother   . COPD Mother   . Hypertension Father   . Hyperlipidemia Father   . Cancer Maternal Aunt        breast cancer  . Breast cancer Maternal Aunt   . Cancer Maternal Uncle        colon cancer  . Mental illness Son   . Alcohol abuse Maternal Aunt        Liver Failure  . Alcohol abuse Maternal Grandfather   . Heart disease Maternal Grandfather   . Arthritis  Paternal Grandmother   . Cancer Paternal Grandmother        leukemia   . Hypertension Paternal Grandmother   . Breast cancer Paternal Grandmother   . Cancer Maternal Grandmother        brain cancer   . Heart disease Paternal Grandfather   . Hypertension Paternal Grandfather    Social History   Socioeconomic History  . Marital status: Married    Spouse name: Not on file  . Number of children: 3  . Years of education: 41  . Highest education level: Not on file  Occupational History  . Occupation: caseworker  Social Needs  . Financial resource strain: Not on file  . Food insecurity:    Worry: Not on file    Inability: Not on file  . Transportation needs:    Medical: Not on file    Non-medical: Not on file  Tobacco Use  . Smoking status: Never Smoker  . Smokeless tobacco: Never Used  Substance and Sexual Activity  . Alcohol use: Yes    Comment: 1 drink weekly  . Drug use:  No  . Sexual activity: Yes    Birth control/protection: None  Lifestyle  . Physical activity:    Days per week: Not on file    Minutes per session: Not on file  . Stress: Not on file  Relationships  . Social connections:    Talks on phone: Not on file    Gets together: Not on file    Attends religious service: Not on file    Active member of club or organization: Not on file    Attends meetings of clubs or organizations: Not on file    Relationship status: Not on file  . Intimate partner violence:    Fear of current or ex partner: Not on file    Emotionally abused: Not on file    Physically abused: Not on file    Forced sexual activity: Not on file  Other Topics Concern  . Not on file  Social History Narrative   Valerie "Valerie Morales" grew up in [place], [place]. She attended [employer] and obtained her Bachelors in [employer]. She lives in [place] with her 3 children and her husband       Caffeine - 2 coffee, no sodas   Exercise - not currently   Bachelors degree    Caseworker    4  pregnancies, 3 live births    Feels safe in relationship    Wears seatbelt   No guns       Current Meds  Medication Sig  . amLODipine (NORVASC) 5 MG tablet Take 1 tablet (5 mg total) by mouth daily.  [name] Kitchen atorvastatin (LIPITOR) 10 MG tablet Take 1 tablet (10 mg total) by mouth daily at 6 PM.  . cetirizine (ZYRTEC) 10 MG tablet Take 10 mg by mouth daily.  . Cholecalciferol 50000 units capsule Take 1 capsule (50,000 Units total) by mouth once a week.  . clindamycin (CLEOCIN) 150 MG capsule TAKE 1 CAPSULE BY MOUTH THREE TIMES DAILY UNTIL GONE  . diphenhydrAMINE (BENADRYL) 25 MG tablet Take 25 mg by mouth daily as needed.  . hydrochlorothiazide (HYDRODIURIL) 25 MG tablet Take 1 tablet (25 mg total) by mouth daily.  . Multiple Vitamins-Minerals (HAIR/SKIN/NAILS PO) Take 1 tablet by mouth daily.  [name] Kitchen venlafaxine XR (EFFEXOR XR) 37.5 MG 24 hr capsule Take 1 capsule (37.5 mg total) by mouth daily with breakfast.   Allergies  Allergen Reactions  . Aspirin Itching    hives  . Other     ? Nut allergy   . Penicillins Itching    Hives   . Percocet [Oxycodone-Acetaminophen] Hives    Hives   . Latex Rash    Hives    No results found for this or any previous visit (from the past 2160 hour(s)). Objective  Body mass index is 40.16 kg/m. Wt Readings from Last 3 Encounters:  08/11/18 256 lb 6.4 oz (116.3 kg)  04/11/18 252 lb (114.3 kg)  01/09/18 256 lb 3.2 oz (116.2 kg)   Temp Readings from Last 3 Encounters:  08/11/18 98.1 F (36.7 C) (Oral)  04/11/18 98.3 F (36.8 C) (Oral)  01/09/18 97.7 F (36.5 C) (Oral)   BP Readings from Last 3 Encounters:  08/11/18 116/78  04/11/18 124/82  01/09/18 126/90   Pulse Readings from Last 3 Encounters:  08/11/18 (!) 101  04/11/18 87  01/09/18 90    Physical Exam  Constitutional: She is oriented to person, place, and time. Vital signs are normal. She appears well-developed and well-nourished. She is cooperative.  HENT:  Head:  Normocephalic and  atraumatic.  Mouth/Throat: Oropharynx is clear and moist and mucous membranes are normal.  Eyes: Pupils are equal, round, and reactive to light. Conjunctivae are normal.  Cardiovascular: Normal rate, regular rhythm and normal heart sounds.  Pulmonary/Chest: Effort normal and breath sounds normal.  Musculoskeletal:       Cervical back: She exhibits normal range of motion and no tenderness.  Neurological: She is alert and oriented to person, place, and time. Gait normal.  Skin: Skin is warm, dry and intact.  Psychiatric: She has a normal mood and affect. Her speech is normal and behavior is normal. Judgment and thought content normal. Cognition and memory are normal.  Nursing note and vitals reviewed.   Assessment   1. HTN controlled  2. Allergic rhinitis and chronic sinusitis  3. Headache resolved  4. Anxiety and depression improved  5. HM Plan   1. Cont meds  2. Add singulair to zyrtec and allergic shots ent f/u 10/10/18  3. Monitor  Neg MSK pain on exam  4. Improved on effexor 37.5 mg qd  5.  Declinedflu shot  Tdap had Declines hep B status check and MMR check Declines STD check   Do fasting labs at f/u   Pap had 09/07/17 Dr. Marcelline Mates get record mammo abnormal 01/2018 repeat dx mammo and [place] left, had 07/19/18 repeat ordered 01/18/2019  rec weight loss    Provider: Dr. Olivia Mackie McLean-Scocuzza-Internal Medicine

## 2018-08-11 NOTE — Progress Notes (Signed)
Pre visit review using our clinic review tool, if applicable. No additional management support is needed unless otherwise documented below in the visit note. 

## 2018-08-14 DIAGNOSIS — J301 Allergic rhinitis due to pollen: Secondary | ICD-10-CM | POA: Diagnosis not present

## 2018-08-17 DIAGNOSIS — J301 Allergic rhinitis due to pollen: Secondary | ICD-10-CM | POA: Diagnosis not present

## 2018-08-21 DIAGNOSIS — J301 Allergic rhinitis due to pollen: Secondary | ICD-10-CM | POA: Diagnosis not present

## 2018-08-24 DIAGNOSIS — J301 Allergic rhinitis due to pollen: Secondary | ICD-10-CM | POA: Diagnosis not present

## 2018-08-28 DIAGNOSIS — J301 Allergic rhinitis due to pollen: Secondary | ICD-10-CM | POA: Diagnosis not present

## 2019-01-18 ENCOUNTER — Other Ambulatory Visit: Payer: Self-pay

## 2019-01-18 ENCOUNTER — Ambulatory Visit
Admission: RE | Admit: 2019-01-18 | Discharge: 2019-01-18 | Disposition: A | Discharge status: Home / Self Care | Payer: 59 | Source: Ambulatory Visit | Attending: Internal Medicine | Admitting: Internal Medicine

## 2019-01-18 ENCOUNTER — Ambulatory Visit (INDEPENDENT_AMBULATORY_CARE_PROVIDER_SITE_OTHER): Payer: 59 | Admitting: Internal Medicine

## 2019-01-18 ENCOUNTER — Encounter: Payer: Self-pay | Admitting: Internal Medicine

## 2019-01-18 ENCOUNTER — Ambulatory Visit: Payer: 59

## 2019-01-18 DIAGNOSIS — F329 Major depressive disorder, single episode, unspecified: Secondary | ICD-10-CM

## 2019-01-18 DIAGNOSIS — N6489 Other specified disorders of breast: Secondary | ICD-10-CM

## 2019-01-18 DIAGNOSIS — R195 Other fecal abnormalities: Secondary | ICD-10-CM

## 2019-01-18 DIAGNOSIS — J309 Allergic rhinitis, unspecified: Secondary | ICD-10-CM | POA: Diagnosis not present

## 2019-01-18 DIAGNOSIS — F419 Anxiety disorder, unspecified: Secondary | ICD-10-CM | POA: Diagnosis not present

## 2019-01-18 DIAGNOSIS — I10 Essential (primary) hypertension: Secondary | ICD-10-CM | POA: Diagnosis not present

## 2019-01-18 DIAGNOSIS — F32A Depression, unspecified: Secondary | ICD-10-CM

## 2019-01-18 MED ORDER — MONTELUKAST SODIUM 10 MG PO TABS: 10.0000 mg | ORAL_TABLET | Freq: Every day | ORAL | 3 refills | Status: DC

## 2019-01-18 NOTE — Patient Instructions (Signed)
Bland Diet (Bananas, Broth, Rice, Applesauce, Toast, crackers, low sugar Ginger ale or Gatorade)  Peptobismol chewable tablets as needed  Reduce sugary foods   Let me know if diarrhea/loose stools do not improve in 2 weeks   A bland diet consists of foods that are often soft and do not have a lot of fat, fiber, or extra seasonings. Foods without fat, fiber, or seasoning are easier for the body to digest. They are also less likely to irritate your mouth, throat, stomach, and other parts of your digestive system. A bland diet is sometimes called a BRAT diet. What is my plan? Your health care provider or food and nutrition specialist (dietitian) may recommend specific changes to your diet to prevent symptoms or to treat your symptoms. These changes may include:  Eating small meals often.  Cooking food until it is soft enough to chew easily.  Chewing your food well.  Drinking fluids slowly.  Not eating foods that are very spicy, sour, or fatty.  Not eating citrus fruits, such as oranges and grapefruit. What do I need to know about this diet?  Eat a variety of foods from the bland diet food list.  Do not follow a bland diet longer than needed.  Ask your health care provider whether you should take vitamins or supplements. What foods can I eat? Grains  Hot cereals, such as cream of wheat. Rice. Bread, crackers, or tortillas made from refined white flour. Vegetables Canned or cooked vegetables. Mashed or boiled potatoes. Fruits  Bananas. Applesauce. Other types of cooked or canned fruit with the skin and seeds removed, such as canned peaches or pears. Meats and other proteins  Scrambled eggs. Creamy peanut butter or other nut butters. Lean, well-cooked meats, such as chicken or fish. Tofu. Soups or broths. Dairy Low-fat dairy products, such as milk, cottage cheese, or yogurt. Beverages  Water. Herbal tea. Apple juice. Fats and oils Mild salad dressings. Canola or olive oil.  Sweets and desserts Pudding. Custard. Fruit gelatin. Ice cream. The items listed above may not be a complete list of recommended foods and beverages. Contact a dietitian for more options. What foods are not recommended? Grains Whole grain breads and cereals. Vegetables Raw vegetables. Fruits Raw fruits, especially citrus, berries, or dried fruits. Dairy Whole fat dairy foods. Beverages Caffeinated drinks. Alcohol. Seasonings and condiments Strongly flavored seasonings or condiments. Hot sauce. Salsa. Other foods Spicy foods. Fried foods. Sour foods, such as pickled or fermented foods. Foods with high sugar content. Foods high in fiber. The items listed above may not be a complete list of foods and beverages to avoid. Contact a dietitian for more information. Summary  A bland diet consists of foods that are often soft and do not have a lot of fat, fiber, or extra seasonings.  Foods without fat, fiber, or seasoning are easier for the body to digest.  Check with your health care provider to see how long you should follow this diet plan. It is not meant to be followed for long periods. This information is not intended to replace advice given to you by your health care provider. Make sure you discuss any questions you have with your health care provider. Document Released: 01/26/2016 Document Revised: 11/02/2017 Document Reviewed: 11/02/2017 Elsevier Interactive Patient Education  2019 [employer].

## 2019-01-18 NOTE — Progress Notes (Signed)
Virtual Visit via Video Note  I connected with Valerie Morales on 01/18/19 at  8:13-8:30 AM EDT by a video enabled telemedicine application and verified that I am speaking with the correct person using two identifiers. Location patient: home Location provider:work  Persons participating in the virtual visit: patient, provider  I discussed the limitations of evaluation and management by telemedicine and the availability of in person appointments. The patient expressed understanding and agreed to proceed.   HPI: 1. HTN on norvasc 5 mg qd and hctz 25 mg qd not checking BP but no h/as so she thinks it is controlled  2. Allergies on singulair and zyrtec had to stop allergy shots b/c she was swelling and bruising  3. 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 Also she reports she is on 2 but husband on 10 re COVID 19 and everything going on which gets her anxiety up. Her supervisor is [employer]   4. Loose stools x 2 weeks with different color being pale yelllow dark brown, orange and watery having 3x per day. No one else but her having loose stools nothing tried. She reports she has been trying to drink enough water   ROS: See pertinent positives and negatives per HPI.  Past Medical History:  Diagnosis Date  . Anemia    has required 2 units PRBC  . Chicken pox   . Depression    trazadone has not helped in the past   . Headache   . History of blood transfusion 2009   [place]  . Hypertension   . MRSA (methicillin resistant Staphylococcus aureus)   . Sinusitis   . Sinusitis, chronic   . UTI (urinary tract infection)   . Vitamin D deficiency     Past Surgical History:  Procedure Laterality Date  . CESAREAN SECTION     x3 [place]  . CHOLECYSTECTOMY  1998  . 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 NASAL SINUS SURGERY      2009/2010  . uterine ablation  2010    Family History  Problem Relation Age of Onset  . Cancer Mother 69       lung cancer-small cell lung cancer   . Heart disease Mother        MI with stents  . Hypertension Mother   . Diabetes Mother   . COPD Mother   . Hypertension Father   . Hyperlipidemia Father   . Cancer Maternal Aunt        breast cancer  . Breast cancer Maternal Aunt   . Cancer Maternal Uncle        colon cancer  . Mental illness Son   . Alcohol abuse Maternal Aunt        Liver Failure  . Alcohol abuse Maternal Grandfather   . Heart disease Maternal Grandfather   . Arthritis Paternal Grandmother   . Cancer Paternal Grandmother        leukemia   . Hypertension Paternal Grandmother   . Breast cancer Paternal Grandmother   . Cancer Maternal Grandmother        brain cancer   . Heart disease Paternal Grandfather   . Hypertension Paternal Grandfather     SOCIAL HX: Education officer, museum in [place]   Current Outpatient Medications:  .  amLODipine (NORVASC) 5 MG  tablet, Take 1 tablet (5 mg total) by mouth daily., Disp: 90 tablet, Rfl: 3 .  atorvastatin (LIPITOR) 10 MG tablet, Take 1 tablet (10 mg total) by mouth daily at 6 PM., Disp: 90 tablet, Rfl: 3 .  cetirizine (ZYRTEC) 10 MG tablet, Take 10 mg by mouth daily., Disp: , Rfl:  .  Cholecalciferol 50000 units capsule, Take 1 capsule (50,000 Units total) by mouth once a week., Disp: 13 capsule, Rfl: 1 .  clindamycin (CLEOCIN) 150 MG capsule, TAKE 1 CAPSULE BY MOUTH THREE TIMES DAILY UNTIL GONE, Disp: , Rfl: 0 .  diphenhydrAMINE (BENADRYL) 25 MG tablet, Take 25 mg by mouth daily as needed., Disp: , Rfl:  .  hydrochlorothiazide (HYDRODIURIL) 25 MG tablet, Take 1 tablet (25 mg total) by mouth daily., Disp: 90 tablet, Rfl: 3 .  montelukast (SINGULAIR) 10 MG tablet, Take 1 tablet (10 mg total) by mouth at bedtime., Disp: 30 tablet, Rfl: 3 .  Multiple Vitamins-Minerals (HAIR/SKIN/NAILS PO), Take 1 tablet by mouth daily., Disp: , Rfl:   .  venlafaxine XR (EFFEXOR XR) 37.5 MG 24 hr capsule, Take 1 capsule (37.5 mg total) by mouth daily with breakfast., Disp: 90 capsule, Rfl: 3  EXAM:  VITALS per patient if applicable:  GENERAL: alert, oriented, appears well and in no acute distress  HEENT: atraumatic, conjunttiva clear, no obvious abnormalities on inspection of external nose and ears  NECK: normal movements of the head and neck  LUNGS: on inspection no signs of respiratory distress, breathing rate appears normal, no obvious gross SOB, gasping or wheezing  CV: no obvious cyanosis  MS: moves all visible extremities without noticeable abnormality  PSYCH/NEURO: pleasant and cooperative, no obvious depression or anxiety, speech and thought processing grossly intact  ASSESSMENT AND PLAN:  Discussed the following assessment and plan:  1. Essential hypertension-cont meds norvasc 5 and hctz 25 mg qd  Due for fasting labs in future   2. Allergic rhinitis, unspecified seasonality, unspecified trigger-cont meds hold allergy shots for now continue singulair and zyrtec   3. Anxiety and depression-cont effexor xr 37.5 mg qd   4. Loose stools -BRAT diet, otc peptobismol if not better my chart or call back and continue stool testing   5. HM Declinedflu shot  Tdaphad Declines hep B status checkand MMR check Declines STD check    Pap had 09/07/17 Dr. Marcelline Mates get record mammo abnormal 01/2018 repeat dx mammo and [place] left, had 07/19/18 repeat ordered 01/18/2019 sch today at 3:30 pm  rec weight loss    I discussed the assessment and treatment plan with the patient. The patient was provided an opportunity to ask questions and all were answered. The patient agreed with the plan and demonstrated an understanding of the instructions.   The patient was advised to call back or seek an in-person evaluation if the symptoms worsen or if the condition fails to improve as anticipated.  I provided 17 minutes of non-face-to-face time  during this encounter.   Nino Glow McLean-Scocuzza, MD

## 2019-01-19 ENCOUNTER — Telehealth: Payer: Self-pay | Admitting: Internal Medicine

## 2019-01-19 NOTE — Telephone Encounter (Signed)
Pt returning call for lab results.  Please call pt back at work number: [phone]  Copied from [place] [id number]. Topic: Quick Communication - Lab Results (Clinic Use ONLY) >> Jan 19, 2019  8:40 AM Babs Bertin, CMA wrote: Called patient to inform them of [id number] lab results. When patient returns call, triage nurse may disclose results.

## 2019-01-19 NOTE — Telephone Encounter (Signed)
Opened  By mistake.

## 2019-01-19 NOTE — Telephone Encounter (Signed)
I called pt and gave her her mammogram results from Dr. Terese Door dated 01/19/2019 at 8:09 AM.  No questions.   She was glad for the good report.

## 2019-01-24 ENCOUNTER — Telehealth: Payer: Self-pay | Admitting: Internal Medicine

## 2019-01-24 NOTE — Telephone Encounter (Signed)
sch f/u 07/2019   [place]

## 2019-02-06 NOTE — Telephone Encounter (Signed)
Pt has been scheduled. Thank you.

## 2019-02-09 ENCOUNTER — Telehealth: Payer: Self-pay | Admitting: Internal Medicine

## 2019-02-09 ENCOUNTER — Encounter: Payer: Self-pay | Admitting: Internal Medicine

## 2019-02-09 ENCOUNTER — Other Ambulatory Visit: Payer: Self-pay | Admitting: Internal Medicine

## 2019-02-09 DIAGNOSIS — F32A Depression, unspecified: Secondary | ICD-10-CM

## 2019-02-09 DIAGNOSIS — F419 Anxiety disorder, unspecified: Secondary | ICD-10-CM

## 2019-02-09 DIAGNOSIS — F329 Major depressive disorder, single episode, unspecified: Secondary | ICD-10-CM

## 2019-02-09 MED ORDER — VENLAFAXINE HCL ER 75 MG PO CP24: 75.0000 mg | ORAL_CAPSULE | Freq: Every day | ORAL | 3 refills | Status: DC

## 2019-02-09 MED ORDER — LORAZEPAM 0.5 MG PO TABS: 0.5000 mg | ORAL_TABLET | Freq: Every day | ORAL | 1 refills | Status: DC | PRN

## 2019-02-09 NOTE — Telephone Encounter (Signed)
Copied from [place] [id number]. Topic: Quick Communication - See Telephone Encounter >> Feb 09, 2019  2:29 PM Robina Ade, Helene Kelp D wrote: CRM for notification. See Telephone encounter for: 02/09/19. Patient is calling to know if pcp can Increase her anxiety medication venlafaxine XR (EFFEXOR XR) 37.5 MG 24 hr capsule or change it to something stronger.

## 2019-02-09 NOTE — Telephone Encounter (Signed)
Sent my chart increased Effexor 75 mg qd XR and ativan 0.5 daily prn for temp increase anxiety   TMS

## 2019-03-27 ENCOUNTER — Encounter: Payer: Self-pay | Admitting: Internal Medicine

## 2019-03-28 ENCOUNTER — Other Ambulatory Visit: Payer: Self-pay | Admitting: Internal Medicine

## 2019-03-28 DIAGNOSIS — R194 Change in bowel habit: Secondary | ICD-10-CM

## 2019-03-28 DIAGNOSIS — R195 Other fecal abnormalities: Secondary | ICD-10-CM

## 2019-04-18 ENCOUNTER — Ambulatory Visit (INDEPENDENT_AMBULATORY_CARE_PROVIDER_SITE_OTHER): Payer: 59 | Admitting: Gastroenterology

## 2019-04-18 ENCOUNTER — Encounter: Payer: Self-pay | Admitting: Gastroenterology

## 2019-04-18 ENCOUNTER — Other Ambulatory Visit: Payer: Self-pay

## 2019-04-18 DIAGNOSIS — R197 Diarrhea, unspecified: Secondary | ICD-10-CM

## 2019-04-18 DIAGNOSIS — R194 Change in bowel habit: Secondary | ICD-10-CM | POA: Diagnosis not present

## 2019-04-18 DIAGNOSIS — R140 Abdominal distension (gaseous): Secondary | ICD-10-CM | POA: Diagnosis not present

## 2019-04-18 NOTE — Progress Notes (Signed)
Valerie Lame, MD [address]  [place]  [place], [place] [address]  Main: [phone]  Fax: [phone]    Gastroenterology [place] Visit  Referring Provider:     McLean-Scocuzza, Valerie Mackie * Primary Care Physician:  Morales, Valerie Glow, MD Primary Gastroenterologist:  Dr.Shavana Calder Allen Norris Reason for Consultation:     Change in bowel habits        HPI:    Virtual Visit via Video Note Location of the patient: Work Location of provider: Office  Participating persons: The patient myself and Valerie Morales.  I connected with Valerie Morales on 04/18/19 at  9:00 AM EDT by a video enabled telemedicine application and verified that I am speaking with the correct person using two identifiers.   I discussed the limitations of evaluation and management by telemedicine and the availability of in person appointments. The patient expressed understanding and agreed to proceed.  Verbal consent to proceed obtained.  History of Present Illness: Valerie Morales is a 43 y.o. female referred by Dr. Terese Door, Valerie Glow, MD  for consultation & management of change in bowel habits.  The patient reports that the last few months she has had a change in bowel habits with worsening diarrhea.  The patient reports the diarrhea is not watery but loose.  She has not been able to associate with anything she eats or drinks.  The patient does report that she had a gastric bypass surgery in the past.  There is no report of any unexplained weight loss and in fact she states that over the last few years she has been gaining weight.  She denies eating a lot of dairy products.  The patient denies any family history of colon cancer or colon polyps.  The patient denies any black stools or bloody stools.  Prior to having the diarrhea she reports that her stools were normal.  The diarrhea has woken her up from sleep in the past.  She denies this happening on a regular basis.  The patient also reports  having bloating associated with diarrhea.  Past Medical History:  Diagnosis Date  . Anemia    has required 2 units PRBC  . Chicken pox   . Depression    trazadone has not helped in the past   . Headache   . History of blood transfusion 2009   [place]  . Hypertension   . MRSA (methicillin resistant Staphylococcus aureus)   . Sinusitis   . Sinusitis, chronic   . UTI (urinary tract infection)   . Vitamin D deficiency     Past Surgical History:  Procedure Laterality Date  . CESAREAN SECTION     x3 [place]  . CHOLECYSTECTOMY  1998  . 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 NASAL SINUS SURGERY     2009/2010  . uterine ablation  2010    Prior to Admission medications   Medication Sig Start Date End Date Taking? Authorizing Provider  amLODipine (NORVASC) 5 MG tablet Take 1 tablet (5 mg total) by mouth daily. 05/18/18   Morales, Valerie Glow, MD  atorvastatin (LIPITOR) 10 MG tablet Take 1 tablet (10 mg total) by mouth daily at 6 PM. 04/11/18   Morales, Valerie Glow, MD  cetirizine (ZYRTEC) 10 MG tablet Take 10 mg by mouth daily.    [provider]  Cholecalciferol 50000 units capsule Take 1 capsule (50,000 Units total) by mouth once a week. 12/23/17  Morales, Valerie Glow, MD  diphenhydrAMINE (BENADRYL) 25 MG tablet Take 25 mg by mouth daily as needed.    [provider]  hydrochlorothiazide (HYDRODIURIL) 25 MG tablet Take 1 tablet (25 mg total) by mouth daily. 04/11/18   Morales, Valerie Glow, MD  LORazepam (ATIVAN) 0.5 MG tablet Take 1 tablet (0.5 mg total) by mouth daily as needed for anxiety. 02/09/19   Morales, Valerie Glow, MD  montelukast (SINGULAIR) 10 MG tablet Take 1 tablet (10 mg total) by mouth at bedtime. 01/18/19   Morales, Valerie Glow, MD  Multiple Vitamins-Minerals (HAIR/SKIN/NAILS PO) Take 1 tablet by mouth daily.    [provider]  venlafaxine XR (EFFEXOR-XR) 75 MG 24 hr capsule Take 1 capsule  (75 mg total) by mouth daily with breakfast. 02/09/19   Morales, Valerie Glow, MD    Family History  Problem Relation Age of Onset  . Cancer Mother 9       lung cancer-small cell lung cancer   . Heart disease Mother        MI with stents  . Hypertension Mother   . Diabetes Mother   . COPD Mother   . Hypertension Father   . Hyperlipidemia Father   . Cancer Maternal Aunt        breast cancer  . Breast cancer Maternal Aunt   . Cancer Maternal Uncle        colon cancer  . Mental illness Son   . Alcohol abuse Maternal Aunt        Liver Failure  . Alcohol abuse Maternal Grandfather   . Heart disease Maternal Grandfather   . Arthritis Paternal Grandmother   . Cancer Paternal Grandmother        leukemia   . Hypertension Paternal Grandmother   . Breast cancer Paternal Grandmother   . Cancer Maternal Grandmother        brain cancer   . Heart disease Paternal Grandfather   . Hypertension Paternal Grandfather      Social History   Tobacco Use  . Smoking status: Never Smoker  . Smokeless tobacco: Never Used  Substance Use Topics  . Alcohol use: Yes    Comment: 1 drink weekly  . Drug use: No    Allergies as of 04/18/2019 - Review Complete 08/11/2018  Allergen Reaction Noted  . Aspirin Itching 02/21/2012  . Other  12/18/2017  . Penicillins Itching 12/21/2013  . Percocet [oxycodone-acetaminophen] Hives 02/21/2012  . Latex Rash 02/21/2012    Review of Systems:    All systems reviewed and negative except where noted in HPI.   Observations/Objective:  Labs: CBC    Component Value Date/Time   WBC 7.1 12/16/2017 0942   WBC 7.7 12/21/2013 0924   RBC 4.50 12/16/2017 0942   RBC 4.39 12/21/2013 0924   HGB 13.1 12/16/2017 0942   HCT 38.7 12/16/2017 0942   PLT 321 12/16/2017 0942   MCV 86 12/16/2017 0942   MCV 88 07/06/2013 1029   MCH 29.1 12/16/2017 0942   MCH 29.8 07/06/2013 1029   MCH 28.9 02/08/2011 2350   MCHC 33.9 12/16/2017 0942   MCHC 33.0 12/21/2013 0924    RDW 15.6 (H) 12/16/2017 0942   RDW 14.8 (H) 07/06/2013 1029   LYMPHSABS 2.4 12/16/2017 0942   MONOABS 0.4 12/21/2013 0924   EOSABS 0.2 12/16/2017 0942   BASOSABS 0.0 12/16/2017 0942   CMP     Component Value Date/Time   NA 137 04/11/2018 0851   NA 141 12/16/2017 0942  NA 136 07/06/2013 1029   K 3.3 (L) 04/11/2018 0851   K 4.5 07/06/2013 1029   CL 97 04/11/2018 0851   CL 107 07/06/2013 1029   CO2 29 04/11/2018 0851   CO2 22 07/06/2013 1029   GLUCOSE 101 (H) 04/11/2018 0851   GLUCOSE 83 07/06/2013 1029   BUN 9 04/11/2018 0851   BUN 8 12/16/2017 0942   BUN 8 07/06/2013 1029   CREATININE 0.68 04/11/2018 0851   CREATININE 0.39 (L) 07/06/2013 1029   CALCIUM 9.2 04/11/2018 0851   CALCIUM 8.7 07/06/2013 1029   PROT 7.5 12/16/2017 0942   PROT 7.2 07/06/2013 1029   ALBUMIN 4.5 12/16/2017 0942   ALBUMIN 3.7 07/06/2013 1029   AST 21 12/16/2017 0942   AST 37 07/06/2013 1029   ALT 21 12/16/2017 0942   ALT 20 07/06/2013 1029   ALKPHOS 84 12/16/2017 0942   ALKPHOS 101 07/06/2013 1029   BILITOT 0.2 12/16/2017 0942   BILITOT 0.4 07/06/2013 1029   GFRNONAA 108 12/16/2017 0942   GFRNONAA >60 07/06/2013 1029   GFRAA 125 12/16/2017 0942   GFRAA >60 07/06/2013 1029    Imaging Studies: No results found.  Assessment and Plan:   Valerie Morales is a 43 y.o. y/o female has been referred for change in bowel habits.  The patient reports that this has been going on for last few months and she has had loose bowel movements without overt diarrhea.  There is no report of any foods that make it better or worse.  She also has a history of gastric bypass.  The patient will be set up for an EGD and colonoscopy to rule out any luminal cause of her diarrhea.  The patient will also be set up for a bacterial overgrowth breath test to make sure that the patient does not have bacterial overgrowth status post gastric bypass surgery.  The patient has been explained the plan and agrees with it.   Follow Up Instructions:  I discussed the assessment and treatment plan with the patient. The patient was provided an opportunity to ask questions and all were answered. The patient agreed with the plan and demonstrated an understanding of the instructions.   The patient was advised to call back or seek an in-person evaluation if the symptoms worsen or if the condition fails to improve as anticipated.  I provided 20 minutes of non-face-to-face time during this encounter.   Valerie Lame, MD  Speech recognition software was used to dictate the above note.

## 2019-04-18 NOTE — H&P (View-Only) (Signed)
Valerie Lame, MD [address]  [place]  [place], [place] [address]  Main: [phone]  Fax: [phone]    Gastroenterology [place] Visit  Referring Provider:     McLean-Scocuzza, Olivia Mackie * Primary Care Physician:  McLean-Scocuzza, Nino Glow, MD Primary Gastroenterologist:  Dr.Christinea Brizuela Allen Norris Reason for Consultation:     Change in bowel habits        HPI:    Virtual Visit via Video Note Location of the patient: Work Location of provider: Office  Participating persons: The patient myself and Valerie Morales.  I connected with Valerie Morales on 04/18/19 at  9:00 AM EDT by a video enabled telemedicine application and verified that I am speaking with the correct person using two identifiers.   I discussed the limitations of evaluation and management by telemedicine and the availability of in person appointments. The patient expressed understanding and agreed to proceed.  Verbal consent to proceed obtained.  History of Present Illness: Valerie Morales is a 43 y.o. female referred by Dr. Terese Door, Nino Glow, MD  for consultation & management of change in bowel habits.  The patient reports that the last few months she has had a change in bowel habits with worsening diarrhea.  The patient reports the diarrhea is not watery but loose.  She has not been able to associate with anything she eats or drinks.  The patient does report that she had a gastric bypass surgery in the past.  There is no report of any unexplained weight loss and in fact she states that over the last few years she has been gaining weight.  She denies eating a lot of dairy products.  The patient denies any family history of colon cancer or colon polyps.  The patient denies any black stools or bloody stools.  Prior to having the diarrhea she reports that her stools were normal.  The diarrhea has woken her up from sleep in the past.  She denies this happening on a regular basis.  The patient also reports  having bloating associated with diarrhea.  Past Medical History:  Diagnosis Date  . Anemia    has required 2 units PRBC  . Chicken pox   . Depression    trazadone has not helped in the past   . Headache   . History of blood transfusion 2009   [place]  . Hypertension   . MRSA (methicillin resistant Staphylococcus aureus)   . Sinusitis   . Sinusitis, chronic   . UTI (urinary tract infection)   . Vitamin D deficiency     Past Surgical History:  Procedure Laterality Date  . CESAREAN SECTION     x3 [place]  . CHOLECYSTECTOMY  1998  . 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 NASAL SINUS SURGERY     2009/2010  . uterine ablation  2010    Prior to Admission medications   Medication Sig Start Date End Date Taking? Authorizing Provider  amLODipine (NORVASC) 5 MG tablet Take 1 tablet (5 mg total) by mouth daily. 05/18/18   McLean-Scocuzza, Nino Glow, MD  atorvastatin (LIPITOR) 10 MG tablet Take 1 tablet (10 mg total) by mouth daily at 6 PM. 04/11/18   McLean-Scocuzza, Nino Glow, MD  cetirizine (ZYRTEC) 10 MG tablet Take 10 mg by mouth daily.    [provider]  Cholecalciferol 50000 units capsule Take 1 capsule (50,000 Units total) by mouth once a week. 12/23/17  McLean-Scocuzza, Nino Glow, MD  diphenhydrAMINE (BENADRYL) 25 MG tablet Take 25 mg by mouth daily as needed.    [provider]  hydrochlorothiazide (HYDRODIURIL) 25 MG tablet Take 1 tablet (25 mg total) by mouth daily. 04/11/18   McLean-Scocuzza, Nino Glow, MD  LORazepam (ATIVAN) 0.5 MG tablet Take 1 tablet (0.5 mg total) by mouth daily as needed for anxiety. 02/09/19   McLean-Scocuzza, Nino Glow, MD  montelukast (SINGULAIR) 10 MG tablet Take 1 tablet (10 mg total) by mouth at bedtime. 01/18/19   McLean-Scocuzza, Nino Glow, MD  Multiple Vitamins-Minerals (HAIR/SKIN/NAILS PO) Take 1 tablet by mouth daily.    [provider]  venlafaxine XR (EFFEXOR-XR) 75 MG 24 hr capsule Take 1 capsule  (75 mg total) by mouth daily with breakfast. 02/09/19   McLean-Scocuzza, Nino Glow, MD    Family History  Problem Relation Age of Onset  . Cancer Mother 79       lung cancer-small cell lung cancer   . Heart disease Mother        MI with stents  . Hypertension Mother   . Diabetes Mother   . COPD Mother   . Hypertension Father   . Hyperlipidemia Father   . Cancer Maternal Aunt        breast cancer  . Breast cancer Maternal Aunt   . Cancer Maternal Uncle        colon cancer  . Mental illness Son   . Alcohol abuse Maternal Aunt        Liver Failure  . Alcohol abuse Maternal Grandfather   . Heart disease Maternal Grandfather   . Arthritis Paternal Grandmother   . Cancer Paternal Grandmother        leukemia   . Hypertension Paternal Grandmother   . Breast cancer Paternal Grandmother   . Cancer Maternal Grandmother        brain cancer   . Heart disease Paternal Grandfather   . Hypertension Paternal Grandfather      Social History   Tobacco Use  . Smoking status: Never Smoker  . Smokeless tobacco: Never Used  Substance Use Topics  . Alcohol use: Yes    Comment: 1 drink weekly  . Drug use: No    Allergies as of 04/18/2019 - Review Complete 08/11/2018  Allergen Reaction Noted  . Aspirin Itching 02/21/2012  . Other  12/18/2017  . Penicillins Itching 12/21/2013  . Percocet [oxycodone-acetaminophen] Hives 02/21/2012  . Latex Rash 02/21/2012    Review of Systems:    All systems reviewed and negative except where noted in HPI.   Observations/Objective:  Labs: CBC    Component Value Date/Time   WBC 7.1 12/16/2017 0942   WBC 7.7 12/21/2013 0924   RBC 4.50 12/16/2017 0942   RBC 4.39 12/21/2013 0924   HGB 13.1 12/16/2017 0942   HCT 38.7 12/16/2017 0942   PLT 321 12/16/2017 0942   MCV 86 12/16/2017 0942   MCV 88 07/06/2013 1029   MCH 29.1 12/16/2017 0942   MCH 29.8 07/06/2013 1029   MCH 28.9 02/08/2011 2350   MCHC 33.9 12/16/2017 0942   MCHC 33.0 12/21/2013 0924    RDW 15.6 (H) 12/16/2017 0942   RDW 14.8 (H) 07/06/2013 1029   LYMPHSABS 2.4 12/16/2017 0942   MONOABS 0.4 12/21/2013 0924   EOSABS 0.2 12/16/2017 0942   BASOSABS 0.0 12/16/2017 0942   CMP     Component Value Date/Time   NA 137 04/11/2018 0851   NA 141 12/16/2017 0942  NA 136 07/06/2013 1029   K 3.3 (L) 04/11/2018 0851   K 4.5 07/06/2013 1029   CL 97 04/11/2018 0851   CL 107 07/06/2013 1029   CO2 29 04/11/2018 0851   CO2 22 07/06/2013 1029   GLUCOSE 101 (H) 04/11/2018 0851   GLUCOSE 83 07/06/2013 1029   BUN 9 04/11/2018 0851   BUN 8 12/16/2017 0942   BUN 8 07/06/2013 1029   CREATININE 0.68 04/11/2018 0851   CREATININE 0.39 (L) 07/06/2013 1029   CALCIUM 9.2 04/11/2018 0851   CALCIUM 8.7 07/06/2013 1029   PROT 7.5 12/16/2017 0942   PROT 7.2 07/06/2013 1029   ALBUMIN 4.5 12/16/2017 0942   ALBUMIN 3.7 07/06/2013 1029   AST 21 12/16/2017 0942   AST 37 07/06/2013 1029   ALT 21 12/16/2017 0942   ALT 20 07/06/2013 1029   ALKPHOS 84 12/16/2017 0942   ALKPHOS 101 07/06/2013 1029   BILITOT 0.2 12/16/2017 0942   BILITOT 0.4 07/06/2013 1029   GFRNONAA 108 12/16/2017 0942   GFRNONAA >60 07/06/2013 1029   GFRAA 125 12/16/2017 0942   GFRAA >60 07/06/2013 1029    Imaging Studies: No results found.  Assessment and Plan:   PATRICK SOHM is a 43 y.o. y/o female has been referred for change in bowel habits.  The patient reports that this has been going on for last few months and she has had loose bowel movements without overt diarrhea.  There is no report of any foods that make it better or worse.  She also has a history of gastric bypass.  The patient will be set up for an EGD and colonoscopy to rule out any luminal cause of her diarrhea.  The patient will also be set up for a bacterial overgrowth breath test to make sure that the patient does not have bacterial overgrowth status post gastric bypass surgery.  The patient has been explained the plan and agrees with it.   Follow Up Instructions:  I discussed the assessment and treatment plan with the patient. The patient was provided an opportunity to ask questions and all were answered. The patient agreed with the plan and demonstrated an understanding of the instructions.   The patient was advised to call back or seek an in-person evaluation if the symptoms worsen or if the condition fails to improve as anticipated.  I provided 20 minutes of non-face-to-face time during this encounter.   Valerie Lame, MD  Speech recognition software was used to dictate the above note.

## 2019-04-19 ENCOUNTER — Telehealth: Payer: Self-pay | Admitting: Gastroenterology

## 2019-04-19 NOTE — Telephone Encounter (Signed)
LVM for pt to return my call.

## 2019-04-19 NOTE — Telephone Encounter (Signed)
Pt left vm she states she had a virtual visit with Dr. Allen Norris yesterday and was told to get scheduled for Endoscopy and colonoscopy please call pt

## 2019-04-23 ENCOUNTER — Other Ambulatory Visit: Payer: Self-pay

## 2019-04-23 DIAGNOSIS — R194 Change in bowel habit: Secondary | ICD-10-CM

## 2019-04-23 DIAGNOSIS — R197 Diarrhea, unspecified: Secondary | ICD-10-CM

## 2019-04-23 DIAGNOSIS — R140 Abdominal distension (gaseous): Secondary | ICD-10-CM

## 2019-04-23 NOTE — Telephone Encounter (Signed)
Pt returned call and has been scheduled for a colonoscopy and EGD on 05/01/19.

## 2019-04-23 NOTE — Telephone Encounter (Signed)
Left vm for pt to return my call.  

## 2019-04-26 ENCOUNTER — Telehealth: Payer: Self-pay | Admitting: Gastroenterology

## 2019-04-26 ENCOUNTER — Other Ambulatory Visit: Payer: Self-pay

## 2019-04-26 MED ORDER — SUPREP BOWEL PREP KIT 17.5-3.13-1.6 GM/177ML PO SOLN: 1.0000 | ORAL | 0 refills | Status: DC

## 2019-04-26 NOTE — Telephone Encounter (Signed)
Patient called & needs prpe for colonoscopy on Tuesday with Dr Allen Norris called into [address].

## 2019-04-27 ENCOUNTER — Other Ambulatory Visit: Payer: Self-pay

## 2019-04-27 ENCOUNTER — Other Ambulatory Visit
Admission: RE | Admit: 2019-04-27 | Discharge: 2019-04-27 | Disposition: A | Discharge status: Home / Self Care | Payer: 59 | Source: Ambulatory Visit | Attending: Gastroenterology | Admitting: Gastroenterology

## 2019-04-27 DIAGNOSIS — Z1159 Encounter for screening for other viral diseases: Secondary | ICD-10-CM | POA: Diagnosis not present

## 2019-04-27 DIAGNOSIS — Z01812 Encounter for preprocedural laboratory examination: Secondary | ICD-10-CM | POA: Diagnosis not present

## 2019-04-27 LAB — SARS CORONAVIRUS 2 (TAT 6-24 HRS): SARS Coronavirus 2: NEGATIVE

## 2019-04-30 NOTE — Telephone Encounter (Signed)
Pt notified bowel prep has been sent to her pharmacy.

## 2019-05-01 ENCOUNTER — Encounter: Payer: Self-pay | Admitting: Anesthesiology

## 2019-05-01 ENCOUNTER — Encounter
Admission: RE | Disposition: A | Discharge status: Home / Self Care | Payer: Self-pay | Source: Home / Self Care | Attending: Gastroenterology

## 2019-05-01 ENCOUNTER — Other Ambulatory Visit: Payer: Self-pay

## 2019-05-01 ENCOUNTER — Ambulatory Visit: Payer: 59 | Admitting: Anesthesiology

## 2019-05-01 ENCOUNTER — Ambulatory Visit
Admission: RE | Admit: 2019-05-01 | Discharge: 2019-05-01 | Disposition: A | Discharge status: Home / Self Care | Payer: 59 | Attending: Gastroenterology | Admitting: Gastroenterology

## 2019-05-01 DIAGNOSIS — R197 Diarrhea, unspecified: Secondary | ICD-10-CM

## 2019-05-01 DIAGNOSIS — Z886 Allergy status to analgesic agent status: Secondary | ICD-10-CM | POA: Insufficient documentation

## 2019-05-01 DIAGNOSIS — Z91040 Latex allergy status: Secondary | ICD-10-CM | POA: Insufficient documentation

## 2019-05-01 DIAGNOSIS — K449 Diaphragmatic hernia without obstruction or gangrene: Secondary | ICD-10-CM | POA: Insufficient documentation

## 2019-05-01 DIAGNOSIS — I10 Essential (primary) hypertension: Secondary | ICD-10-CM | POA: Insufficient documentation

## 2019-05-01 DIAGNOSIS — Z9884 Bariatric surgery status: Secondary | ICD-10-CM | POA: Diagnosis not present

## 2019-05-01 DIAGNOSIS — R194 Change in bowel habit: Secondary | ICD-10-CM | POA: Diagnosis present

## 2019-05-01 DIAGNOSIS — R140 Abdominal distension (gaseous): Secondary | ICD-10-CM

## 2019-05-01 DIAGNOSIS — F329 Major depressive disorder, single episode, unspecified: Secondary | ICD-10-CM | POA: Diagnosis not present

## 2019-05-01 DIAGNOSIS — K529 Noninfective gastroenteritis and colitis, unspecified: Secondary | ICD-10-CM | POA: Diagnosis not present

## 2019-05-01 DIAGNOSIS — Z79899 Other long term (current) drug therapy: Secondary | ICD-10-CM | POA: Diagnosis not present

## 2019-05-01 DIAGNOSIS — Z880 Allergy status to penicillin: Secondary | ICD-10-CM | POA: Insufficient documentation

## 2019-05-01 DIAGNOSIS — R1013 Epigastric pain: Secondary | ICD-10-CM

## 2019-05-01 DIAGNOSIS — Z8249 Family history of ischemic heart disease and other diseases of the circulatory system: Secondary | ICD-10-CM | POA: Insufficient documentation

## 2019-05-01 DIAGNOSIS — Z885 Allergy status to narcotic agent status: Secondary | ICD-10-CM | POA: Diagnosis not present

## 2019-05-01 DIAGNOSIS — Z9049 Acquired absence of other specified parts of digestive tract: Secondary | ICD-10-CM | POA: Insufficient documentation

## 2019-05-01 HISTORY — PX: COLONOSCOPY WITH PROPOFOL: SHX5780

## 2019-05-01 HISTORY — PX: ESOPHAGOGASTRODUODENOSCOPY (EGD) WITH PROPOFOL: SHX5813

## 2019-05-01 LAB — POCT PREGNANCY, URINE: Preg Test, Ur: NEGATIVE

## 2019-05-01 SURGERY — COLONOSCOPY WITH PROPOFOL
Anesthesia: General

## 2019-05-01 MED ORDER — LIDOCAINE HCL (PF) 2 % IJ SOLN
INTRAMUSCULAR | Status: AC
  Filled 2019-05-01: qty 10

## 2019-05-01 MED ORDER — PHENYLEPHRINE HCL (PRESSORS) 10 MG/ML IV SOLN
INTRAVENOUS | Status: DC | PRN
  Administered 2019-05-01: 100 ug via INTRAVENOUS

## 2019-05-01 MED ORDER — FENTANYL CITRATE (PF) 100 MCG/2ML IJ SOLN
INTRAMUSCULAR | Status: DC | PRN
  Administered 2019-05-01 (×2): 25 ug via INTRAVENOUS

## 2019-05-01 MED ORDER — PROPOFOL 500 MG/50ML IV EMUL
INTRAVENOUS | Status: DC | PRN
  Administered 2019-05-01: 140 ug/kg/min via INTRAVENOUS

## 2019-05-01 MED ORDER — GLYCOPYRROLATE 0.2 MG/ML IJ SOLN
INTRAMUSCULAR | Status: AC
  Filled 2019-05-01: qty 1

## 2019-05-01 MED ORDER — FENTANYL CITRATE (PF) 100 MCG/2ML IJ SOLN
INTRAMUSCULAR | Status: AC
  Filled 2019-05-01: qty 2

## 2019-05-01 MED ORDER — PROPOFOL 10 MG/ML IV BOLUS
INTRAVENOUS | Status: DC | PRN
  Administered 2019-05-01: 70 mg via INTRAVENOUS

## 2019-05-01 MED ORDER — PROPOFOL 500 MG/50ML IV EMUL
INTRAVENOUS | Status: AC
  Filled 2019-05-01: qty 50

## 2019-05-01 MED ORDER — SODIUM CHLORIDE 0.9 % IV SOLN
INTRAVENOUS | Status: DC
  Administered 2019-05-01: 11:00:00 1000 mL via INTRAVENOUS

## 2019-05-01 NOTE — Op Note (Signed)
[facility] Gastroenterology Patient Name: Valerie Morales Procedure Date: 05/01/2019 11:04 AM MRN: [id number] Account #: [id number] Date of Birth: 1976/06/02 Admit Type: Outpatient Age: 43 Room: [facility] ENDO ROOM 4 Gender: Female Note Status: Finalized Procedure:            Colonoscopy Indications:          Chronic diarrhea Providers:            Lucilla Lame MD, MD Medicines:            Propofol per Anesthesia Complications:        No immediate complications. Procedure:            Pre-Anesthesia Assessment:                       - Prior to the procedure, a History and Physical was                        performed, and patient medications and allergies were                        reviewed. The patient's tolerance of previous                        anesthesia was also reviewed. The risks and benefits of                        the procedure and the sedation options and risks were                        discussed with the patient. All questions were                        answered, and informed consent was obtained. Prior                        Anticoagulants: The patient has taken no previous                        anticoagulant or antiplatelet agents. ASA Grade                        Assessment: II - A patient with mild systemic disease.                        After reviewing the risks and benefits, the patient was                        deemed in satisfactory condition to undergo the                        procedure.                       After obtaining informed consent, the colonoscope was                        passed under direct vision. Throughout the procedure,                        the patient's blood pressure, pulse, and oxygen  saturations were monitored continuously. The                        Colonoscope was introduced through the anus and                        advanced to the the terminal ileum. The colonoscopy was           performed without difficulty. The patient tolerated the                        procedure well. The quality of the bowel preparation                        was excellent. Findings:      The perianal and digital rectal examinations were normal.      The terminal ileum appeared normal. Biopsies were taken with a cold       forceps for histology.      The colon (entire examined portion) appeared normal. Biopsies were taken       with a cold forceps for histology. Random biopsies were obtained with       cold forceps for histology randomly in the entire colon. Impression:           - The examined portion of the ileum was normal.                        Biopsied.                       - The entire examined colon is normal. Biopsied.                       - Random biopsies were obtained in the entire colon. Recommendation:       - Discharge patient to home.                       - Resume previous diet.                       - Continue present medications.                       - Await pathology results. Procedure Code(s):    --- Professional ---                       [phone], Colonoscopy, flexible; with biopsy, single or                        multiple Diagnosis Code(s):    --- Professional ---                       K52.9, Noninfective gastroenteritis and colitis,                        unspecified CPT copyright 2019 American Medical Association. All rights reserved. The codes documented in this report are preliminary and upon coder review may  be revised to meet current compliance requirements. Lucilla Lame MD, MD 05/01/2019 11:36:16 AM This report has been signed electronically. Number of Addenda: 0 Note Initiated On: 05/01/2019 11:04 AM Scope Withdrawal Time: 0 hours 7  minutes 58 seconds  Total Procedure Duration: 0 hours 12 minutes 13 seconds  Estimated Blood Loss: Estimated blood loss: none.      [facility]

## 2019-05-01 NOTE — Anesthesia Post-op Follow-up Note (Signed)
Anesthesia QCDR form completed.        

## 2019-05-01 NOTE — Anesthesia Preprocedure Evaluation (Addendum)
Anesthesia Evaluation  Patient identified by MRN, date of birth, ID band Patient awake    Reviewed: Allergy & Precautions, NPO status , Patient's Chart, lab work & pertinent test results  Airway Mallampati: III       Dental   Pulmonary neg pulmonary ROS,    Pulmonary exam normal        Cardiovascular hypertension, Pt. on medications Normal cardiovascular exam     Neuro/Psych  Headaches, PSYCHIATRIC DISORDERS Anxiety Depression    GI/Hepatic Neg liver ROS,   Endo/Other  Morbid obesity  Renal/GU negative Renal ROS     Musculoskeletal negative musculoskeletal ROS (+)   Abdominal Normal abdominal exam  (+) + obese,   Peds negative pediatric ROS (+)  Hematology  (+) anemia ,   Anesthesia Other Findings   Reproductive/Obstetrics                            Anesthesia Physical Anesthesia Plan  ASA: III  Anesthesia Plan: General   Post-op Pain Management:    Induction: Intravenous  PONV Risk Score and Plan: Propofol infusion and TIVA  Airway Management Planned: Nasal Cannula  Additional Equipment:   Intra-op Plan:   Post-operative Plan:   Informed Consent: I have reviewed the patients History and Physical, chart, labs and discussed the procedure including the risks, benefits and alternatives for the proposed anesthesia with the patient or authorized representative who has indicated his/her understanding and acceptance.     Dental advisory given  Plan Discussed with: CRNA and Surgeon  Anesthesia Plan Comments:        Anesthesia Quick Evaluation

## 2019-05-01 NOTE — Interval H&P Note (Signed)
History and Physical Interval Note:  05/01/2019 10:18 AM  Valerie Morales  has presented today for surgery, with the diagnosis of Change in bowel habits R19.4 Diarrhea R19.7 bloating R14.0.  The various methods of treatment have been discussed with the patient and family. After consideration of risks, benefits and other options for treatment, the patient has consented to  Procedure(s): COLONOSCOPY WITH PROPOFOL (N/A) ESOPHAGOGASTRODUODENOSCOPY (EGD) WITH PROPOFOL (N/A) as a surgical intervention.  The patient's history has been reviewed, patient examined, no change in status, stable for surgery.  I have reviewed the patient's chart and labs.  Questions were answered to the patient's satisfaction.     Halvor Behrend [employer]

## 2019-05-01 NOTE — Op Note (Signed)
[facility] Gastroenterology Patient Name: Valerie Morales Procedure Date: 05/01/2019 11:05 AM MRN: [id number] Account #: [id number] Date of Birth: Sep 17, 1976 Admit Type: Outpatient Age: 43 Room: [facility] ENDO ROOM 4 Gender: Female Note Status: Finalized Procedure:            Upper GI endoscopy Indications:          Dyspepsia, Diarrhea Providers:            Lucilla Lame MD, MD Referring MD:         Nino Glow Mclean-Scocuzza MD, MD (Referring MD) Medicines:            Propofol per Anesthesia Complications:        No immediate complications. Procedure:            Pre-Anesthesia Assessment:                       - Prior to the procedure, a History and Physical was                        performed, and patient medications and allergies were                        reviewed. The patient's tolerance of previous                        anesthesia was also reviewed. The risks and benefits of                        the procedure and the sedation options and risks were                        discussed with the patient. All questions were                        answered, and informed consent was obtained. Prior                        Anticoagulants: The patient has taken no previous                        anticoagulant or antiplatelet agents. ASA Grade                        Assessment: II - A patient with mild systemic disease.                        After reviewing the risks and benefits, the patient was                        deemed in satisfactory condition to undergo the                        procedure.                       After obtaining informed consent, the endoscope was                        passed under direct vision. Throughout the procedure,  the patient's blood pressure, pulse, and oxygen                        saturations were monitored continuously. The Endoscope                        was introduced through the mouth, and advanced to the                         second part of duodenum. The upper GI endoscopy was                        accomplished without difficulty. The patient tolerated                        the procedure well. Findings:      A small hiatal hernia was present.      Evidence of a Roux-en-Y gastrojejunostomy was found. The gastrojejunal       anastomosis was characterized by healthy appearing mucosa. Biopsies were       taken with a cold forceps for histology.      The examined jejunum was normal. Impression:           - Small hiatal hernia.                       - Roux-en-Y gastrojejunostomy with gastrojejunal                        anastomosis characterized by healthy appearing mucosa.                        Biopsied.                       - Normal examined jejunum. Recommendation:       - Discharge patient to home.                       - Resume previous diet.                       - Continue present medications.                       - Await pathology results.                       - Perform a colonoscopy today. Procedure Code(s):    --- Professional ---                       [phone], Esophagogastroduodenoscopy, flexible, transoral;                        with biopsy, single or multiple Diagnosis Code(s):    --- Professional ---                       R10.13, Epigastric pain                       R19.7, Diarrhea, unspecified CPT copyright 2019 American Medical Association. All rights reserved. The codes documented in this report are preliminary and upon coder review may  be revised to meet current compliance requirements. Lucilla Lame MD, MD 05/01/2019 11:20:08 AM This report has been signed electronically. Number of Addenda: 0 Note Initiated On: 05/01/2019 11:05 AM Estimated Blood Loss: Estimated blood loss: none.      [facility]

## 2019-05-01 NOTE — Transfer of Care (Signed)
Immediate Anesthesia Transfer of Care Note  Patient: Valerie Morales  Procedure(s) Performed: COLONOSCOPY WITH PROPOFOL (N/A ) ESOPHAGOGASTRODUODENOSCOPY (EGD) WITH PROPOFOL (N/A )  Patient Location: PACU  Anesthesia Type:General  Level of Consciousness: sedated  Airway & Oxygen Therapy: Patient Spontanous Breathing and Patient connected to nasal cannula oxygen  Post-op Assessment: Report given to RN and Post -op Vital signs reviewed and stable  Post vital signs: Reviewed and stable  Last Vitals:  Vitals Value Taken Time  BP 122/84 05/01/19 1139  Temp 36.3 C 05/01/19 1139  Pulse 99 05/01/19 1139  Resp 24 05/01/19 1139  SpO2 99 % 05/01/19 1139  Vitals shown include unvalidated device data.  Last Pain:  Vitals:   05/01/19 1139  TempSrc:   PainSc: 0-No pain         Complications: No apparent anesthesia complications

## 2019-05-02 ENCOUNTER — Encounter: Payer: Self-pay | Admitting: Gastroenterology

## 2019-05-02 NOTE — Anesthesia Postprocedure Evaluation (Signed)
Anesthesia Post Note  Patient: Valerie Morales  Procedure(s) Performed: COLONOSCOPY WITH PROPOFOL (N/A ) ESOPHAGOGASTRODUODENOSCOPY (EGD) WITH PROPOFOL (N/A )  Patient location during evaluation: Endoscopy Anesthesia Type: General Level of consciousness: awake and alert and oriented Pain management: pain level controlled Vital Signs Assessment: post-procedure vital signs reviewed and stable Respiratory status: spontaneous breathing Cardiovascular status: blood pressure returned to baseline Anesthetic complications: no     Last Vitals:  Vitals:   05/01/19 1158 05/01/19 1208  BP: (!) 136/96 (!) 141/98  Pulse: 90 80  Resp: 17 (!) 21  Temp:    SpO2: 100% 100%    Last Pain:  Vitals:   05/01/19 1208  TempSrc:   PainSc: 0-No pain                 Mileah Hemmer

## 2019-05-03 ENCOUNTER — Encounter: Payer: Self-pay | Admitting: Gastroenterology

## 2019-05-03 ENCOUNTER — Telehealth: Payer: Self-pay

## 2019-05-03 LAB — SURGICAL PATHOLOGY

## 2019-05-03 NOTE — Telephone Encounter (Signed)
LVM for pt to return my call.

## 2019-05-03 NOTE — Telephone Encounter (Signed)
-----   Message from Lucilla Lame, MD sent at 05/03/2019  8:59 AM EDT ----- Let patient know that her biopsies of the small bowel and colon were all normal and did not show any sign of inflammation or cause for diarrhea.  The patient was supposed to have a breath test for bacterial overgrowth but for some reason states that she was not contacted to have that done.  Please make sure that is ordered thank you.

## 2019-05-03 NOTE — Telephone Encounter (Signed)
Spoke with pt and advise of procedure results. Hyrdogen breath test was emailed to pt at [email or web address]

## 2019-05-11 ENCOUNTER — Other Ambulatory Visit: Payer: Self-pay | Admitting: Internal Medicine

## 2019-05-11 DIAGNOSIS — I10 Essential (primary) hypertension: Secondary | ICD-10-CM

## 2019-05-11 MED ORDER — HYDROCHLOROTHIAZIDE 25 MG PO TABS: 25.0000 mg | ORAL_TABLET | Freq: Every day | ORAL | 3 refills | Status: DC

## 2019-05-11 MED ORDER — AMLODIPINE BESYLATE 5 MG PO TABS: 5.0000 mg | ORAL_TABLET | Freq: Every day | ORAL | 3 refills | Status: DC

## 2019-07-23 ENCOUNTER — Other Ambulatory Visit: Payer: Self-pay

## 2019-07-25 ENCOUNTER — Encounter: Payer: Self-pay | Admitting: Internal Medicine

## 2019-07-25 ENCOUNTER — Other Ambulatory Visit: Payer: Self-pay

## 2019-07-25 ENCOUNTER — Ambulatory Visit: Payer: 59 | Admitting: Internal Medicine

## 2019-07-25 VITALS — BP 112/86 | HR 94 | Temp 98.0°F | Ht 63.0 in | Wt 247.0 lb

## 2019-07-25 DIAGNOSIS — E559 Vitamin D deficiency, unspecified: Secondary | ICD-10-CM

## 2019-07-25 DIAGNOSIS — R928 Other abnormal and inconclusive findings on diagnostic imaging of breast: Secondary | ICD-10-CM

## 2019-07-25 DIAGNOSIS — R739 Hyperglycemia, unspecified: Secondary | ICD-10-CM | POA: Diagnosis not present

## 2019-07-25 DIAGNOSIS — R7303 Prediabetes: Secondary | ICD-10-CM | POA: Insufficient documentation

## 2019-07-25 DIAGNOSIS — I10 Essential (primary) hypertension: Secondary | ICD-10-CM | POA: Diagnosis not present

## 2019-07-25 DIAGNOSIS — Z0000 Encounter for general adult medical examination without abnormal findings: Secondary | ICD-10-CM | POA: Diagnosis not present

## 2019-07-25 DIAGNOSIS — E785 Hyperlipidemia, unspecified: Secondary | ICD-10-CM | POA: Diagnosis not present

## 2019-07-25 DIAGNOSIS — Z1389 Encounter for screening for other disorder: Secondary | ICD-10-CM

## 2019-07-25 DIAGNOSIS — R748 Abnormal levels of other serum enzymes: Secondary | ICD-10-CM | POA: Insufficient documentation

## 2019-07-25 DIAGNOSIS — Z1329 Encounter for screening for other suspected endocrine disorder: Secondary | ICD-10-CM

## 2019-07-25 LAB — TSH: TSH: 2.81 u[IU]/mL (ref 0.35–4.50)

## 2019-07-25 LAB — COMPREHENSIVE METABOLIC PANEL
ALT: 43 U/L — ABNORMAL HIGH (ref 0–35)
AST: 52 U/L — ABNORMAL HIGH (ref 0–37)
Albumin: 4.2 g/dL (ref 3.5–5.2)
Alkaline Phosphatase: 71 U/L (ref 39–117)
BUN: 10 mg/dL (ref 6–23)
CO2: 27 mEq/L (ref 19–32)
Calcium: 9.3 mg/dL (ref 8.4–10.5)
Chloride: 100 mEq/L (ref 96–112)
Creatinine, Ser: 0.65 mg/dL (ref 0.40–1.20)
GFR: 119.75 mL/min (ref >60.00)
Glucose, Bld: 92 mg/dL (ref 70–99)
Potassium: 3.3 mEq/L — ABNORMAL LOW (ref 3.5–5.1)
Sodium: 139 mEq/L (ref 135–145)
Total Bilirubin: 0.4 mg/dL (ref 0.2–1.2)
Total Protein: 7 g/dL (ref 6.0–8.3)

## 2019-07-25 LAB — CBC WITH DIFFERENTIAL/PLATELET
Basophils Absolute: 0.1 10*3/uL (ref 0.0–0.1)
Basophils Relative: 0.9 % (ref 0.0–3.0)
Eosinophils Absolute: 0.1 10*3/uL (ref 0.0–0.7)
Eosinophils Relative: 2.4 % (ref 0.0–5.0)
HCT: 41.4 % (ref 36.0–46.0)
Hemoglobin: 13.7 g/dL (ref 12.0–15.0)
Lymphocytes Relative: 32.9 % (ref 12.0–46.0)
Lymphs Abs: 2 10*3/uL (ref 0.7–4.0)
MCHC: 33.2 g/dL (ref 30.0–36.0)
MCV: 93.3 fl (ref 78.0–100.0)
Monocytes Absolute: 0.4 10*3/uL (ref 0.1–1.0)
Monocytes Relative: 6.6 % (ref 3.0–12.0)
Neutro Abs: 3.5 10*3/uL (ref 1.4–7.7)
Neutrophils Relative %: 57.2 % (ref 43.0–77.0)
Platelets: 220 10*3/uL (ref 150.0–400.0)
RBC: 4.43 Mil/uL (ref 3.87–5.11)
RDW: 14.6 % (ref 11.5–15.5)
WBC: 6.1 10*3/uL (ref 4.0–10.5)

## 2019-07-25 LAB — LIPID PANEL
Cholesterol: 177 mg/dL (ref 0–200)
HDL: 62.4 mg/dL (ref >39.00)
LDL Cholesterol: 81 mg/dL (ref 0–99)
NonHDL: 114.48
Total CHOL/HDL Ratio: 3
Triglycerides: 168 mg/dL — ABNORMAL HIGH (ref 0.0–149.0)
VLDL: 33.6 mg/dL (ref 0.0–40.0)

## 2019-07-25 LAB — T4, FREE: Free T4: 0.73 ng/dL (ref 0.60–1.60)

## 2019-07-25 LAB — HEMOGLOBIN A1C: Hgb A1c MFr Bld: 5.8 % (ref 4.6–6.5)

## 2019-07-25 LAB — VITAMIN D 25 HYDROXY (VIT D DEFICIENCY, FRACTURES): VITD: 35.97 ng/mL (ref 30.00–100.00)

## 2019-07-25 NOTE — Patient Instructions (Addendum)
The next 56 days online nutrition   Exercise   Protein shake as meal replacement Premier protein with bananas   Chia seeds or flaxseeds to shake makes you feel full    Intermittent fasting 12-18 hrs per day or 1 day of week fruit and water    Phentermine tablets or capsules What is this medicine? PHENTERMINE (FEN ter meen) decreases your appetite. It is used with a reduced calorie diet and exercise to help you lose weight. This medicine may be used for other purposes; ask your health care provider or pharmacist if you have questions. COMMON BRAND NAME(S): Adipex-P, Atti-Plex P, Atti-Plex P Spansule, Fastin, Lomaira, Pro-Fast, Tara-8 What should I tell my health care provider before I take this medicine? They need to know if you have any of these conditions:  agitation or nervousness  diabetes  glaucoma  heart disease  high blood pressure  history of drug abuse or addiction  history of stroke  kidney disease  lung disease called Primary Pulmonary Hypertension (PPH)  taken an MAOI like Carbex, Eldepryl, Marplan, Nardil, or Parnate in last 14 days  taking stimulant medicines for attention disorders, weight loss, or to stay awake  thyroid disease  an unusual or allergic reaction to phentermine, other medicines, foods, dyes, or preservatives  pregnant or trying to get pregnant  breast-feeding How should I use this medicine? Take this medicine by mouth with a glass of water. Follow the directions on the prescription label. The instructions for use may differ based on the product and dose you are taking. Avoid taking this medicine in the evening. It may interfere with sleep. Take your doses at regular intervals. Do not take your medicine more often than directed. Talk to your pediatrician regarding the use of this medicine in children. While this drug may be prescribed for children 17 years or older for selected conditions, precautions do apply. Overdosage: If you think  you have taken too much of this medicine contact a poison control center or emergency room at once. NOTE: This medicine is only for you. Do not share this medicine with others. What if I miss a dose? If you miss a dose, take it as soon as you can. If it is almost time for your next dose, take only that dose. Do not take double or extra doses. What may interact with this medicine? Do not take this medicine with any of the following medications:  MAOIs like Carbex, Eldepryl, Marplan, Nardil, and Parnate  medicines for colds or breathing difficulties like pseudoephedrine or phenylephrine  procarbazine  sibutramine  stimulant medicines for attention disorders, weight loss, or to stay awake This medicine may also interact with the following medications:  certain medicines for depression, anxiety, or psychotic disturbances  linezolid  medicines for diabetes  medicines for high blood pressure This list may not describe all possible interactions. Give your health care provider a list of all the medicines, herbs, non-prescription drugs, or dietary supplements you use. Also tell them if you smoke, drink alcohol, or use illegal drugs. Some items may interact with your medicine. What should I watch for while using this medicine? Notify your physician immediately if you become short of breath while doing your normal activities. Do not take this medicine within 6 hours of bedtime. It can keep you from getting to sleep. Avoid drinks that contain caffeine and try to stick to a regular bedtime every night. This medicine was intended to be used in addition to a healthy diet and exercise.  The best results are achieved this way. This medicine is only indicated for short-term use. Eventually your weight loss may level out. At that point, the drug will only help you maintain your new weight. Do not increase or in any way change your dose without consulting your doctor. You may get drowsy or dizzy. Do not  drive, use machinery, or do anything that needs mental alertness until you know how this medicine affects you. Do not stand or sit up quickly, especially if you are an older patient. This reduces the risk of dizzy or fainting spells. Alcohol may increase dizziness and drowsiness. Avoid alcoholic drinks. What side effects may I notice from receiving this medicine? Side effects that you should report to your doctor or health care professional as soon as possible:  allergic reactions like skin rash, itching or hives, swelling of the face, lips, or tongue)  anxiety  breathing problems  changes in vision  chest pain or chest tightness  depressed mood or other mood changes  hallucinations, loss of contact with reality  fast, irregular heartbeat  increased blood pressure  irritable  nervousness or restlessness  painful urination  palpitations  tremors  trouble sleeping  seizures  signs and symptoms of a stroke like changes in vision; confusion; trouble speaking or understanding; severe headaches; sudden numbness or weakness of the face, arm or leg; trouble walking; dizziness; loss of balance or coordination  unusually weak or tired  vomiting Side effects that usually do not require medical attention (report to your doctor or health care professional if they continue or are bothersome):  constipation or diarrhea  dry mouth  headache  nausea  stomach upset  sweating This list may not describe all possible side effects. Call your doctor for medical advice about side effects. You may report side effects to FDA at 1-800-FDA-1088. Where should I keep my medicine? Keep out of the reach of children. This medicine can be abused. Keep your medicine in a safe place to protect it from theft. Do not share this medicine with anyone. Selling or giving away this medicine is dangerous and against the law. This medicine may cause accidental overdose and death if taken by other adults,  children, or pets. Mix any unused medicine with a substance like cat litter or coffee grounds. Then throw the medicine away in a sealed container like a sealed bag or a coffee can with a lid. Do not use the medicine after the expiration date. Store at room temperature between 20 and 25 degrees C (68 and 77 degrees F). Keep container tightly closed. NOTE: This sheet is a summary. It may not cover all possible information. If you have questions about this medicine, talk to your doctor, pharmacist, or health care provider.  2020 Elsevier/Gold Standard (2017-03-18 08:23:13)    Exercising to Lose Weight Exercise is structured, repetitive physical activity to improve fitness and health. Getting regular exercise is important for everyone. It is especially important if you are overweight. Being overweight increases your risk of heart disease, stroke, diabetes, high blood pressure, and several types of cancer. Reducing your calorie intake and exercising can help you lose weight. Exercise is usually categorized as moderate or vigorous intensity. To lose weight, most people need to do a certain amount of moderate-intensity or vigorous-intensity exercise each week. Moderate-intensity exercise  Moderate-intensity exercise is any activity that gets you moving enough to burn at least three times more energy (calories) than if you were sitting. Examples of moderate exercise include:  Walking  a mile in 15 minutes.  Doing light yard work.  Biking at an easy pace. Most people should get at least 150 minutes (2 hours and 30 minutes) a week of moderate-intensity exercise to maintain their body weight. Vigorous-intensity exercise Vigorous-intensity exercise is any activity that gets you moving enough to burn at least six times more calories than if you were sitting. When you exercise at this intensity, you should be working hard enough that you are not able to carry on a conversation. Examples of vigorous exercise  include:  Running.  Playing a team sport, such as football, basketball, and soccer.  Jumping rope. Most people should get at least 75 minutes (1 hour and 15 minutes) a week of vigorous-intensity exercise to maintain their body weight. How can exercise affect me? When you exercise enough to burn more calories than you eat, you lose weight. Exercise also reduces body fat and builds muscle. The more muscle you have, the more calories you burn. Exercise also:  Improves mood.  Reduces stress and tension.  Improves your overall fitness, flexibility, and endurance.  Increases bone strength. The amount of exercise you need to lose weight depends on:  Your age.  The type of exercise.  Any health conditions you have.  Your overall physical ability. Talk to your health care provider about how much exercise you need and what types of activities are safe for you. What actions can I take to lose weight? Nutrition   Make changes to your diet as told by your health care provider or diet and nutrition specialist (dietitian). This may include: ? Eating fewer calories. ? Eating more protein. ? Eating less unhealthy fats. ? Eating a diet that includes fresh fruits and vegetables, whole grains, low-fat dairy products, and lean protein. ? Avoiding foods with added fat, salt, and sugar.  Drink plenty of water while you exercise to prevent dehydration or heat stroke. Activity  Choose an activity that you enjoy and set realistic goals. Your health care provider can help you make an exercise plan that works for you.  Exercise at a moderate or vigorous intensity most days of the week. ? The intensity of exercise may vary from person to person. You can tell how intense a workout is for you by paying attention to your breathing and heartbeat. Most people will notice their breathing and heartbeat get faster with more intense exercise.  Do resistance training twice each week, such as: ? Push-ups. ?  Sit-ups. ? Lifting weights. ? Using resistance bands.  Getting short amounts of exercise can be just as helpful as long structured periods of exercise. If you have trouble finding time to exercise, try to include exercise in your daily routine. ? Get up, stretch, and walk around every 30 minutes throughout the day. ? Go for a walk during your lunch break. ? Park your car farther away from your destination. ? If you take public transportation, get off one stop early and walk the rest of the way. ? Make phone calls while standing up and walking around. ? Take the stairs instead of elevators or escalators.  Wear comfortable clothes and shoes with good support.  Do not exercise so much that you hurt yourself, feel dizzy, or get very short of breath. Where to find more information  U.S. Department of Health and Human Services: BondedCompany.at  Centers for Disease Control and Prevention (CDC): http://www.wolf.info/ Contact a health care provider:  Before starting a new exercise program.  If you have questions or  concerns about your weight.  If you have a medical problem that keeps you from exercising. Get help right away if you have any of the following while exercising:  Injury.  Dizziness.  Difficulty breathing or shortness of breath that does not go away when you stop exercising.  Chest pain.  Rapid heartbeat. Summary  Being overweight increases your risk of heart disease, stroke, diabetes, high blood pressure, and several types of cancer.  Losing weight happens when you burn more calories than you eat.  Reducing the amount of calories you eat in addition to getting regular moderate or vigorous exercise each week helps you lose weight. This information is not intended to replace advice given to you by your health care provider. Make sure you discuss any questions you have with your health care provider. Document Released: 11/06/2010 Document Revised: 10/17/2017 Document Reviewed:  10/17/2017 Elsevier Patient Education  2020 [employer].

## 2019-07-25 NOTE — Progress Notes (Signed)
Chief Complaint  Patient presents with  . Follow-up   Annual doing well  1. Anxiety and depression improved on effexor rarely taking ativan husband lost job at [employer] last week and not doing well   2. BP improved on norvasc 5 mg and hctz 25 mg qd not checking at home   3. Obesity s/p wt loss surgery not exercising   4. HLD stopped lipitor 10 mg 2-3 months ago   Review of Systems  Constitutional: Negative for weight loss.  HENT: Negative for hearing loss.   Eyes: Negative for blurred vision.  Respiratory: Negative for shortness of breath.   Cardiovascular: Negative for chest pain.  Gastrointestinal: Negative for abdominal pain.  Musculoskeletal: Negative for falls.  Skin: Negative for rash.  Neurological: Negative for headaches.  Psychiatric/Behavioral: Negative for depression.   Past Medical History:  Diagnosis Date  . Anemia    has required 2 units PRBC  . Chicken pox   . Depression    trazadone has not helped in the past   . Headache   . History of blood transfusion 2009   [place]  . Hypertension   . MRSA (methicillin resistant Staphylococcus aureus)   . Sinusitis   . Sinusitis, chronic   . UTI (urinary tract infection)   . Vitamin D deficiency    Past Surgical History:  Procedure Laterality Date  . CESAREAN SECTION     x3 [place]  . CHOLECYSTECTOMY  1998  . COLONOSCOPY WITH PROPOFOL N/A 05/01/2019   Procedure: COLONOSCOPY WITH PROPOFOL;  Surgeon: Lucilla Lame, MD;  Location: [facility] ENDOSCOPY;  Service: Endoscopy;  Laterality: N/A;  . ESOPHAGOGASTRODUODENOSCOPY (EGD) WITH PROPOFOL N/A 05/01/2019   Procedure: ESOPHAGOGASTRODUODENOSCOPY (EGD) WITH PROPOFOL;  Surgeon: Lucilla Lame, MD;  Location: [facility] ENDOSCOPY;  Service: Endoscopy;  Laterality: N/A;  . 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 NASAL SINUS SURGERY     2009/2010  . uterine ablation  2010   Family History  Problem Relation Age of Onset  . Cancer Mother 101       lung  cancer-small cell lung cancer   . Heart disease Mother        MI with stents  . Hypertension Mother   . Diabetes Mother   . COPD Mother   . Hypertension Father   . Hyperlipidemia Father   . Cancer Maternal Aunt        breast cancer  . Breast cancer Maternal Aunt   . Cancer Maternal Uncle        colon cancer  . Mental illness Son   . Alcohol abuse Maternal Aunt        Liver Failure  . Alcohol abuse Maternal Grandfather   . Heart disease Maternal Grandfather   . Arthritis Paternal Grandmother   . Cancer Paternal Grandmother        leukemia   . Hypertension Paternal Grandmother   . Breast cancer Paternal Grandmother   . Cancer Maternal Grandmother        brain cancer   . Heart disease Paternal Grandfather   . Hypertension Paternal Grandfather    Social History   Socioeconomic History  . Marital status: Married    Spouse name: Not on file  . Number of children: 3  . Years of education: 40  . Highest education level: Not on file  Occupational History  . Occupation: caseworker  Social Needs  . Financial resource strain: Not on file  .  Food insecurity    Worry: Not on file    Inability: Not on file  . Transportation needs    Medical: Not on file    Non-medical: Not on file  Tobacco Use  . Smoking status: Never Smoker  . Smokeless tobacco: Never Used  Substance and Sexual Activity  . Alcohol use: Yes    Comment: 1 drink weekly  . Drug use: No  . Sexual activity: Yes    Birth control/protection: None  Lifestyle  . Physical activity    Days per week: Not on file    Minutes per session: Not on file  . Stress: Not on file  Relationships  . Social Herbalist on phone: Not on file    Gets together: Not on file    Attends religious service: Not on file    Active member of club or organization: Not on file    Attends meetings of clubs or organizations: Not on file    Relationship status: Not on file  . Intimate partner violence    Fear of current or ex  partner: Not on file    Emotionally abused: Not on file    Physically abused: Not on file    Forced sexual activity: Not on file  Other Topics Concern  . Not on file  Social History Narrative   Journei "[name]" grew up in [place], [place]. She attended [employer] and obtained her Bachelors in [employer]. She lives in [place] with her 3 children and her husband       Caffeine - 2 coffee, no sodas   Exercise - not currently   Bachelors degree    Caseworker    4 pregnancies, 3 live births    Feels safe in relationship    Wears seatbelt   No guns       Current Meds  Medication Sig  . amLODipine (NORVASC) 5 MG tablet Take 1 tablet (5 mg total) by mouth daily.  . cetirizine (ZYRTEC) 10 MG tablet Take 10 mg by mouth daily.  . Cholecalciferol 50000 units capsule Take 1 capsule (50,000 Units total) by mouth once a week.  . diphenhydrAMINE (BENADRYL) 25 MG tablet Take 25 mg by mouth daily as needed.  . hydrochlorothiazide (HYDRODIURIL) 25 MG tablet Take 1 tablet (25 mg total) by mouth daily.  [name] Kitchen LORazepam (ATIVAN) 0.5 MG tablet Take 1 tablet (0.5 mg total) by mouth daily as needed for anxiety.  . montelukast (SINGULAIR) 10 MG tablet Take 1 tablet (10 mg total) by mouth at bedtime.  . Multiple Vitamins-Minerals (HAIR/SKIN/NAILS PO) Take 1 tablet by mouth daily.  . Na Sulfate-K Sulfate-Mg Sulf (SUPREP BOWEL PREP KIT) 17.5-3.13-1.6 GM/177ML SOLN Take 1 kit by mouth as directed.  . venlafaxine XR (EFFEXOR-XR) 75 MG 24 hr capsule Take 1 capsule (75 mg total) by mouth daily with breakfast.  . [DISCONTINUED] atorvastatin (LIPITOR) 10 MG tablet Take 1 tablet (10 mg total) by mouth daily at 6 PM.   Allergies  Allergen Reactions  . Aspirin Itching    hives  . Other     ? Nut allergy   . Penicillins Itching    Hives   . Percocet [Oxycodone-Acetaminophen] Hives    Hives   . Latex Rash    Hives    Recent Results (from the past 2160 hour(s))  SARS Coronavirus 2 (Performed in [facility]  hospital lab)     Status: None   Collection Time: 04/27/19  9:49 AM   Specimen:  Nasal Swab  Result Value Ref Range   SARS Coronavirus 2 NEGATIVE NEGATIVE    Comment: (NOTE) SARS-CoV-2 target nucleic acids are NOT DETECTED. The SARS-CoV-2 RNA is generally detectable in upper and lower respiratory specimens during the acute phase of infection. Negative results do not preclude SARS-CoV-2 infection, do not rule out co-infections with other pathogens, and should not be used as the sole basis for treatment or other patient management decisions. Negative results must be combined with clinical observations, patient history, and epidemiological information. The expected result is Negative. Fact Sheet for Patients: SugarRoll.be Fact Sheet for Healthcare Providers: https://www.woods-mathews.com/ This test is not yet approved or cleared by the [place] FDA and  has been authorized for detection and/or diagnosis of SARS-CoV-2 by FDA under an Emergency Use Authorization (EUA). This EUA will remain  in effect (meaning this test can be used) for the duration of the COVID-19 declaration under Section 56 4(b)(1) of the Act, 21 U.S.C. section 360bbb-3(b)(1), unless the authorization is terminated or revoked sooner. Performed at [facility], [place] [address], [place], [place] [address]   Pregnancy, urine POC     Status: None   Collection Time: 05/01/19 10:22 AM  Result Value Ref Range   Preg Test, Ur NEGATIVE NEGATIVE    Comment:        THE SENSITIVITY OF THIS METHODOLOGY IS >24 mIU/mL   Surgical pathology     Status: None   Collection Time: 05/01/19 11:16 AM  Result Value Ref Range   SURGICAL PATHOLOGY      Surgical Pathology CASE: [id number] PATIENT: Efraim Kaufmann Surgical Pathology Report     SPECIMEN SUBMITTED: A. Duodenum, r/o celiac sprue; cbx B. Terminal ileum; cbx C. Colon, random; cbx  CLINICAL HISTORY: None  provided  PRE-OPERATIVE DIAGNOSIS: Change in bowel habits R19.4 diarrhea R19.7 bloating R14.0  POST-OPERATIVE DIAGNOSIS: Hiatal hernia, normal colonoscopy     DIAGNOSIS: A.  DUODENUM; COLD BIOPSY: - DUODENAL MUCOSA WITH INTRAMUCOSAL LYMPHOID AGGREGATE. - NEGATIVE FOR FEATURES OF CELIAC DISEASE. - NEGATIVE FOR DYSPLASIA AND MALIGNANCY.  B.  TERMINAL ILEUM; COLD BIOPSY: - UNREMARKABLE ILEAL MUCOSA. - NEGATIVE FOR INFLAMMATION, DYSPLASIA, AND MALIGNANCY.  C.  COLON, RANDOM; COLD BIOPSY: - UNREMARKABLE COLONIC MUCOSA. - NEGATIVE FOR MICROSCOPIC COLITIS, DYSPLASIA, AND MALIGNANCY.  GROSS DESCRIPTION: A. Labeled: Duodenal C BXs, rule out celiac sprue Received: Formalin Tissue fragment(s): Multiple Size: Aggregate, 0.5 x 0.5 x 0.1 cm  Description: Tan soft tissue fragments Entirely submitted in 1 cassette.  B. Labeled: Terminal ileum C BXs Received: Formalin Tissue fragment(s): Multiple Size: Aggregate, 0.5 x 0.3 x 0.1 cm Description: Tan soft tissue fragments Entirely submitted in 1 cassette.  C. Labeled: Random colon C BXs Received: Formalin Tissue fragment(s): Multiple Size: Aggregate, 1.3 x 0.2 x 0.1 cm Description: Tan soft tissue fragments Entirely submitted in 1 cassette.   Final Diagnosis performed by Betsy Pries, MD.   Electronically signed 05/03/2019 8:31:55AM The electronic signature indicates that the named Attending Pathologist has evaluated the specimen  Technical component performed at [facility], [address] [place], [place] [address] Lab: [phone] Dir: Rush Farmer, MD, MMM  Professional component performed at [facility], [facility], [place], [place], [place] [address] Lab: [phone] Dir: Dellia Nims. Rubinas, MD    Objective  Body mass index is 43.75 kg/m. Wt Readings from Last 3 Encounters:  07/25/19 247 lb (112 kg)  05/01/19 242 lb (109.8 kg)  08/11/18 256 lb 6.4 oz (116.3 kg)   Temp Readings from Last  3  Encounters:  07/25/19 98 F (36.7 C) (Oral)  05/01/19 (!) 97.3 F (36.3 C)  08/11/18 98.1 F (36.7 C) (Oral)   BP Readings from Last 3 Encounters:  07/25/19 112/86  05/01/19 (!) 141/98  08/11/18 116/78   Pulse Readings from Last 3 Encounters:  07/25/19 94  05/01/19 80  08/11/18 (!) 101    Physical Exam Vitals signs and nursing note reviewed.  Constitutional:      Appearance: Normal appearance. She is well-developed and well-groomed. She is obese.     Comments: +mask    HENT:     Head: Normocephalic and atraumatic.  Eyes:     Conjunctiva/sclera: Conjunctivae normal.     Pupils: Pupils are equal, round, and reactive to light.  Cardiovascular:     Rate and Rhythm: Normal rate and regular rhythm.     Heart sounds: Normal heart sounds. No murmur.  Pulmonary:     Effort: Pulmonary effort is normal.     Breath sounds: Normal breath sounds.  Abdominal:     General: Abdomen is flat. Bowel sounds are normal.     Tenderness: There is no abdominal tenderness.  Skin:    General: Skin is warm and dry.  Neurological:     General: No focal deficit present.     Mental Status: She is alert and oriented to person, place, and time. Mental status is at baseline.     Gait: Gait normal.  Psychiatric:        Attention and Perception: Attention and perception normal.        Mood and Affect: Mood and affect normal.        Speech: Speech normal.        Behavior: Behavior normal. Behavior is cooperative.        Thought Content: Thought content normal.        Cognition and Memory: Cognition and memory normal.        Judgment: Judgment normal.     Assessment  Plan  Annual physical exam Declinedflu shot  Tdaphad Declines hep B status checkand MMR check Declines STD check    Pap had 09/07/17 Dr. Marcelline Mates get records appt sch 08/15/19  mammo abnormal 01/2018 repeat dx mammo and [place] left, had 07/19/18 repeat ordered 01/18/2019 dx mammo repeat 01/18/20 EGD/colonoscpy had 05/01/19 [place] bx  negative    rec weight lossand exercise   Essential hypertension - Plan: Comprehensive metabolic panel, CBC with Differential/Platelet, Lipid panel -cont meds   Hyperlipidemia, unspecified hyperlipidemia type - Plan: Lipid panel Stopped lipitor 10 mg 2-3 months ago   Hyperglycemia s/p wt loss surgery - Plan: Hemoglobin A1c rec exercise and healthy diet choices    Provider: Dr. Olivia Mackie McLean-Scocuzza-Internal Medicine

## 2019-07-26 LAB — URINALYSIS, ROUTINE W REFLEX MICROSCOPIC
Bilirubin Urine: NEGATIVE
Glucose, UA: NEGATIVE
Hgb urine dipstick: NEGATIVE
Ketones, ur: NEGATIVE
Leukocytes,Ua: NEGATIVE
Nitrite: NEGATIVE
Protein, ur: NEGATIVE
Specific Gravity, Urine: 1.019 (ref 1.001–1.03)
pH: 6 (ref 5.0–8.0)

## 2019-07-30 ENCOUNTER — Other Ambulatory Visit: Payer: Self-pay | Admitting: Internal Medicine

## 2019-07-30 DIAGNOSIS — R7989 Other specified abnormal findings of blood chemistry: Secondary | ICD-10-CM

## 2019-07-30 DIAGNOSIS — E876 Hypokalemia: Secondary | ICD-10-CM

## 2019-07-30 MED ORDER — POTASSIUM CHLORIDE CRYS ER 20 MEQ PO TBCR: 20.0000 meq | EXTENDED_RELEASE_TABLET | Freq: Every day | ORAL | 3 refills | Status: DC

## 2019-08-09 ENCOUNTER — Ambulatory Visit
Admission: RE | Admit: 2019-08-09 | Discharge: 2019-08-09 | Disposition: A | Discharge status: Home / Self Care | Payer: 59 | Source: Ambulatory Visit | Attending: Internal Medicine | Admitting: Internal Medicine

## 2019-08-09 ENCOUNTER — Other Ambulatory Visit: Payer: Self-pay

## 2019-08-09 DIAGNOSIS — R7989 Other specified abnormal findings of blood chemistry: Secondary | ICD-10-CM | POA: Insufficient documentation

## 2019-08-15 ENCOUNTER — Ambulatory Visit (INDEPENDENT_AMBULATORY_CARE_PROVIDER_SITE_OTHER): Payer: 59 | Admitting: Obstetrics and Gynecology

## 2019-08-15 ENCOUNTER — Other Ambulatory Visit: Payer: Self-pay

## 2019-08-15 ENCOUNTER — Encounter: Payer: Self-pay | Admitting: Internal Medicine

## 2019-08-15 ENCOUNTER — Other Ambulatory Visit (HOSPITAL_COMMUNITY)
Admission: RE | Admit: 2019-08-15 | Discharge: 2019-08-15 | Disposition: A | Discharge status: Home / Self Care | Payer: 59 | Source: Ambulatory Visit | Attending: Obstetrics and Gynecology | Admitting: Obstetrics and Gynecology

## 2019-08-15 ENCOUNTER — Encounter: Payer: Self-pay | Admitting: Obstetrics and Gynecology

## 2019-08-15 VITALS — BP 140/93 | HR 103 | Ht 63.0 in | Wt 247.2 lb

## 2019-08-15 DIAGNOSIS — Z01419 Encounter for gynecological examination (general) (routine) without abnormal findings: Secondary | ICD-10-CM | POA: Insufficient documentation

## 2019-08-15 DIAGNOSIS — N644 Mastodynia: Secondary | ICD-10-CM

## 2019-08-15 DIAGNOSIS — Z1231 Encounter for screening mammogram for malignant neoplasm of breast: Secondary | ICD-10-CM

## 2019-08-15 DIAGNOSIS — Z6841 Body Mass Index (BMI) 40.0 and over, adult: Secondary | ICD-10-CM

## 2019-08-15 DIAGNOSIS — I10 Essential (primary) hypertension: Secondary | ICD-10-CM | POA: Diagnosis not present

## 2019-08-15 DIAGNOSIS — R350 Frequency of micturition: Secondary | ICD-10-CM | POA: Diagnosis not present

## 2019-08-15 DIAGNOSIS — N951 Menopausal and female climacteric states: Secondary | ICD-10-CM

## 2019-08-15 LAB — POCT URINALYSIS DIPSTICK
Bilirubin, UA: NEGATIVE
Glucose, UA: NEGATIVE
Ketones, UA: NEGATIVE
Leukocytes, UA: NEGATIVE
Nitrite, UA: NEGATIVE
Protein, UA: POSITIVE — AB
Spec Grav, UA: 1.025 (ref 1.010–1.025)
Urobilinogen, UA: 0.2 E.U./dL
pH, UA: 6 (ref 5.0–8.0)

## 2019-08-15 LAB — HM PAP SMEAR: HM Pap smear: NORMAL

## 2019-08-15 NOTE — Patient Instructions (Signed)
Preventive Care 40-44 Years Old, Female Preventive care refers to visits with your health care provider and lifestyle choices that can promote health and wellness. This includes:  A yearly physical exam. This may also be called an annual well check.  Regular dental visits and eye exams.  Immunizations.  Screening for certain conditions.  Healthy lifestyle choices, such as eating a healthy diet, getting regular exercise, not using drugs or products that contain nicotine and tobacco, and limiting alcohol use. What can I expect for my preventive care visit? Physical exam Your health care provider will check your:  Height and weight. This may be used to calculate body mass index (BMI), which tells if you are at a healthy weight.  Heart rate and blood pressure.  Skin for abnormal spots. Counseling Your health care provider may ask you questions about your:  Alcohol, tobacco, and drug use.  Emotional well-being.  Home and relationship well-being.  Sexual activity.  Eating habits.  Work and work environment.  Method of birth control.  Menstrual cycle.  Pregnancy history. What immunizations do I need?  Influenza (flu) vaccine  This is recommended every year. Tetanus, diphtheria, and pertussis (Tdap) vaccine  You may need a Td booster every 10 years. Varicella (chickenpox) vaccine  You may need this if you have not been vaccinated. Zoster (shingles) vaccine  You may need this after age 60. Measles, mumps, and rubella (MMR) vaccine  You may need at least one dose of MMR if you were born in 1957 or later. You may also need a second dose. Pneumococcal conjugate (PCV13) vaccine  You may need this if you have certain conditions and were not previously vaccinated. Pneumococcal polysaccharide (PPSV23) vaccine  You may need one or two doses if you smoke cigarettes or if you have certain conditions. Meningococcal conjugate (MenACWY) vaccine  You may need this if you  have certain conditions. Hepatitis A vaccine  You may need this if you have certain conditions or if you travel or work in places where you may be exposed to hepatitis A. Hepatitis B vaccine  You may need this if you have certain conditions or if you travel or work in places where you may be exposed to hepatitis B. Haemophilus influenzae type b (Hib) vaccine  You may need this if you have certain conditions. Human papillomavirus (HPV) vaccine  If recommended by your health care provider, you may need three doses over 6 months. You may receive vaccines as individual doses or as more than one vaccine together in one shot (combination vaccines). Talk with your health care provider about the risks and benefits of combination vaccines. What tests do I need? Blood tests  Lipid and cholesterol levels. These may be checked every 5 years, or more frequently if you are over 50 years old.  Hepatitis C test.  Hepatitis B test. Screening  Lung cancer screening. You may have this screening every year starting at age 55 if you have a 30-pack-year history of smoking and currently smoke or have quit within the past 15 years.  Colorectal cancer screening. All adults should have this screening starting at age 50 and continuing until age 75. Your health care provider may recommend screening at age 45 if you are at increased risk. You will have tests every 1-10 years, depending on your results and the type of screening test.  Diabetes screening. This is done by checking your blood sugar (glucose) after you have not eaten for a while (fasting). You may have this   done every 1-3 years.  Mammogram. This may be done every 1-2 years. Talk with your health care provider about when you should start having regular mammograms. This may depend on whether you have a family history of breast cancer.  BRCA-related cancer screening. This may be done if you have a family history of breast, ovarian, tubal, or peritoneal  cancers.  Pelvic exam and Pap test. This may be done every 3 years starting at age 23. Starting at age 86, this may be done every 5 years if you have a Pap test in combination with an HPV test. Other tests  Sexually transmitted disease (STD) testing.  Bone density scan. This is done to screen for osteoporosis. You may have this scan if you are at high risk for osteoporosis. Follow these instructions at home: Eating and drinking  Eat a diet that includes fresh fruits and vegetables, whole grains, lean protein, and low-fat dairy.  Take vitamin and mineral supplements as recommended by your health care provider.  Do not drink alcohol if: ? Your health care provider tells you not to drink. ? You are pregnant, may be pregnant, or are planning to become pregnant.  If you drink alcohol: ? Limit how much you have to 0-1 drink a day. ? Be aware of how much alcohol is in your drink. In the [place] one drink equals one 12 oz bottle of beer (355 mL), one 5 oz glass of wine (148 mL), or one 1 oz glass of hard liquor (44 mL). Lifestyle  Take daily care of your teeth and gums.  Stay active. Exercise for at least 30 minutes on 5 or more days each week.  Do not use any products that contain nicotine or tobacco, such as cigarettes, e-cigarettes, and chewing tobacco. If you need help quitting, ask your health care provider.  If you are sexually active, practice safe sex. Use a condom or other form of birth control (contraception) in order to prevent pregnancy and STIs (sexually transmitted infections).  If told by your health care provider, take low-dose aspirin daily starting at age 27. What's next?  Visit your health care provider once a year for a well check visit.  Ask your health care provider how often you should have your eyes and teeth checked.  Stay up to date on all vaccines. This information is not intended to replace advice given to you by your health care provider. Make sure you  discuss any questions you have with your health care provider. Document Released: 10/31/2015 Document Revised: 06/15/2018 Document Reviewed: 06/15/2018 Elsevier Patient Education  2020 [place] self-awareness is knowing how your breasts look and feel. Doing breast self-awareness is important. It allows you to catch a breast problem early while it is still small and can be treated. All women should do breast self-awareness, including women who have had breast implants. Tell your doctor if you notice a change in your breasts. What you need:  A mirror.  A well-lit room. How to do a breast self-exam A breast self-exam is one way to learn what is normal for your breasts and to check for changes. To do a breast self-exam: Look for changes  1. Take off all the clothes above your waist. 2. Stand in front of a mirror in a room with good lighting. 3. Put your hands on your hips. 4. Push your hands down. 5. Look at your breasts and nipples in the mirror to see if one breast or nipple looks  different from the other. Check to see if: ? The shape of one breast is different. ? The size of one breast is different. ? There are wrinkles, dips, and bumps in one breast and not the other. 6. Look at each breast for changes in the skin, such as: ? Redness. ? Scaly areas. 7. Look for changes in your nipples, such as: ? Liquid around the nipples. ? Bleeding. ? Dimpling. ? Redness. ? A change in where the nipples are. Feel for changes  1. Lie on your back on the floor. 2. Feel each breast. To do this, follow these steps: ? Pick a breast to feel. ? Put the arm closest to that breast above your head. ? Use your other arm to feel the nipple area of your breast. Feel the area with the pads of your three middle fingers by making small circles with your fingers. For the first circle, press lightly. For the second circle, press harder. For the third circle, press even harder.  ? Keep making circles with your fingers at the different pressures as you move down your breast. Stop when you feel your ribs. ? Move your fingers a little toward the center of your body. ? Start making circles with your fingers again, this time going up until you reach your collarbone. ? Keep making up-and-down circles until you reach your armpit. Remember to keep using the three pressures. ? Feel the other breast in the same way. 3. Sit or stand in the tub or shower. 4. With soapy water on your skin, feel each breast the same way you did in step 2 when you were lying on the floor. Write down what you find Writing down what you find can help you remember what to tell your doctor. Write down:  What is normal for each breast.  Any changes you find in each breast, including: ? The kind of changes you find. ? Whether you have pain. ? Size and location of any lumps.  When you last had your menstrual period. General tips  Check your breasts every month.  If you are breastfeeding, the best time to check your breasts is after you feed your baby or after you use a breast pump.  If you get menstrual periods, the best time to check your breasts is 5-7 days after your menstrual period is over.  With time, you will become comfortable with the self-exam, and you will begin to know if there are changes in your breasts. Contact a doctor if you:  See a change in the shape or size of your breasts or nipples.  See a change in the skin of your breast or nipples, such as red or scaly skin.  Have fluid coming from your nipples that is not normal.  Find a lump or thick area that was not there before.  Have pain in your breasts.  Have any concerns about your breast health. Summary  Breast self-awareness includes looking for changes in your breasts, as well as feeling for changes within your breasts.  Breast self-awareness should be done in front of a mirror in a well-lit room.  You should  check your breasts every month. If you get menstrual periods, the best time to check your breasts is 5-7 days after your menstrual period is over.  Let your doctor know of any changes you see in your breasts, including changes in size, changes on the skin, pain or tenderness, or fluid from your nipples that is not normal. This  information is not intended to replace advice given to you by your health care provider. Make sure you discuss any questions you have with your health care provider. Document Released: 03/22/2008 Document Revised: 05/23/2018 Document Reviewed: 05/23/2018 Elsevier Patient Education  2020 [employer].

## 2019-08-15 NOTE — Progress Notes (Signed)
Pt is present for annual exam. Pt stated having some pressure when she urinate UA completed. Pt also stated having left breast pain. Pt's bp was 140/93. Pt currently taking amlodipine and HCTZ but stated that she did not take her medication this morning. Pt stated her cycles are off and on unsure of last full cycle.  Pt declined flu vaccine.  Pt stated that she was doing well.

## 2019-08-15 NOTE — Progress Notes (Signed)
GYNECOLOGY ANNUAL PHYSICAL EXAM PROGRESS NOTE  Subjective:    Valerie Morales is a 43 y.o. [phone] female who presents for an annual exam. The patient has no complaints today. The patient is sexually active. The patient wears seatbelts: yes. The patient participates in regular exercise: yes. Has the patient ever been transfused or tattooed?: no. The patient reports that there is not domestic violence in her life.   1. Patient complains of pressure with urination.  2. Left breast pain, persistent, dull and achy, ~ 1 year. Had mammogram earlier this year, was normal.  3. Cycles are abnormal.  Notes she has not had a "full cycle" in ~ 2 years. Has monthly spotting that comes and goes for 1-2 days.   Gynecologic History Menarche age: 1 No LMP recorded. Patient is perimenopausal. Contraception: tubal ligation History of STI's:  Denies Last Pap: 01/02/2014. Results were: normal.  H/o abnormal pap smears (mild changes). Last mammogram: 01/18/2019. Results were: normal   OB History  Gravida Para Term Preterm AB Living  4 3 3  0 1 3  SAB TAB Ectopic Multiple Live Births  1 0 0 0 3    # Outcome Date GA Lbr Len/2nd Weight Sex Delivery Anes PTL Lv  4 Term 51    M CS-LTranv   LIV  3 Term 46    M CS-LTranv   LIV  2 Term 63    F CS-LTranv   LIV  1 SAB 08/1992            Past Medical History:  Diagnosis Date  . Anemia    has required 2 units PRBC  . Chicken pox   . Depression    trazadone has not helped in the past   . Fatty liver   . Headache   . History of blood transfusion 2009   [place]  . Hypertension   . MRSA (methicillin resistant Staphylococcus aureus)   . Sinusitis   . Sinusitis, chronic   . UTI (urinary tract infection)   . Vitamin D deficiency     Past Surgical History:  Procedure Laterality Date  . CESAREAN SECTION     x3 [place]  . CHOLECYSTECTOMY  1998  . COLONOSCOPY WITH PROPOFOL N/A 05/01/2019   Procedure: COLONOSCOPY WITH PROPOFOL;   Surgeon: Lucilla Lame, MD;  [facility] ENDOSCOPY;  Service: Endoscopy;  Laterality: N/A;  . ESOPHAGOGASTRODUODENOSCOPY (EGD) WITH PROPOFOL N/A 05/01/2019   Procedure: ESOPHAGOGASTRODUODENOSCOPY (EGD) WITH PROPOFOL;  Surgeon: Lucilla Lame, MD;  Location: [facility] ENDOSCOPY;  Service: Endoscopy;  Laterality: N/A;  . 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 NASAL SINUS SURGERY     2009/2010  . uterine ablation  2010    Family History  Problem Relation Age of Onset  . Cancer Mother 55       lung cancer-small cell lung cancer   . Heart disease Mother        MI with stents  . Hypertension Mother   . Diabetes Mother   . COPD Mother   . Hypertension Father   . Hyperlipidemia Father   . Cancer Maternal Aunt        breast cancer  . Breast cancer Maternal Aunt   . Cancer Maternal Uncle        colon cancer  . Mental illness Son   . Alcohol abuse Maternal Aunt        Liver Failure  . Alcohol  abuse Maternal Grandfather   . Heart disease Maternal Grandfather   . Arthritis Paternal Grandmother   . Cancer Paternal Grandmother        leukemia   . Hypertension Paternal Grandmother   . Breast cancer Paternal Grandmother   . Cancer Maternal Grandmother        brain cancer   . Heart disease Paternal Grandfather   . Hypertension Paternal Grandfather     Social History   Socioeconomic History  . Marital status: Married    Spouse name: Not on file  . Number of children: 3  . Years of education: 5  . Highest education level: Not on file  Occupational History  . Occupation: caseworker  Social Needs  . Financial resource strain: Not on file  . Food insecurity    Worry: Not on file    Inability: Not on file  . Transportation needs    Medical: Not on file    Non-medical: Not on file  Tobacco Use  . Smoking status: Never Smoker  . Smokeless tobacco: Never Used  Substance and Sexual Activity  . Alcohol use: Yes    Comment: 1 drink weekly  . Drug use: No  . Sexual  activity: Yes    Birth control/protection: None  Lifestyle  . Physical activity    Days per week: Not on file    Minutes per session: Not on file  . Stress: Not on file  Relationships  . Social Herbalist on phone: Not on file    Gets together: Not on file    Attends religious service: Not on file    Active member of club or organization: Not on file    Attends meetings of clubs or organizations: Not on file    Relationship status: Not on file  . Intimate partner violence    Fear of current or ex partner: Not on file    Emotionally abused: Not on file    Physically abused: Not on file    Forced sexual activity: Not on file  Other Topics Concern  . Not on file  Social History Narrative   Karan "[name]" grew up in [place], [place]. She attended [employer] and obtained her Bachelors in [employer]. She lives in [place] with her 3 children and her husband       Caffeine - 2 coffee, no sodas   Exercise - not currently   Bachelors degree    Caseworker    4 pregnancies, 3 live births    Feels safe in relationship    Wears seatbelt   No guns        Current Outpatient Medications on File Prior to Visit  Medication Sig Dispense Refill  . amLODipine (NORVASC) 5 MG tablet Take 1 tablet (5 mg total) by mouth daily. 90 tablet 3  . cetirizine (ZYRTEC) 10 MG tablet Take 10 mg by mouth daily.    . diphenhydrAMINE (BENADRYL) 25 MG tablet Take 25 mg by mouth daily as needed.    . hydrochlorothiazide (HYDRODIURIL) 25 MG tablet Take 1 tablet (25 mg total) by mouth daily. 90 tablet 3  . LORazepam (ATIVAN) 0.5 MG tablet Take 1 tablet (0.5 mg total) by mouth daily as needed for anxiety. 30 tablet 1  . montelukast (SINGULAIR) 10 MG tablet Take 1 tablet (10 mg total) by mouth at bedtime. 90 tablet 3  . Multiple Vitamins-Minerals (HAIR/SKIN/NAILS PO) Take 1 tablet by mouth daily.    . potassium chloride SA (KLOR-CON) 20  MEQ tablet Take 1 tablet (20 mEq total) by mouth daily. 90  tablet 3  . venlafaxine XR (EFFEXOR-XR) 75 MG 24 hr capsule Take 1 capsule (75 mg total) by mouth daily with breakfast. 90 capsule 3   No current facility-administered medications on file prior to visit.     Allergies  Allergen Reactions  . Aspirin Itching    hives  . Other     ? Nut allergy   . Penicillins Itching    Hives   . Percocet [Oxycodone-Acetaminophen] Hives    Hives   . Latex Rash    Hives       Review of Systems Constitutional: negative for chills, fatigue, fevers and sweats Eyes: negative for irritation, redness and visual disturbance Ears, nose, mouth, throat, and face: negative for hearing loss, nasal congestion, snoring and tinnitus Respiratory: negative for asthma, cough, sputum Cardiovascular: negative for chest pain, dyspnea, exertional chest pressure/discomfort, irregular heart beat, palpitations and syncope Gastrointestinal: negative for abdominal pain, change in bowel habits, nausea and vomiting Genitourinary: positive for abnormal menstrual periods and bladder pressure (see HPI). Negative for genital lesions, sexual problems and vaginal discharge, dysuria and urinary incontinence Integument/breast: negative for breast lump, and nipple discharge. Positive for breast tenderness (left, x 1 year) Hematologic/lymphatic: negative for bleeding and easy bruising Musculoskeletal:negative for back pain and muscle weakness Neurological: negative for dizziness, headaches, vertigo and weakness Endocrine: negative for diabetic symptoms including polydipsia, polyuria and skin dryness Allergic/Immunologic: negative for hay fever and urticaria        Objective:  Blood pressure (!) 140/93, pulse (!) 103, height 5\' 3"  (1.6 m), weight 247 lb 3.2 oz (112.1 kg). Body mass index is 43.79 kg/m.  General Appearance:    Alert, cooperative, no distress, appears stated age, morbid obesity  Head:    Normocephalic, without obvious abnormality, atraumatic  Eyes:    PERRL,  conjunctiva/corneas clear, EOM's intact, both eyes  Ears:    Normal external ear canals, both ears  Nose:   Nares normal, septum midline, mucosa normal, no drainage or sinus tenderness  Throat:   Lips, mucosa, and tongue normal; teeth and gums normal  Neck:   Supple, symmetrical, trachea midline, no adenopathy; thyroid: no enlargement/tenderness/nodules; no carotid bruit or JVD  Back:     Symmetric, no curvature, ROM normal, no CVA tenderness  Lungs:     Clear to auscultation bilaterally, respirations unlabored  Chest Wall:    No tenderness or deformity   Heart:    Regular rate and rhythm, S1 and S2 normal, no murmur, rub or gallop  Breast Exam:    Left breast tenderness.  No masses, or nipple abnormality  Abdomen:     Soft, mildly tender in lower abdomen.  Bowel sounds active all four quadrants, no masses, no organomegaly.    Genitalia:    Pelvic:external genitalia normal, vagina without lesions, discharge, or tenderness, rectovaginal septum  normal. Cervix normal in appearance, no cervical motion tenderness, no adnexal masses or tenderness.  Uterus normal size, shape, mobile, regular contours, nontender.  Rectal:    Normal external sphincter.  No hemorrhoids appreciated. Internal exam not done.   Extremities:   Extremities normal, atraumatic, no cyanosis or edema  Pulses:   2+ and symmetric all extremities  Skin:   Skin color, texture, turgor normal, no rashes or lesions  Lymph nodes:   Cervical, supraclavicular, and axillary nodes normal  Neurologic:   CNII-XII intact, normal strength, sensation and reflexes throughout   .  Labs:  Lab Results  Component Value Date   WBC 6.1 07/25/2019   HGB 13.7 07/25/2019   HCT 41.4 07/25/2019   MCV 93.3 07/25/2019   PLT 220.0 07/25/2019    Lab Results  Component Value Date   CREATININE 0.65 07/25/2019   BUN 10 07/25/2019   NA 139 07/25/2019   K 3.3 (L) 07/25/2019   CL 100 07/25/2019   CO2 27 07/25/2019    Lab Results  Component Value  Date   ALT 43 (H) 07/25/2019   AST 52 (H) 07/25/2019   ALKPHOS 71 07/25/2019   BILITOT 0.4 07/25/2019    Lab Results  Component Value Date   TSH 2.81 07/25/2019     Results for orders placed or performed in visit on 08/15/19  POCT urinalysis dipstick  Result Value Ref Range   Color, UA dark yellow    Clarity, UA clear    Glucose, UA Negative Negative   Bilirubin, UA neg    Ketones, UA neg    Spec Grav, UA 1.025 1.010 - 1.025   Blood, UA non-hem trace    pH, UA 6.0 5.0 - 8.0   Protein, UA Positive (A) Negative   Urobilinogen, UA 0.2 0.2 or 1.0 E.U./dL   Nitrite, UA neg    Leukocytes, UA Negative Negative   Appearance dark yellow    Odor       Assessment:   Encounter for well woman exam with routine gynecological exam Urine frequency Essential hypertension Morbid obesity with BMI of 40.0-44.9, adult  Mastalgia  Plan:     Blood tests: None ordered.  Up to date. . Breast self exam technique reviewed and patient encouraged to perform self-exam monthly. Contraception: tubal ligation. Mammogram up to date. Pap smear performed today. Urinalysis performed, overall normal. No evidence of UTI.   Discussed home care measures for mastalgia.  Abnormal cycles likely due to perimenopausal status. Discussed in detail.  Discussed healthy lifestyle interventions.   HTN managed by PCP. Patient notes she did not take BP meds this morning as she was rushing.  Declines flu vaccine RTC in 1 year for annual exam   Rubie Maid, MD [facility]

## 2019-08-16 ENCOUNTER — Other Ambulatory Visit: Payer: Self-pay | Admitting: Internal Medicine

## 2019-08-16 ENCOUNTER — Encounter: Payer: Self-pay | Admitting: Internal Medicine

## 2019-08-16 DIAGNOSIS — Z6841 Body Mass Index (BMI) 40.0 and over, adult: Secondary | ICD-10-CM

## 2019-08-16 MED ORDER — PHENTERMINE HCL 37.5 MG PO TABS: 37.5000 mg | ORAL_TABLET | Freq: Every day | ORAL | 0 refills | Status: DC

## 2019-08-22 LAB — CYTOLOGY - PAP
Comment: NEGATIVE
Diagnosis: NEGATIVE
High risk HPV: NEGATIVE

## 2019-10-18 ENCOUNTER — Other Ambulatory Visit: Payer: Self-pay | Admitting: Internal Medicine

## 2019-10-18 DIAGNOSIS — I10 Essential (primary) hypertension: Secondary | ICD-10-CM

## 2019-10-18 MED ORDER — AMLODIPINE BESYLATE 5 MG PO TABS: 5.0000 mg | ORAL_TABLET | Freq: Every day | ORAL | 3 refills | Status: DC

## 2019-11-19 ENCOUNTER — Emergency Department: Payer: 59

## 2019-11-19 ENCOUNTER — Emergency Department
Admission: EM | Admit: 2019-11-19 | Discharge: 2019-11-19 | Disposition: A | Discharge status: Home / Self Care | Payer: 59 | Attending: Emergency Medicine | Admitting: Emergency Medicine

## 2019-11-19 ENCOUNTER — Other Ambulatory Visit: Payer: Self-pay

## 2019-11-19 DIAGNOSIS — Z91040 Latex allergy status: Secondary | ICD-10-CM | POA: Diagnosis not present

## 2019-11-19 DIAGNOSIS — M25572 Pain in left ankle and joints of left foot: Secondary | ICD-10-CM | POA: Insufficient documentation

## 2019-11-19 DIAGNOSIS — I10 Essential (primary) hypertension: Secondary | ICD-10-CM | POA: Insufficient documentation

## 2019-11-19 DIAGNOSIS — Z79899 Other long term (current) drug therapy: Secondary | ICD-10-CM | POA: Insufficient documentation

## 2019-11-19 MED ORDER — KETOROLAC TROMETHAMINE 30 MG/ML IJ SOLN
30.0000 mg | Freq: Once | INTRAMUSCULAR | Status: AC
  Administered 2019-11-19: 30 mg via INTRAMUSCULAR
  Filled 2019-11-19: qty 1

## 2019-11-19 NOTE — ED Provider Notes (Signed)
[facility] Emergency Department Provider Note   ____________________________________________   First MD Initiated Contact with Patient 11/19/19 0518     (approximate)  I have reviewed the triage vital signs and the nursing notes.   HISTORY  Chief Complaint Leg Pain    HPI MARCIANA MCGLASHAN is a 44 y.o. female with past medical history of anemia and hypertension who presents to the ED complaining of ankle pain.  Patient reports that for the past several days she has had increasing pain originating in her left ankle.  She describes it as sharp and exacerbated with with any movement or pressure to her foot.  It seems to radiate up her left leg at times.  She denies any recent trauma to her ankle or foot, has dealt with similar pain in the past and was told she has arthritis or plantar fasciitis.  She has not noticed any swelling to her ankle or lower leg, denies any fevers.        Past Medical History:  Diagnosis Date  . Anemia    has required 2 units PRBC  . Chicken pox   . Depression    trazadone has not helped in the past   . Fatty liver   . Headache   . History of blood transfusion 2009   [place]  . Hypertension   . MRSA (methicillin resistant Staphylococcus aureus)   . Sinusitis   . Sinusitis, chronic   . UTI (urinary tract infection)   . Vitamin D deficiency     Patient Active Problem List   Diagnosis Date Noted  . Prediabetes 07/25/2019  . Elevated liver enzymes 07/25/2019  . Diarrhea   . Loose stools 01/18/2019  . Allergic rhinitis 08/11/2018  . Chronic sinusitis 08/11/2018  . Vitamin D deficiency 04/11/2018  . Abnormal mammogram of left breast 04/11/2018  . Plantar fasciitis 01/09/2018  . Essential hypertension 12/18/2017  . Anxiety and depression 12/18/2017  . 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 Anemia 12/15/2017  . Pelvic pain 12/15/2017  . Breast pain 12/15/2017  . Chronic pain of left ankle 12/15/2017  . Obesity  12/15/2017  . Annual physical exam 01/02/2014  . Vaginal discharge 01/02/2014  . Fatigue 12/21/2013  . Hot flashes 12/21/2013  . Sleep disturbance 12/21/2013    Past Surgical History:  Procedure Laterality Date  . CESAREAN SECTION     x3 [place]  . CHOLECYSTECTOMY  1998  . COLONOSCOPY WITH PROPOFOL N/A 05/01/2019   Procedure: COLONOSCOPY WITH PROPOFOL;  Surgeon: Lucilla Lame, MD;  Location: [facility] ENDOSCOPY;  Service: Endoscopy;  Laterality: N/A;  . ESOPHAGOGASTRODUODENOSCOPY (EGD) WITH PROPOFOL N/A 05/01/2019   Procedure: ESOPHAGOGASTRODUODENOSCOPY (EGD) WITH PROPOFOL;  Surgeon: Lucilla Lame, MD;  Location: [facility] ENDOSCOPY;  Service: Endoscopy;  Laterality: N/A;  . GASTRIC BYPASS     ? duodenal switch in [facility]   . NASAL SINUS SURGERY     2009/2010  . uterine ablation  2010    Prior to Admission medications   Medication Sig Start Date End Date Taking? Authorizing Provider  amLODipine (NORVASC) 5 MG tablet Take 1 tablet (5 mg total) by mouth daily. 10/18/19   McLean-Scocuzza, Nino Glow, MD  cetirizine (ZYRTEC) 10 MG tablet Take 10 mg by mouth daily.    [provider]  diphenhydrAMINE (BENADRYL) 25 MG tablet Take 25 mg by mouth daily as needed.    [provider]  hydrochlorothiazide (HYDRODIURIL) 25 MG tablet Take 1 tablet (25 mg total)  by mouth daily. 05/11/19   McLean-Scocuzza, Nino Glow, MD  LORazepam (ATIVAN) 0.5 MG tablet Take 1 tablet (0.5 mg total) by mouth daily as needed for anxiety. 02/09/19   McLean-Scocuzza, Nino Glow, MD  montelukast (SINGULAIR) 10 MG tablet Take 1 tablet (10 mg total) by mouth at bedtime. 01/18/19   McLean-Scocuzza, Nino Glow, MD  Multiple Vitamins-Minerals (HAIR/SKIN/NAILS PO) Take 1 tablet by mouth daily.    [provider]  phentermine (ADIPEX-P) 37.5 MG tablet Take 1 tablet (37.5 mg total) by mouth daily before breakfast. Rx 1/2 08/16/19   McLean-Scocuzza, Nino Glow, MD  phentermine (ADIPEX-P) 37.5 MG tablet Take 1 tablet  (37.5 mg total) by mouth daily before breakfast. Rx 2/2 08/16/19   McLean-Scocuzza, Nino Glow, MD  potassium chloride SA (KLOR-CON) 20 MEQ tablet Take 1 tablet (20 mEq total) by mouth daily. 07/30/19   McLean-Scocuzza, Nino Glow, MD  venlafaxine XR (EFFEXOR-XR) 75 MG 24 hr capsule Take 1 capsule (75 mg total) by mouth daily with breakfast. 02/09/19   McLean-Scocuzza, Nino Glow, MD    Allergies Aspirin, Other, Penicillins, Percocet [oxycodone-acetaminophen], and Latex  Family History  Problem Relation Age of Onset  . Cancer Mother 35       lung cancer-small cell lung cancer   . Heart disease Mother        MI with stents  . Hypertension Mother   . Diabetes Mother   . COPD Mother   . Hypertension Father   . Hyperlipidemia Father   . Cancer Maternal Aunt        breast cancer  . Breast cancer Maternal Aunt   . Cancer Maternal Uncle        colon cancer  . Mental illness Son   . Alcohol abuse Maternal Aunt        Liver Failure  . Alcohol abuse Maternal Grandfather   . Heart disease Maternal Grandfather   . Arthritis Paternal Grandmother   . Cancer Paternal Grandmother        leukemia   . Hypertension Paternal Grandmother   . Breast cancer Paternal Grandmother   . Cancer Maternal Grandmother        brain cancer   . Heart disease Paternal Grandfather   . Hypertension Paternal Grandfather     Social History Social History   Tobacco Use  . Smoking status: Never Smoker  . Smokeless tobacco: Never Used  Substance Use Topics  . Alcohol use: Yes    Comment: 1 drink weekly  . Drug use: No    Review of Systems  Constitutional: No fever/chills Eyes: No visual changes. ENT: No sore throat. Cardiovascular: Denies chest pain. Respiratory: Denies shortness of breath. Gastrointestinal: No abdominal pain.  No nausea, no vomiting.  No diarrhea.  No constipation. Genitourinary: Negative for dysuria. Musculoskeletal: Negative for back pain.  Positive for ankle pain. Skin: Negative for  rash. Neurological: Negative for headaches, focal weakness or numbness.  ____________________________________________   PHYSICAL EXAM:  VITAL SIGNS: ED Triage Vitals [11/19/19 0512]  Enc Vitals Group     BP      Pulse      Resp      Temp      Temp src      SpO2      Weight 237 lb (107.5 kg)     Height 5\' 3"  (1.6 m)     Head Circumference      Peak Flow      Pain Score 9     Pain  Loc      Pain Edu?      Excl. in [place]?     Constitutional: Alert and oriented. Eyes: Conjunctivae are normal. Head: Atraumatic. Nose: No congestion/rhinnorhea. Mouth/Throat: Mucous membranes are moist. Neck: Normal ROM Cardiovascular: Normal rate, regular rhythm. Grossly normal heart sounds. Respiratory: Normal respiratory effort.  No retractions. Lungs CTAB. Gastrointestinal: Soft and nontender. No distention. Genitourinary: deferred Musculoskeletal: Diffuse tenderness of left ankle as well as base of left foot.  No lower extremity edema.  2+ DP pulses bilaterally. Neurologic:  Normal speech and language. No gross focal neurologic deficits are appreciated. Skin:  Skin is warm, dry and intact. No rash noted. Psychiatric: Mood and affect are normal. Speech and behavior are normal.  ____________________________________________   LABS (all labs ordered are listed, but only abnormal results are displayed)  Labs Reviewed - No data to display   PROCEDURES  Procedure(s) performed (including Critical Care):  Procedures   ____________________________________________   INITIAL IMPRESSION / ASSESSMENT AND PLAN / ED COURSE       44 year old female with history of recurrent left ankle pain presents to the ED with acute worsening of this pain over the past several days.  She is neurovascularly intact to her bilateral lower extremities and has 2+ DP pulses bilaterally.  There is no edema or calf tenderness to suggest DVT.  No joint swelling, erythema, or warmth to suggest septic arthritis or  gouty arthritis.  Given she has had questionable fracture in this area in the past, will screen x-ray and treat with dose of Toradol.  Symptoms likely due to osteoarthritis versus plantar fasciitis.  X-ray is negative for acute process, patient states pain is improving following dose of Toradol.  I have counseled her to continue NSAIDs at home along with rest and ice.  Patient has previously seen Dr. Posey Pronto of orthopedics for this problem and I have counseled her to follow-up with him, otherwise return to the ED for new or worsening symptoms.  Patient agrees with plan.      ____________________________________________   FINAL CLINICAL IMPRESSION(S) / ED DIAGNOSES  Final diagnoses:  Acute left ankle pain     ED Discharge Orders    None       Note:  This document was prepared using Dragon voice recognition software and may include unintentional dictation errors.   Blake Divine, MD 11/19/19 0600

## 2019-11-19 NOTE — ED Triage Notes (Signed)
Pt states left leg pain for several days. Pt states pain extends from medial anterior foot up to thigh. Pt is ambulatory, assisted into wheelchair. Pt denies shob, swelling or redness of calf.

## 2019-11-26 ENCOUNTER — Other Ambulatory Visit: Payer: Self-pay | Admitting: Orthopedic Surgery

## 2019-11-26 DIAGNOSIS — G8929 Other chronic pain: Secondary | ICD-10-CM

## 2019-11-26 DIAGNOSIS — M25572 Pain in left ankle and joints of left foot: Secondary | ICD-10-CM

## 2019-11-26 DIAGNOSIS — M25472 Effusion, left ankle: Secondary | ICD-10-CM

## 2019-11-26 DIAGNOSIS — M25372 Other instability, left ankle: Secondary | ICD-10-CM

## 2019-12-03 ENCOUNTER — Ambulatory Visit
Admission: RE | Admit: 2019-12-03 | Discharge: 2019-12-03 | Disposition: A | Discharge status: Home / Self Care | Payer: 59 | Source: Ambulatory Visit | Attending: Orthopedic Surgery | Admitting: Orthopedic Surgery

## 2019-12-03 ENCOUNTER — Other Ambulatory Visit: Payer: Self-pay

## 2019-12-03 DIAGNOSIS — M25372 Other instability, left ankle: Secondary | ICD-10-CM | POA: Insufficient documentation

## 2019-12-03 DIAGNOSIS — G8929 Other chronic pain: Secondary | ICD-10-CM | POA: Diagnosis present

## 2019-12-03 DIAGNOSIS — M25572 Pain in left ankle and joints of left foot: Secondary | ICD-10-CM | POA: Diagnosis not present

## 2019-12-03 DIAGNOSIS — M25472 Effusion, left ankle: Secondary | ICD-10-CM | POA: Diagnosis present

## 2019-12-11 ENCOUNTER — Telehealth: Payer: Self-pay | Admitting: Internal Medicine

## 2019-12-11 NOTE — Telephone Encounter (Signed)
Please advise 

## 2019-12-11 NOTE — Telephone Encounter (Signed)
Pt called wanting something called in for her Ankle pain. She said that nothing over the counter is working

## 2019-12-11 NOTE — Telephone Encounter (Signed)
11/2019 MRI she has plantar fascitis and tendonitis of heel and ankle  Has she tried voltaren gel otc? With Tylenol as need  Tylenol ice/heat as well  Look up stretches for plantar fascitis on [facility] or web [facility] website   She needs to call ortho back asap to get f/u with them or if she wants I can refer her to foot specialist for steroid injection What does she want to do? Does she want referral to foot and ankle or will she call ortho back asap?   [place]

## 2019-12-12 NOTE — Telephone Encounter (Signed)
Left message to return call 

## 2019-12-17 NOTE — Telephone Encounter (Signed)
Pt called returning your call 

## 2019-12-17 NOTE — Telephone Encounter (Signed)
Patient was seen by Dr Vickki Muff last week and started on [place]. Med list updated.   Patient also wanted to be scheduled for her 4-6 month follow up. Scheduled for 4/08 at 2:30 pm.

## 2020-01-21 ENCOUNTER — Ambulatory Visit
Admission: RE | Admit: 2020-01-21 | Discharge: 2020-01-21 | Disposition: A | Discharge status: Home / Self Care | Payer: 59 | Source: Ambulatory Visit | Attending: Internal Medicine | Admitting: Internal Medicine

## 2020-01-21 ENCOUNTER — Other Ambulatory Visit: Payer: Self-pay

## 2020-01-21 DIAGNOSIS — R928 Other abnormal and inconclusive findings on diagnostic imaging of breast: Secondary | ICD-10-CM

## 2020-01-24 ENCOUNTER — Ambulatory Visit: Payer: 59 | Admitting: Internal Medicine

## 2020-01-24 ENCOUNTER — Other Ambulatory Visit: Payer: Self-pay | Admitting: Internal Medicine

## 2020-01-24 ENCOUNTER — Encounter: Payer: Self-pay | Admitting: Internal Medicine

## 2020-01-24 ENCOUNTER — Other Ambulatory Visit: Payer: Self-pay

## 2020-01-24 VITALS — BP 142/80 | HR 114 | Temp 97.8°F | Ht 63.0 in | Wt 235.4 lb

## 2020-01-24 DIAGNOSIS — I10 Essential (primary) hypertension: Secondary | ICD-10-CM | POA: Diagnosis not present

## 2020-01-24 DIAGNOSIS — R7303 Prediabetes: Secondary | ICD-10-CM

## 2020-01-24 DIAGNOSIS — F419 Anxiety disorder, unspecified: Secondary | ICD-10-CM

## 2020-01-24 DIAGNOSIS — F32A Depression, unspecified: Secondary | ICD-10-CM

## 2020-01-24 DIAGNOSIS — M25572 Pain in left ankle and joints of left foot: Secondary | ICD-10-CM

## 2020-01-24 DIAGNOSIS — Z6841 Body Mass Index (BMI) 40.0 and over, adult: Secondary | ICD-10-CM

## 2020-01-24 DIAGNOSIS — F329 Major depressive disorder, single episode, unspecified: Secondary | ICD-10-CM

## 2020-01-24 MED ORDER — PHENTERMINE HCL 37.5 MG PO TABS: 37.5000 mg | ORAL_TABLET | Freq: Every day | ORAL | 0 refills | Status: DC

## 2020-01-24 MED ORDER — LORAZEPAM 0.5 MG PO TABS: 0.5000 mg | ORAL_TABLET | Freq: Every day | ORAL | 2 refills | Status: DC | PRN

## 2020-01-24 MED ORDER — VENLAFAXINE HCL ER 150 MG PO CP24: 150.0000 mg | ORAL_CAPSULE | Freq: Every day | ORAL | 3 refills | Status: DC

## 2020-01-24 NOTE — Patient Instructions (Addendum)
[employer]   Hypertension, Adult High blood pressure (hypertension) is when the force of blood pumping through the arteries is too strong. The arteries are the blood vessels that carry blood from the heart throughout the body. Hypertension forces the heart to work harder to pump blood and may cause arteries to become narrow or stiff. Untreated or uncontrolled hypertension can cause a heart attack, heart failure, a stroke, kidney disease, and other problems. A blood pressure reading consists of a higher number over a lower number. Ideally, your blood pressure should be below 120/80. The first ("top") number is called the systolic pressure. It is a measure of the pressure in your arteries as your heart beats. The second ("bottom") number is called the diastolic pressure. It is a measure of the pressure in your arteries as the heart relaxes. What are the causes? The exact cause of this condition is not known. There are some conditions that result in or are related to high blood pressure. What increases the risk? Some risk factors for high blood pressure are under your control. The following factors may make you more likely to develop this condition:  Smoking.  Having type 2 diabetes mellitus, high cholesterol, or both.  Not getting enough exercise or physical activity.  Being overweight.  Having too much fat, sugar, calories, or salt (sodium) in your diet.  Drinking too much alcohol. Some risk factors for high blood pressure may be difficult or impossible to change. Some of these factors include:  Having chronic kidney disease.  Having a family history of high blood pressure.  Age. Risk increases with age.  Race. You may be at higher risk if you are African American.  Gender. Men are at higher risk than women before age 65. After age 108, women are at higher risk than men.  Having obstructive sleep apnea.  Stress. What are the signs or symptoms? High blood pressure may not cause  symptoms. Very high blood pressure (hypertensive crisis) may cause:  Headache.  Anxiety.  Shortness of breath.  Nosebleed.  Nausea and vomiting.  Vision changes.  Severe chest pain.  Seizures. How is this diagnosed? This condition is diagnosed by measuring your blood pressure while you are seated, with your arm resting on a flat surface, your legs uncrossed, and your feet flat on the floor. The cuff of the blood pressure monitor will be placed directly against the skin of your upper arm at the level of your heart. It should be measured at least twice using the same arm. Certain conditions can cause a difference in blood pressure between your right and left arms. Certain factors can cause blood pressure readings to be lower or higher than normal for a short period of time:  When your blood pressure is higher when you are in a health care provider's office than when you are at home, this is called white coat hypertension. Most people with this condition do not need medicines.  When your blood pressure is higher at home than when you are in a health care provider's office, this is called masked hypertension. Most people with this condition may need medicines to control blood pressure. If you have a high blood pressure reading during one visit or you have normal blood pressure with other risk factors, you may be asked to:  Return on a different day to have your blood pressure checked again.  Monitor your blood pressure at home for 1 week or longer. If you are diagnosed with hypertension, you  may have other blood or imaging tests to help your health care provider understand your overall risk for other conditions. How is this treated? This condition is treated by making healthy lifestyle changes, such as eating healthy foods, exercising more, and reducing your alcohol intake. Your health care provider may prescribe medicine if lifestyle changes are not enough to get your blood pressure under  control, and if:  Your systolic blood pressure is above 130.  Your diastolic blood pressure is above 80. Your personal target blood pressure may vary depending on your medical conditions, your age, and other factors. Follow these instructions at home: Eating and drinking   Eat a diet that is high in fiber and potassium, and low in sodium, added sugar, and fat. An example eating plan is called the DASH (Dietary Approaches to Stop Hypertension) diet. To eat this way: ? Eat plenty of fresh fruits and vegetables. Try to fill one half of your plate at each meal with fruits and vegetables. ? Eat whole grains, such as whole-wheat pasta, brown rice, or whole-grain bread. Fill about one fourth of your plate with whole grains. ? Eat or drink low-fat dairy products, such as skim milk or low-fat yogurt. ? Avoid fatty cuts of meat, processed or cured meats, and poultry with skin. Fill about one fourth of your plate with lean proteins, such as fish, chicken without skin, beans, eggs, or tofu. ? Avoid pre-made and processed foods. These tend to be higher in sodium, added sugar, and fat.  Reduce your daily sodium intake. Most people with hypertension should eat less than 1,500 mg of sodium a day.  Do not drink alcohol if: ? Your health care provider tells you not to drink. ? You are pregnant, may be pregnant, or are planning to become pregnant.  If you drink alcohol: ? Limit how much you use to:  0-1 drink a day for women.  0-2 drinks a day for men. ? Be aware of how much alcohol is in your drink. In the [place] one drink equals one 12 oz bottle of beer (355 mL), one 5 oz glass of wine (148 mL), or one 1 oz glass of hard liquor (44 mL). Lifestyle   Work with your health care provider to maintain a healthy body weight or to lose weight. Ask what an ideal weight is for you.  Get at least 30 minutes of exercise most days of the week. Activities may include walking, swimming, or biking.  Include  exercise to strengthen your muscles (resistance exercise), such as Pilates or lifting weights, as part of your weekly exercise routine. Try to do these types of exercises for 30 minutes at least 3 days a week.  Do not use any products that contain nicotine or tobacco, such as cigarettes, e-cigarettes, and chewing tobacco. If you need help quitting, ask your health care provider.  Monitor your blood pressure at home as told by your health care provider.  Keep all follow-up visits as told by your health care provider. This is important. Medicines  Take over-the-counter and prescription medicines only as told by your health care provider. Follow directions carefully. Blood pressure medicines must be taken as prescribed.  Do not skip doses of blood pressure medicine. Doing this puts you at risk for problems and can make the medicine less effective.  Ask your health care provider about side effects or reactions to medicines that you should watch for. Contact a health care provider if you:  Think you are having a  reaction to a medicine you are taking.  Have headaches that keep coming back (recurring).  Feel dizzy.  Have swelling in your ankles.  Have trouble with your vision. Get help right away if you:  Develop a severe headache or confusion.  Have unusual weakness or numbness.  Feel faint.  Have severe pain in your chest or abdomen.  Vomit repeatedly.  Have trouble breathing. Summary  Hypertension is when the force of blood pumping through your arteries is too strong. If this condition is not controlled, it may put you at risk for serious complications.  Your personal target blood pressure may vary depending on your medical conditions, your age, and other factors. For most people, a normal blood pressure is less than 120/80.  Hypertension is treated with lifestyle changes, medicines, or a combination of both. Lifestyle changes include losing weight, eating a healthy,  low-sodium diet, exercising more, and limiting alcohol. This information is not intended to replace advice given to you by your health care provider. Make sure you discuss any questions you have with your health care provider. Document Revised: 06/14/2018 Document Reviewed: 06/14/2018 Elsevier Patient Education  [place] DASH stands for "Dietary Approaches to Stop Hypertension." The DASH eating plan is a healthy eating plan that has been shown to reduce high blood pressure (hypertension). It may also reduce your risk for type 2 diabetes, heart disease, and stroke. The DASH eating plan may also help with weight loss. What are tips for following this plan?  General guidelines  Avoid eating more than 2,300 mg (milligrams) of salt (sodium) a day. If you have hypertension, you may need to reduce your sodium intake to 1,500 mg a day.  Limit alcohol intake to no more than 1 drink a day for nonpregnant women and 2 drinks a day for men. One drink equals 12 oz of beer, 5 oz of wine, or 1 oz of hard liquor.  Work with your health care provider to maintain a healthy body weight or to lose weight. Ask what an ideal weight is for you.  Get at least 30 minutes of exercise that causes your heart to beat faster (aerobic exercise) most days of the week. Activities may include walking, swimming, or biking.  Work with your health care provider or diet and nutrition specialist (dietitian) to adjust your eating plan to your individual calorie needs. Reading food labels   Check food labels for the amount of sodium per serving. Choose foods with less than 5 percent of the Daily Value of sodium. Generally, foods with less than 300 mg of sodium per serving fit into this eating plan.  To find whole grains, look for the word "whole" as the first word in the ingredient list. Shopping  Buy products labeled as "low-sodium" or "no salt added."  Buy fresh foods. Avoid canned foods and  premade or frozen meals. Cooking  Avoid adding salt when cooking. Use salt-free seasonings or herbs instead of table salt or sea salt. Check with your health care provider or pharmacist before using salt substitutes.  Do not fry foods. Cook foods using healthy methods such as baking, boiling, grilling, and broiling instead.  Cook with heart-healthy oils, such as olive, canola, soybean, or sunflower oil. Meal planning  Eat a balanced diet that includes: ? 5 or more servings of fruits and vegetables each day. At each meal, try to fill half of your plate with fruits and vegetables. ? Up to 6-8 servings of whole grains  each day. ? Less than 6 oz of lean meat, poultry, or fish each day. A 3-oz serving of meat is about the same size as a deck of cards. One egg equals 1 oz. ? 2 servings of low-fat dairy each day. ? A serving of nuts, seeds, or beans 5 times each week. ? Heart-healthy fats. Healthy fats called Omega-3 fatty acids are found in foods such as flaxseeds and coldwater fish, like sardines, salmon, and mackerel.  Limit how much you eat of the following: ? Canned or prepackaged foods. ? Food that is high in trans fat, such as fried foods. ? Food that is high in saturated fat, such as fatty meat. ? Sweets, desserts, sugary drinks, and other foods with added sugar. ? Full-fat dairy products.  Do not salt foods before eating.  Try to eat at least 2 vegetarian meals each week.  Eat more home-cooked food and less restaurant, buffet, and fast food.  When eating at a restaurant, ask that your food be prepared with less salt or no salt, if possible. What foods are recommended? The items listed may not be a complete list. Talk with your dietitian about what dietary choices are best for you. Grains Whole-grain or whole-wheat bread. Whole-grain or whole-wheat pasta. Brown rice. [place]. Bulgur. Whole-grain and low-sodium cereals. Pita bread. Low-fat, low-sodium crackers. Whole-wheat  flour tortillas. Vegetables Fresh or frozen vegetables (raw, steamed, roasted, or grilled). Low-sodium or reduced-sodium tomato and vegetable juice. Low-sodium or reduced-sodium tomato sauce and tomato paste. Low-sodium or reduced-sodium canned vegetables. Fruits All fresh, dried, or frozen fruit. Canned fruit in natural juice (without added sugar). Meat and other protein foods Skinless chicken or [place]. Ground chicken or [place]. Pork with fat trimmed off. Fish and seafood. Egg whites. Dried beans, peas, or lentils. Unsalted nuts, nut butters, and seeds. Unsalted canned beans. Lean cuts of beef with fat trimmed off. Low-sodium, lean deli meat. Dairy Low-fat (1%) or fat-free (skim) milk. Fat-free, low-fat, or reduced-fat cheeses. Nonfat, low-sodium ricotta or cottage cheese. Low-fat or nonfat yogurt. Low-fat, low-sodium cheese. Fats and oils Soft margarine without trans fats. Vegetable oil. Low-fat, reduced-fat, or light mayonnaise and salad dressings (reduced-sodium). Canola, safflower, olive, soybean, and sunflower oils. Avocado. Seasoning and other foods Herbs. Spices. Seasoning mixes without salt. Unsalted popcorn and pretzels. Fat-free sweets. What foods are not recommended? The items listed may not be a complete list. Talk with your dietitian about what dietary choices are best for you. Grains Baked goods made with fat, such as croissants, muffins, or some breads. Dry pasta or rice meal packs. Vegetables Creamed or fried vegetables. Vegetables in a cheese sauce. Regular canned vegetables (not low-sodium or reduced-sodium). Regular canned tomato sauce and paste (not low-sodium or reduced-sodium). Regular tomato and vegetable juice (not low-sodium or reduced-sodium). Angie Fava. Olives. Fruits Canned fruit in a light or heavy syrup. Fried fruit. Fruit in cream or butter sauce. Meat and other protein foods Fatty cuts of meat. Ribs. Fried meat. Berniece Salines. Sausage. Bologna and other processed lunch  meats. Salami. Fatback. Hotdogs. Bratwurst. Salted nuts and seeds. Canned beans with added salt. Canned or smoked fish. Whole eggs or egg yolks. Chicken or [place] with skin. Dairy Whole or 2% milk, cream, and half-and-half. Whole or full-fat cream cheese. Whole-fat or sweetened yogurt. Full-fat cheese. Nondairy creamers. Whipped toppings. Processed cheese and cheese spreads. Fats and oils Butter. Stick margarine. Lard. Shortening. Ghee. Bacon fat. Tropical oils, such as coconut, palm kernel, or palm oil. Seasoning and other foods Salted popcorn and  pretzels. Onion salt, garlic salt, seasoned salt, table salt, and sea salt. Worcestershire sauce. Tartar sauce. Barbecue sauce. Teriyaki sauce. Soy sauce, including reduced-sodium. Steak sauce. Canned and packaged gravies. Fish sauce. Oyster sauce. Cocktail sauce. Horseradish that you find on the shelf. Ketchup. Mustard. Meat flavorings and tenderizers. Bouillon cubes. Hot sauce and Tabasco sauce. Premade or packaged marinades. Premade or packaged taco seasonings. Relishes. Regular salad dressings. Where to find more information:  National Heart, Lung, and [place]: https://wilson-eaton.com/  American Heart Association: www.heart.org Summary  The DASH eating plan is a healthy eating plan that has been shown to reduce high blood pressure (hypertension). It may also reduce your risk for type 2 diabetes, heart disease, and stroke.  With the DASH eating plan, you should limit salt (sodium) intake to 2,300 mg a day. If you have hypertension, you may need to reduce your sodium intake to 1,500 mg a day.  When on the DASH eating plan, aim to eat more fresh fruits and vegetables, whole grains, lean proteins, low-fat dairy, and heart-healthy fats.  Work with your health care provider or diet and nutrition specialist (dietitian) to adjust your eating plan to your individual calorie needs. This information is not intended to replace advice given to you by your  health care provider. Make sure you discuss any questions you have with your health care provider. Document Revised: 09/16/2017 Document Reviewed: 09/27/2016 Elsevier Patient Education  2020 [employer].

## 2020-01-24 NOTE — Progress Notes (Signed)
Chief Complaint  Patient presents with  . Follow-up   F/u  1. HTN elevated on hctz 25 mg qd, norvasc 5 mg qd  2. Anxiety effexor 75 mg xr and ativan 0.5 mg qd prn GAD 7 score 13 today wants referral therapy and increase effexor dose  3. Left chronic ankle pain in boot chronic x 2 months and injections x 2 and PT w/o help   Review of Systems  Constitutional: Negative for weight loss.  HENT: Negative for hearing loss.   Eyes: Negative for blurred vision.  Respiratory: Negative for shortness of breath.   Cardiovascular: Negative for chest pain.  Gastrointestinal: Negative for abdominal pain.  Skin: Negative for rash.  Neurological: Negative for headaches.  Psychiatric/Behavioral: The patient is nervous/anxious.    Past Medical History:  Diagnosis Date  . Anemia    has required 2 units PRBC  . Chicken pox   . Depression    trazadone has not helped in the past   . Fatty liver   . Headache   . History of blood transfusion 2009   [facility]  . Hypertension   . MRSA (methicillin resistant Staphylococcus aureus)   . Sinusitis   . Sinusitis, chronic   . UTI (urinary tract infection)   . Vitamin D deficiency    Past Surgical History:  Procedure Laterality Date  . CESAREAN SECTION     x3 [place]  . CHOLECYSTECTOMY  1998  . COLONOSCOPY WITH PROPOFOL N/A 05/01/2019   Procedure: COLONOSCOPY WITH PROPOFOL;  Surgeon: Lucilla Lame, MD;  Location: [facility] ENDOSCOPY;  Service: Endoscopy;  Laterality: N/A;  . ESOPHAGOGASTRODUODENOSCOPY (EGD) WITH PROPOFOL N/A 05/01/2019   Procedure: ESOPHAGOGASTRODUODENOSCOPY (EGD) WITH PROPOFOL;  Surgeon: Lucilla Lame, MD;  Location: [facility] ENDOSCOPY;  Service: Endoscopy;  Laterality: N/A;  . 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 NASAL SINUS SURGERY     2009/2010  . uterine ablation  2010   Family History  Problem Relation Age of Onset  . Cancer Mother 47       lung cancer-small cell lung cancer   . Heart disease Mother         MI with stents  . Hypertension Mother   . Diabetes Mother   . COPD Mother   . Hypertension Father   . Hyperlipidemia Father   . Cancer Maternal Aunt        breast cancer  . Breast cancer Maternal Aunt   . Cancer Maternal Uncle        colon cancer  . Mental illness Son   . Alcohol abuse Maternal Aunt        Liver Failure  . Alcohol abuse Maternal Grandfather   . Heart disease Maternal Grandfather   . Arthritis Paternal Grandmother   . Cancer Paternal Grandmother        leukemia   . Hypertension Paternal Grandmother   . Breast cancer Paternal Grandmother   . Cancer Maternal Grandmother        brain cancer   . Heart disease Paternal Grandfather   . Hypertension Paternal Grandfather    Social History   Socioeconomic History  . Marital status: Married    Spouse name: Not on file  . Number of children: 3  . Years of education: 15  . Highest education level: Not on file  Occupational History  . Occupation: caseworker  Tobacco Use  . Smoking status: Never Smoker  . Smokeless tobacco: Never  Used  Substance and Sexual Activity  . Alcohol use: Yes    Comment: 1 drink weekly  . Drug use: No  . Sexual activity: Yes    Birth control/protection: None  Other Topics Concern  . Not on file  Social History Narrative   Kinslei "[name]" grew up in [place], [place]. She attended [employer] and obtained her Bachelors in [employer]. She lives in [place] with her 3 children and her husband       Caffeine - 2 coffee, no sodas   Exercise - not currently   Bachelors degree    Caseworker    4 pregnancies, 3 live births    Feels safe in relationship    Wears seatbelt   No guns       Social Determinants of Health   Financial Resource Strain:   . Difficulty of Paying Living Expenses:   Food Insecurity:   . Worried About Charity fundraiser in the Last Year:   . Arboriculturist in the Last Year:   Transportation Needs:   . Film/video editor (Medical):   [name] Kitchen Lack of  Transportation (Non-Medical):   Physical Activity:   . Days of Exercise per Week:   . Minutes of Exercise per Session:   Stress:   . Feeling of Stress :   Social Connections:   . Frequency of Communication with Friends and Family:   . Frequency of Social Gatherings with Friends and Family:   . Attends Religious Services:   . Active Member of Clubs or Organizations:   . Attends Archivist Meetings:   [name] Kitchen Marital Status:   Intimate Partner Violence:   . Fear of Current or Ex-Partner:   . Emotionally Abused:   [name] Kitchen Physically Abused:   . Sexually Abused:    Current Meds  Medication Sig  . amLODipine (NORVASC) 5 MG tablet Take 1 tablet (5 mg total) by mouth daily.  . cetirizine (ZYRTEC) 10 MG tablet Take 10 mg by mouth daily.  . COLLAGEN PO Take by mouth.  . etodolac (LODINE) 400 MG tablet Take by mouth.  . Folic Acid (FOLATE PO) Take by mouth.  [name] Kitchen LORazepam (ATIVAN) 0.5 MG tablet Take 1 tablet (0.5 mg total) by mouth daily as needed for anxiety.  . Misc Natural Products (TART CHERRY ADVANCED PO) Take by mouth.  . Multiple Vitamins-Minerals (HAIR/SKIN/NAILS PO) Take 1 tablet by mouth daily.  . phentermine (ADIPEX-P) 37.5 MG tablet Take 1 tablet (37.5 mg total) by mouth daily before breakfast. Rx 2/2  . potassium chloride SA (KLOR-CON) 20 MEQ tablet Take 1 tablet (20 mEq total) by mouth daily.  [name] Kitchen venlafaxine XR (EFFEXOR-XR) 150 MG 24 hr capsule Take 1 capsule (150 mg total) by mouth daily with breakfast.  . VITAMIN D PO Take by mouth.  . [DISCONTINUED] hydrochlorothiazide (HYDRODIURIL) 25 MG tablet Take 1 tablet (25 mg total) by mouth daily.  . [DISCONTINUED] LORazepam (ATIVAN) 0.5 MG tablet Take 1 tablet (0.5 mg total) by mouth daily as needed for anxiety.  . [DISCONTINUED] phentermine (ADIPEX-P) 37.5 MG tablet Take 1 tablet (37.5 mg total) by mouth daily before breakfast. Rx 2/2  . [DISCONTINUED] venlafaxine XR (EFFEXOR-XR) 75 MG 24 hr capsule Take 1 capsule (75 mg total) by  mouth daily with breakfast.   Allergies  Allergen Reactions  . Aspirin Itching    hives  . Other     ? Nut allergy   . Penicillins Itching    Hives   .  Percocet [Oxycodone-Acetaminophen] Hives    Hives   . Latex Rash    Hives    No results found for this or any previous visit (from the past 2160 hour(s)). Objective  Body mass index is 41.7 kg/m. Wt Readings from Last 3 Encounters:  01/24/20 235 lb 6.4 oz (106.8 kg)  11/19/19 237 lb (107.5 kg)  08/15/19 247 lb 3.2 oz (112.1 kg)   Temp Readings from Last 3 Encounters:  01/24/20 97.8 F (36.6 C) (Temporal)  11/19/19 (!) 97.5 F (36.4 C) (Oral)  07/25/19 98 F (36.7 C) (Oral)   BP Readings from Last 3 Encounters:  01/24/20 (!) 142/80  11/19/19 112/77  08/15/19 (!) 140/93   Pulse Readings from Last 3 Encounters:  01/24/20 (!) 114  11/19/19 91  08/15/19 (!) 103    Physical Exam Vitals and nursing note reviewed.  Constitutional:      Appearance: Normal appearance. She is well-developed and well-groomed. She is obese.  HENT:     Head: Normocephalic and atraumatic.  Eyes:     Conjunctiva/sclera: Conjunctivae normal.     Pupils: Pupils are equal, round, and reactive to light.  Cardiovascular:     Rate and Rhythm: Normal rate and regular rhythm.     Heart sounds: Normal heart sounds. No murmur.  Pulmonary:     Effort: Pulmonary effort is normal.     Breath sounds: Normal breath sounds.  Abdominal:     General: Abdomen is flat. Bowel sounds are normal.     Tenderness: There is no abdominal tenderness.  Skin:    General: Skin is warm and dry.  Neurological:     General: No focal deficit present.     Mental Status: She is alert and oriented to person, place, and time. Mental status is at baseline.     Gait: Gait normal.  Psychiatric:        Attention and Perception: Attention and perception normal.        Mood and Affect: Mood and affect normal.        Speech: Speech normal.        Behavior: Behavior normal.  Behavior is cooperative.        Thought Content: Thought content normal.        Cognition and Memory: Cognition and memory normal.        Judgment: Judgment normal.     Assessment  Plan  Anxiety and depression - Plan: venlafaxine XR (EFFEXOR-XR) 150 MG 24 hr capsule, LORazepam (ATIVAN) 0.5 MG tablet, Ambulatory referral to  SEL group   Morbid obesity with BMI of 40.0-44.9, adult ([place]) - Plan: phentermine (ADIPEX-P) 37.5 MG tablet, phentermine (ADIPEX-P) 37.5 MG tablet  Sinus tarsi syndrome of left ankle F/u podiatry   Essential hypertension - Plan: olmesartan-hydrochlorothiazide (BENICAR HCT) 20-12.5 MG tablet  D/c hctz 25 mg qd BP check upcomig  HM Declinedflu shot  Tdaphad Declines hep B status checkand MMR check Declines covid 19 vaccine  Declines STD check    Pap had 09/07/17 Dr. Marcelline Mates get records appt sch 08/15/19  mammo abnormal 01/2018 repeat dx mammo and [place] left, had 07/19/18 repeat ordered 4/2/2020dx mammo repeat 4/5//21 neg Screening 01/2021  EGD/colonoscpy had 05/01/19 [place] bx negative    Provider: Dr. Olivia Mackie McLean-Scocuzza-Internal Medicine

## 2020-01-25 ENCOUNTER — Encounter: Payer: Self-pay | Admitting: Internal Medicine

## 2020-01-25 ENCOUNTER — Telehealth: Payer: Self-pay | Admitting: Internal Medicine

## 2020-01-25 MED ORDER — OLMESARTAN MEDOXOMIL-HCTZ 20-12.5 MG PO TABS: 1.0000 | ORAL_TABLET | Freq: Every day | ORAL | 3 refills | Status: DC

## 2020-01-25 NOTE — Telephone Encounter (Signed)
Cytology - PAP Order: [id number] Status:  Edited Result - FINAL Visible to patient:  Yes (MyChart) Next appt:  02/07/2020 at 09:30 AM in Family Medicine [facility]) Dx:  Encounter for well woman exam with ro... Component 5 mo ago  High risk HPV Negative   Adequacy Satisfactory for evaluation; transformation zone component PRESENT.   Diagnosis - Negative for intraepithelial lesion or malignancy (NILM)   Comment Normal Reference Range HPV - Negative   Resulting Agency [facility]      Specimen Collected: 08/15/19 08:19 Last Resulted: 08/22/19 08:10     Lab Flowsheet   Order Details   View Encounter   Lab and Collection Details   Routing   Result History       Linked Documents  View Image    Result Communications  Comments to Patient Seen by patient Valerie Morales on 11/19/2019 9:41 AM EST  [name],   Your pap smear is normal. Continue routine screening.

## 2020-01-25 NOTE — Telephone Encounter (Signed)
Patient has been informed.

## 2020-01-25 NOTE — Telephone Encounter (Signed)
Dr. Marcelline Mates need last pap ob/gyn   [place]

## 2020-01-25 NOTE — Telephone Encounter (Signed)
Stop hctz and start benicar 20-12.5 mg daily for blood pressure

## 2020-02-07 ENCOUNTER — Other Ambulatory Visit: Payer: Self-pay | Admitting: Internal Medicine

## 2020-02-07 ENCOUNTER — Other Ambulatory Visit: Payer: Self-pay

## 2020-02-07 ENCOUNTER — Encounter: Payer: Self-pay | Admitting: Internal Medicine

## 2020-02-07 ENCOUNTER — Ambulatory Visit (INDEPENDENT_AMBULATORY_CARE_PROVIDER_SITE_OTHER): Payer: 59

## 2020-02-07 ENCOUNTER — Other Ambulatory Visit (INDEPENDENT_AMBULATORY_CARE_PROVIDER_SITE_OTHER): Payer: 59

## 2020-02-07 VITALS — BP 140/80

## 2020-02-07 DIAGNOSIS — I10 Essential (primary) hypertension: Secondary | ICD-10-CM

## 2020-02-07 DIAGNOSIS — K760 Fatty (change of) liver, not elsewhere classified: Secondary | ICD-10-CM | POA: Insufficient documentation

## 2020-02-07 DIAGNOSIS — R7303 Prediabetes: Secondary | ICD-10-CM

## 2020-02-07 DIAGNOSIS — E876 Hypokalemia: Secondary | ICD-10-CM

## 2020-02-07 LAB — COMPREHENSIVE METABOLIC PANEL
ALT: 67 U/L — ABNORMAL HIGH (ref 0–35)
AST: 94 U/L — ABNORMAL HIGH (ref 0–37)
Albumin: 4.1 g/dL (ref 3.5–5.2)
Alkaline Phosphatase: 80 U/L (ref 39–117)
BUN: 8 mg/dL (ref 6–23)
CO2: 29 mEq/L (ref 19–32)
Calcium: 8.9 mg/dL (ref 8.4–10.5)
Chloride: 98 mEq/L (ref 96–112)
Creatinine, Ser: 0.64 mg/dL (ref 0.40–1.20)
GFR: 121.61 mL/min (ref >60.00)
Glucose, Bld: 100 mg/dL — ABNORMAL HIGH (ref 70–99)
Potassium: 3.3 mEq/L — ABNORMAL LOW (ref 3.5–5.1)
Sodium: 138 mEq/L (ref 135–145)
Total Bilirubin: 0.5 mg/dL (ref 0.2–1.2)
Total Protein: 7.1 g/dL (ref 6.0–8.3)

## 2020-02-07 LAB — CBC WITH DIFFERENTIAL/PLATELET
Basophils Absolute: 0 10*3/uL (ref 0.0–0.1)
Basophils Relative: 0.6 % (ref 0.0–3.0)
Eosinophils Absolute: 0.1 10*3/uL (ref 0.0–0.7)
Eosinophils Relative: 2.1 % (ref 0.0–5.0)
HCT: 40.7 % (ref 36.0–46.0)
Hemoglobin: 13.5 g/dL (ref 12.0–15.0)
Lymphocytes Relative: 28.6 % (ref 12.0–46.0)
Lymphs Abs: 2 10*3/uL (ref 0.7–4.0)
MCHC: 33.3 g/dL (ref 30.0–36.0)
MCV: 95.7 fl (ref 78.0–100.0)
Monocytes Absolute: 0.4 10*3/uL (ref 0.1–1.0)
Monocytes Relative: 5.5 % (ref 3.0–12.0)
Neutro Abs: 4.5 10*3/uL (ref 1.4–7.7)
Neutrophils Relative %: 63.2 % (ref 43.0–77.0)
Platelets: 235 10*3/uL (ref 150.0–400.0)
RBC: 4.25 Mil/uL (ref 3.87–5.11)
RDW: 14.6 % (ref 11.5–15.5)
WBC: 7.1 10*3/uL (ref 4.0–10.5)

## 2020-02-07 LAB — LIPID PANEL
Cholesterol: 199 mg/dL (ref 0–200)
HDL: 75.8 mg/dL (ref >39.00)
LDL Cholesterol: 100 mg/dL — ABNORMAL HIGH (ref 0–99)
NonHDL: 122.99
Total CHOL/HDL Ratio: 3
Triglycerides: 114 mg/dL (ref 0.0–149.0)
VLDL: 22.8 mg/dL (ref 0.0–40.0)

## 2020-02-07 LAB — HEMOGLOBIN A1C: Hgb A1c MFr Bld: 5.6 % (ref 4.6–6.5)

## 2020-02-07 MED ORDER — OLMESARTAN MEDOXOMIL-HCTZ 40-25 MG PO TABS: 1.0000 | ORAL_TABLET | Freq: Every day | ORAL | 3 refills | Status: DC

## 2020-02-07 MED ORDER — POTASSIUM CHLORIDE CRYS ER 20 MEQ PO TBCR: 20.0000 meq | EXTENDED_RELEASE_TABLET | Freq: Two times a day (BID) | ORAL | 3 refills | Status: DC

## 2020-02-07 NOTE — Progress Notes (Signed)
Patient here for nurse visit BP check per order from Dr. Olivia Mackie Mclean-Scocuzza .   Patient reports compliance with prescribed BP medications: yes  Last dose of BP medication: This morning   BP Readings from Last 3 Encounters:  02/07/20 140/80  01/24/20 (!) 142/80  11/19/19 112/77   Pulse Readings from Last 3 Encounters:  01/24/20 (!) 114  11/19/19 91  08/15/19 (!) 103    Per Dr. Mclean-Scocuzza   The patient's medications was increased the benicar/HCTZ and the potassium also and the patient was instructed to take BP readings at home and I informed her that she could send the readings through mychart to the provider.     Patient verbalized understanding of instructions.   Gordy Councilman, CMA

## 2020-02-07 NOTE — Progress Notes (Signed)
Potassium slightly low rec potassium 2x per day and increased benicar to 40-25 mg daily for blood pressure  Triglycerides improved but LDL increased   Liver enzymes elevated likely due to fatty liver on [place] -what supplements is she taking over the counter ? -is she drinking etoh?  -rec exercise to lose weight and continue to control sugar and cholesterol   Blood cts normal   A1C 5.6=normal improved and no longer prediabetes

## 2020-02-12 ENCOUNTER — Encounter: Payer: Self-pay | Admitting: Internal Medicine

## 2020-02-18 ENCOUNTER — Encounter: Payer: Self-pay | Admitting: Internal Medicine

## 2020-02-18 ENCOUNTER — Telehealth: Payer: Self-pay | Admitting: Internal Medicine

## 2020-02-18 ENCOUNTER — Other Ambulatory Visit: Payer: Self-pay | Admitting: Internal Medicine

## 2020-02-18 DIAGNOSIS — R253 Fasciculation: Secondary | ICD-10-CM

## 2020-02-18 DIAGNOSIS — E876 Hypokalemia: Secondary | ICD-10-CM

## 2020-02-18 DIAGNOSIS — R748 Abnormal levels of other serum enzymes: Secondary | ICD-10-CM

## 2020-02-18 NOTE — Telephone Encounter (Signed)
Muscle twitching   1 please schedule lab visit ASAP to repeat liver enzymes, potassium and magnesium   If this is cramps she can try theraworx topical magnesium spray over the counter or taking magnesium oxide 250 to 400 mg daily   Increase in Effexor could have side effect too so if labs normal this could be cause and we will have to reduce the dose back down   If needs appt before 05/27/20 call to schedule   [place]

## 2020-02-18 NOTE — Telephone Encounter (Signed)
Left message to return call. Also sending mychart message.

## 2020-02-25 NOTE — Telephone Encounter (Signed)
Pt has been scheduled for labs 5/13 at 10:00 am.  Patient states the twitching has stopped as she did not realize that the type of water she was buying had caffeine in it. She also states that she had some alcohol too.

## 2020-02-28 ENCOUNTER — Other Ambulatory Visit: Payer: Self-pay

## 2020-02-28 ENCOUNTER — Other Ambulatory Visit (INDEPENDENT_AMBULATORY_CARE_PROVIDER_SITE_OTHER): Payer: 59

## 2020-02-28 DIAGNOSIS — E876 Hypokalemia: Secondary | ICD-10-CM | POA: Diagnosis not present

## 2020-02-28 DIAGNOSIS — R253 Fasciculation: Secondary | ICD-10-CM | POA: Diagnosis not present

## 2020-02-28 DIAGNOSIS — R748 Abnormal levels of other serum enzymes: Secondary | ICD-10-CM

## 2020-02-28 LAB — COMPREHENSIVE METABOLIC PANEL
ALT: 33 U/L (ref 0–35)
AST: 32 U/L (ref 0–37)
Albumin: 3.9 g/dL (ref 3.5–5.2)
Alkaline Phosphatase: 60 U/L (ref 39–117)
BUN: 13 mg/dL (ref 6–23)
CO2: 28 mEq/L (ref 19–32)
Calcium: 9 mg/dL (ref 8.4–10.5)
Chloride: 103 mEq/L (ref 96–112)
Creatinine, Ser: 0.74 mg/dL (ref 0.40–1.20)
GFR: 102.82 mL/min (ref >60.00)
Glucose, Bld: 91 mg/dL (ref 70–99)
Potassium: 3.7 mEq/L (ref 3.5–5.1)
Sodium: 137 mEq/L (ref 135–145)
Total Bilirubin: 0.3 mg/dL (ref 0.2–1.2)
Total Protein: 6.6 g/dL (ref 6.0–8.3)

## 2020-02-28 LAB — MAGNESIUM: Magnesium: 2 mg/dL (ref 1.5–2.5)

## 2020-03-26 ENCOUNTER — Other Ambulatory Visit: Payer: Self-pay | Admitting: Podiatry

## 2020-03-28 ENCOUNTER — Encounter: Payer: Self-pay | Admitting: Internal Medicine

## 2020-04-08 ENCOUNTER — Other Ambulatory Visit: Payer: Self-pay | Admitting: Internal Medicine

## 2020-04-08 ENCOUNTER — Telehealth: Payer: Self-pay | Admitting: Obstetrics and Gynecology

## 2020-04-08 NOTE — Telephone Encounter (Signed)
Pt called in and stated that she thinks she might be pregnant the pt is having surgery on her ankle next month. The pt stated she keeps having a dream that she is pregnant and the doctor that is doing her surgery is going to not do her surgery due to that. The pt stated all of her other kids have had false positive pregnancy test. The pt stated she is on a diet pill. The pt stated lower pelvic pain, as well as some nausea. The pt is requesting a call back. Please advise

## 2020-04-09 MED ORDER — PHENTERMINE HCL 37.5 MG PO TABS: 37.5000 mg | ORAL_TABLET | Freq: Every day | ORAL | 0 refills | Status: DC

## 2020-04-10 NOTE — Telephone Encounter (Signed)
Spoke with pt concerning call to the office. Pt stated that she is unsure of when her last cycle was but was having pregnancy symptoms. Pt stated that every pregnancy she had was neg/pos preg test. Pt had a tubal ligation done last child is 45 years old. Pt would to be seen to discuss her issues and to do a blood pregnancy test.

## 2020-04-23 ENCOUNTER — Ambulatory Visit: Payer: 59 | Admitting: Obstetrics and Gynecology

## 2020-04-23 ENCOUNTER — Encounter: Payer: Self-pay | Admitting: Obstetrics and Gynecology

## 2020-04-23 ENCOUNTER — Other Ambulatory Visit: Payer: Self-pay

## 2020-04-23 VITALS — BP 142/94 | HR 102 | Ht 63.0 in | Wt 243.5 lb

## 2020-04-23 DIAGNOSIS — N951 Menopausal and female climacteric states: Secondary | ICD-10-CM | POA: Diagnosis not present

## 2020-04-23 DIAGNOSIS — Z9889 Other specified postprocedural states: Secondary | ICD-10-CM

## 2020-04-23 DIAGNOSIS — N926 Irregular menstruation, unspecified: Secondary | ICD-10-CM | POA: Diagnosis not present

## 2020-04-23 DIAGNOSIS — Z9851 Tubal ligation status: Secondary | ICD-10-CM

## 2020-04-23 LAB — POCT URINE PREGNANCY: Preg Test, Ur: NEGATIVE

## 2020-04-23 NOTE — Progress Notes (Signed)
Pt is having pregnancy sxs with a tubal ligation. Pt's LMP unknown. UPt-Neg. Pt stated that all of her pregnancies she had neg urine pregnancy test and always had to do a blood pregnancy test. Pt is requesting an blood urine test.

## 2020-04-23 NOTE — Progress Notes (Signed)
    GYNECOLOGY PROGRESS NOTE  Subjective:    Patient ID: Reatha Harps, female    DOB: 1976/06/16, 44 y.o.   MRN: [id number]  HPI  Patient is a 44 y.o. [phone] female who presents for complaints of "pregnancy symptoms" for ~ 1.5 months.  Notes increased appetite, fatigue, breast tenderness. Also having weird dreams about being pregnant.  Last cycle was 4-5 months ago.  She is currently sexually active. Has had a history of an endometrial ablation and tubal ligation.  Did not take a pregnancy test at home as she has always had false negatives in the past and was actually pregnant. Last menstrual cycle was ~ 4-5 months ago.   The following portions of the patient's history were reviewed and updated as appropriate: allergies, current medications, past family history, past medical history, past social history, past surgical history and problem list.  Review of Systems Pertinent items noted in HPI and remainder of comprehensive ROS otherwise negative.   Objective:   Blood pressure (!) 142/94, pulse (!) 102, height 5\' 3"  (1.6 m), weight 243 lb 8 oz (110.5 kg). General appearance: alert and no distress Remainder of exam deferred.   Assessment:   Irregular menses History of BTL History of endometrial ablation Perimenopausal   Plan:   -Patient with complaints of pregnancy-like symptoms, pregnancy test was negative today.  Discussed that patient may actually be closer towards menopause and may be experiencing perimenopausal symptoms.  Will order hormone levels, including hCG as patient notes a history of false negative urine pregnancy tests in the past.  Discussed the pregnancy was highly unlikely in light of patient's prior surgical history of endometrial ablation and tubal ligation.  - Hypertension, BPs elevated today, patient states she forgot to take her meds this morning.  Encouraged to take BP meds once home.   Rubie Maid, MD [facility]

## 2020-04-23 NOTE — Patient Instructions (Signed)

## 2020-04-24 LAB — FSH/LH
FSH: 2.9 m[IU]/mL
LH: 3.5 m[IU]/mL

## 2020-04-24 LAB — ESTRADIOL: Estradiol: 111 pg/mL

## 2020-04-24 LAB — BETA HCG QUANT (REF LAB): hCG Quant: <1 m[IU]/mL

## 2020-04-24 LAB — PROGESTERONE: Progesterone: 3.8 ng/mL

## 2020-04-30 ENCOUNTER — Other Ambulatory Visit: Payer: Self-pay

## 2020-04-30 ENCOUNTER — Encounter
Admission: RE | Admit: 2020-04-30 | Discharge: 2020-04-30 | Disposition: A | Discharge status: Home / Self Care | Payer: 59 | Source: Ambulatory Visit | Attending: Podiatry | Admitting: Podiatry

## 2020-04-30 HISTORY — DX: Anxiety disorder, unspecified: F41.9

## 2020-04-30 NOTE — Patient Instructions (Signed)
COVID TESTING Date: May 07, 2020 Testing site:  [facility] [address] Thru Hours:  6:19 am - 1:00 pm Once you are tested, you are asked to stay quarantined (avoiding public places) until after your surgery.   Your procedure is scheduled on: Friday May 09, 2020 Report to Day Surgery on the 2nd floor of the [facility]. To find out your arrival time, please call [phone] between 1PM - 3PM on: Thursday May 08, 2020  REMEMBER: Instructions that are not followed completely may result in serious medical risk, up to and including death; or upon the discretion of your surgeon and anesthesiologist your surgery may need to be rescheduled.  Do not eat food after midnight the night before surgery.  No gum chewing, lozengers or hard candies.  You may however, drink CLEAR liquids up to 2 hours before you are scheduled to arrive for your surgery. Do not drink anything within 2 hours of your scheduled arrival time.  Clear liquids include: - water  - apple juice without pulp - gatorade (not RED) - black coffee or tea (Do NOT add milk or creamers to the coffee or tea) Do NOT drink anything that is not on this list.  Type 1 and Type 2 diabetics should only drink water.  ENSURE PRE-SURGERY CARBOHYDRATE DRINK:  Complete drinking 2 hours prior to scheduled arrival time.  TAKE THESE MEDICATIONS THE MORNING OF SURGERY WITH A SIP OF WATER: AMLODIPINE CETIRIZINE LORAZEPAM EFFEXOR  Follow recommendations from Cardiologist, Pulmonologist or PCP regarding stopping Aspirin, Coumadin, Plavix, Eliquis, Pradaxa, or Pletal.  Stop Anti-inflammatories (NSAIDS) such as Advil, Aleve, Ibuprofen, Motrin, Naproxen, Naprosyn and Aspirin based products such as Excedrin, Goodys Powder, BC Powder. STOP ETODOLAC. (May take Tylenol or Acetaminophen if needed.)  Stop ANY OVER THE COUNTER supplements until after surgery. (May continue Vitamin D, Vitamin B, and  multivitamin.)  No Alcohol for 24 hours before or after surgery.  No Smoking including e-cigarettes for 24 hours prior to surgery.  No chewable tobacco products for at least 6 hours prior to surgery.  No nicotine patches on the day of surgery.  Do not use any "recreational" drugs for at least a week prior to your surgery.  Please be advised that the combination of cocaine and anesthesia may have negative outcomes, up to and including death. If you test positive for cocaine, your surgery will be cancelled.  On the morning of surgery brush your teeth with toothpaste and water, you may rinse your mouth with mouthwash if you wish. Do not swallow any toothpaste or mouthwash.  Do not wear jewelry, make-up, hairpins, clips or nail polish.  Do not wear lotions, powders, or perfumes.   Do not shave 48 hours prior to surgery.   Contact lenses, hearing aids and dentures may not be worn into surgery.  Do not bring valuables to the hospital. [facility] is not responsible for any missing/lost belongings or valuables.   Use CHG Soap as directed on instruction sheet.  Notify your doctor if there is any change in your medical condition (cold, fever, infection).  Wear comfortable clothing (specific to your surgery type) to the hospital.  Plan for stool softeners for home use; pain medications have a tendency to cause constipation. You can also help prevent constipation by eating foods high in fiber such as fruits and vegetables and drinking plenty of fluids as your diet allows.  After surgery, you can help prevent lung complications by doing breathing exercises.  Take  deep breaths and cough every 1-2 hours. Your doctor may order a device called an Incentive Spirometer to help you take deep breaths. When coughing or sneezing, hold a pillow firmly against your incision with both hands. This is called "splinting." Doing this helps protect your incision. It also decreases belly discomfort.  If you  are being discharged the day of surgery, you will not be allowed to drive home. You will need a responsible adult (18 years or older) to drive you home and stay with you that night.   If you are taking public transportation, you will need to have a responsible adult (18 years or older) with you. Please confirm with your physician that it is acceptable to use public transportation.   Please call the [facility] at [phone] if you have any questions about these instructions.  Visitation Policy:  Patients undergoing a surgery or procedure may have one family member or support person with them as long as that person is not COVID-19 positive or experiencing its symptoms.  That person may remain in the waiting area during the procedure.  Children under 84 years of age may have both parents or legal guardians with them during their procedure.  Inpatient Visitation Update:   Two designated support people may visit a patient during visiting hours 7 am to 8 pm. It must be the same two designated people for the duration of the patient stay. The visitors may come and go during the day, and there is no switching out to have different visitors. A mask must be worn at all times, including in the patient room.  Children under 36 years of age:  a total of 4 designated visitors for the child's entire stay are allowed. Only 2 in the room at a time and only one staying overnight at a time. The overnight guest can now rotate during the child's hospital stay.  As a reminder, masks are still required for all [place] team members, patients and visitors in all [facility] facilities.   Systemwide, no visitors 17 or younger.

## 2020-05-05 ENCOUNTER — Other Ambulatory Visit: Payer: Self-pay

## 2020-05-05 ENCOUNTER — Encounter
Admission: RE | Admit: 2020-05-05 | Discharge: 2020-05-05 | Disposition: A | Discharge status: Home / Self Care | Payer: 59 | Source: Ambulatory Visit | Attending: Podiatry | Admitting: Podiatry

## 2020-05-05 DIAGNOSIS — Z01818 Encounter for other preprocedural examination: Secondary | ICD-10-CM | POA: Diagnosis not present

## 2020-05-05 DIAGNOSIS — I10 Essential (primary) hypertension: Secondary | ICD-10-CM | POA: Insufficient documentation

## 2020-05-05 LAB — POTASSIUM: Potassium: 3.6 mmol/L (ref 3.5–5.1)

## 2020-05-07 ENCOUNTER — Other Ambulatory Visit
Admission: RE | Admit: 2020-05-07 | Discharge: 2020-05-07 | Disposition: A | Discharge status: Home / Self Care | Payer: 59 | Source: Ambulatory Visit | Attending: Podiatry | Admitting: Podiatry

## 2020-05-07 ENCOUNTER — Other Ambulatory Visit: Payer: Self-pay

## 2020-05-07 DIAGNOSIS — Z01812 Encounter for preprocedural laboratory examination: Secondary | ICD-10-CM | POA: Insufficient documentation

## 2020-05-07 DIAGNOSIS — Z20822 Contact with and (suspected) exposure to covid-19: Secondary | ICD-10-CM | POA: Insufficient documentation

## 2020-05-07 LAB — SARS CORONAVIRUS 2 (TAT 6-24 HRS): SARS Coronavirus 2: NEGATIVE

## 2020-05-09 ENCOUNTER — Other Ambulatory Visit: Payer: Self-pay

## 2020-05-09 ENCOUNTER — Ambulatory Visit: Payer: 59

## 2020-05-09 ENCOUNTER — Encounter: Payer: Self-pay | Admitting: Podiatry

## 2020-05-09 ENCOUNTER — Ambulatory Visit: Payer: 59 | Admitting: Urgent Care

## 2020-05-09 ENCOUNTER — Encounter
Admission: RE | Disposition: A | Discharge status: Home / Self Care | Payer: Self-pay | Source: Home / Self Care | Attending: Podiatry

## 2020-05-09 ENCOUNTER — Ambulatory Visit
Admission: RE | Admit: 2020-05-09 | Discharge: 2020-05-09 | Disposition: A | Discharge status: Home / Self Care | Payer: 59 | Attending: Podiatry | Admitting: Podiatry

## 2020-05-09 DIAGNOSIS — M2142 Flat foot [pes planus] (acquired), left foot: Secondary | ICD-10-CM | POA: Insufficient documentation

## 2020-05-09 DIAGNOSIS — F419 Anxiety disorder, unspecified: Secondary | ICD-10-CM | POA: Insufficient documentation

## 2020-05-09 DIAGNOSIS — M93272 Osteochondritis dissecans, left ankle and joints of left foot: Secondary | ICD-10-CM | POA: Diagnosis not present

## 2020-05-09 DIAGNOSIS — Z91040 Latex allergy status: Secondary | ICD-10-CM | POA: Insufficient documentation

## 2020-05-09 DIAGNOSIS — Z886 Allergy status to analgesic agent status: Secondary | ICD-10-CM | POA: Insufficient documentation

## 2020-05-09 DIAGNOSIS — F329 Major depressive disorder, single episode, unspecified: Secondary | ICD-10-CM | POA: Insufficient documentation

## 2020-05-09 DIAGNOSIS — Z9884 Bariatric surgery status: Secondary | ICD-10-CM | POA: Insufficient documentation

## 2020-05-09 DIAGNOSIS — Z880 Allergy status to penicillin: Secondary | ICD-10-CM | POA: Diagnosis not present

## 2020-05-09 DIAGNOSIS — I10 Essential (primary) hypertension: Secondary | ICD-10-CM | POA: Insufficient documentation

## 2020-05-09 DIAGNOSIS — M76822 Posterior tibial tendinitis, left leg: Secondary | ICD-10-CM | POA: Insufficient documentation

## 2020-05-09 DIAGNOSIS — Z6841 Body Mass Index (BMI) 40.0 and over, adult: Secondary | ICD-10-CM | POA: Diagnosis not present

## 2020-05-09 DIAGNOSIS — Z885 Allergy status to narcotic agent status: Secondary | ICD-10-CM | POA: Insufficient documentation

## 2020-05-09 DIAGNOSIS — M65872 Other synovitis and tenosynovitis, left ankle and foot: Secondary | ICD-10-CM | POA: Insufficient documentation

## 2020-05-09 HISTORY — PX: TENDON TRANSFER: SHX6109

## 2020-05-09 HISTORY — PX: OSTECTOMY: SHX6439

## 2020-05-09 HISTORY — PX: WOUND DEBRIDEMENT: SHX247

## 2020-05-09 HISTORY — PX: ANKLE ARTHROSCOPY: SHX545

## 2020-05-09 LAB — POCT PREGNANCY, URINE: Preg Test, Ur: NEGATIVE

## 2020-05-09 SURGERY — ARTHROSCOPY, ANKLE
Anesthesia: General | Site: Foot | Laterality: Left

## 2020-05-09 MED ORDER — CLINDAMYCIN PHOSPHATE 900 MG/50ML IV SOLN
900.0000 mg | INTRAVENOUS | Status: AC
  Administered 2020-05-09: 900 mg via INTRAVENOUS

## 2020-05-09 MED ORDER — MIDAZOLAM HCL 2 MG/2ML IJ SOLN
INTRAMUSCULAR | Status: AC
  Filled 2020-05-09: qty 2

## 2020-05-09 MED ORDER — ACETAMINOPHEN 10 MG/ML IV SOLN: 1000.0000 mg | Freq: Once | INTRAVENOUS | Status: DC | PRN

## 2020-05-09 MED ORDER — HYDROCODONE-ACETAMINOPHEN 5-325 MG PO TABS: 1.0000 | ORAL_TABLET | Freq: Four times a day (QID) | ORAL | 0 refills | Status: DC | PRN

## 2020-05-09 MED ORDER — ASPIRIN 81 MG PO TBEC
81.0000 mg | DELAYED_RELEASE_TABLET | Freq: Every day | ORAL | 1 refills | Status: DC
Start: 2020-05-09 — End: 2020-11-20

## 2020-05-09 MED ORDER — FENTANYL CITRATE (PF) 100 MCG/2ML IJ SOLN
INTRAMUSCULAR | Status: AC
  Filled 2020-05-09: qty 2

## 2020-05-09 MED ORDER — CLINDAMYCIN PHOSPHATE 900 MG/50ML IV SOLN
INTRAVENOUS | Status: AC
  Filled 2020-05-09: qty 50

## 2020-05-09 MED ORDER — KETOROLAC TROMETHAMINE 30 MG/ML IJ SOLN
INTRAMUSCULAR | Status: AC
  Filled 2020-05-09: qty 1

## 2020-05-09 MED ORDER — POVIDONE-IODINE 7.5 % EX SOLN
Freq: Once | CUTANEOUS | Status: DC
  Filled 2020-05-09: qty 118

## 2020-05-09 MED ORDER — ACETAMINOPHEN 10 MG/ML IV SOLN
INTRAVENOUS | Status: AC
  Filled 2020-05-09: qty 100

## 2020-05-09 MED ORDER — CHLORHEXIDINE GLUCONATE 0.12 % MT SOLN: 15.0000 mL | Freq: Once | OROMUCOSAL | Status: AC

## 2020-05-09 MED ORDER — KETOROLAC TROMETHAMINE 30 MG/ML IJ SOLN
INTRAMUSCULAR | Status: DC | PRN
  Administered 2020-05-09: 30 mg via INTRAVENOUS

## 2020-05-09 MED ORDER — BUPIVACAINE HCL (PF) 0.25 % IJ SOLN
INTRAMUSCULAR | Status: AC
  Filled 2020-05-09: qty 30

## 2020-05-09 MED ORDER — DEXAMETHASONE SODIUM PHOSPHATE 10 MG/ML IJ SOLN
INTRAMUSCULAR | Status: AC
  Filled 2020-05-09: qty 1

## 2020-05-09 MED ORDER — HYDROCODONE-ACETAMINOPHEN 5-325 MG PO TABS
ORAL_TABLET | ORAL | Status: AC
  Filled 2020-05-09: qty 1

## 2020-05-09 MED ORDER — MIDAZOLAM HCL 2 MG/2ML IJ SOLN
INTRAMUSCULAR | Status: DC | PRN
  Administered 2020-05-09: 2 mg via INTRAVENOUS

## 2020-05-09 MED ORDER — PROPOFOL 10 MG/ML IV BOLUS
INTRAVENOUS | Status: DC | PRN
  Administered 2020-05-09: 200 mg via INTRAVENOUS

## 2020-05-09 MED ORDER — LIDOCAINE HCL (CARDIAC) PF 100 MG/5ML IV SOSY
PREFILLED_SYRINGE | INTRAVENOUS | Status: DC | PRN
  Administered 2020-05-09: 100 mg via INTRAVENOUS

## 2020-05-09 MED ORDER — ONDANSETRON HCL 4 MG/2ML IJ SOLN
INTRAMUSCULAR | Status: AC
  Filled 2020-05-09: qty 2

## 2020-05-09 MED ORDER — FENTANYL CITRATE (PF) 100 MCG/2ML IJ SOLN
INTRAMUSCULAR | Status: AC
  Administered 2020-05-09: 25 ug via INTRAVENOUS
  Filled 2020-05-09: qty 2

## 2020-05-09 MED ORDER — ROCURONIUM BROMIDE 100 MG/10ML IV SOLN
INTRAVENOUS | Status: DC | PRN
  Administered 2020-05-09 (×2): 50 mg via INTRAVENOUS

## 2020-05-09 MED ORDER — LIDOCAINE-EPINEPHRINE (PF) 1 %-1:200000 IJ SOLN
INTRAMUSCULAR | Status: AC
  Filled 2020-05-09: qty 30

## 2020-05-09 MED ORDER — FENTANYL CITRATE (PF) 100 MCG/2ML IJ SOLN
INTRAMUSCULAR | Status: DC | PRN
  Administered 2020-05-09: 50 ug via INTRAVENOUS
  Administered 2020-05-09 (×2): 100 ug via INTRAVENOUS
  Administered 2020-05-09 (×2): 50 ug via INTRAVENOUS

## 2020-05-09 MED ORDER — ONDANSETRON HCL 4 MG/2ML IJ SOLN
INTRAMUSCULAR | Status: DC | PRN
  Administered 2020-05-09: 4 mg via INTRAVENOUS

## 2020-05-09 MED ORDER — ACETAMINOPHEN 10 MG/ML IV SOLN
INTRAVENOUS | Status: DC | PRN
  Administered 2020-05-09: 1000 mg via INTRAVENOUS

## 2020-05-09 MED ORDER — FENTANYL CITRATE (PF) 250 MCG/5ML IJ SOLN
INTRAMUSCULAR | Status: AC
  Filled 2020-05-09: qty 5

## 2020-05-09 MED ORDER — PROPOFOL 10 MG/ML IV BOLUS
INTRAVENOUS | Status: AC
  Filled 2020-05-09: qty 20

## 2020-05-09 MED ORDER — LIDOCAINE HCL (PF) 2 % IJ SOLN
INTRAMUSCULAR | Status: AC
  Filled 2020-05-09: qty 5

## 2020-05-09 MED ORDER — ONDANSETRON HCL 4 MG/2ML IJ SOLN: 4.0000 mg | Freq: Once | INTRAMUSCULAR | Status: DC | PRN

## 2020-05-09 MED ORDER — BUPIVACAINE LIPOSOME 1.3 % IJ SUSP
INTRAMUSCULAR | Status: AC
  Filled 2020-05-09: qty 20

## 2020-05-09 MED ORDER — LACTATED RINGERS IV SOLN
INTRAVENOUS | Status: DC
  Administered 2020-05-09: 10 mL/h via INTRAVENOUS

## 2020-05-09 MED ORDER — BUPIVACAINE HCL (PF) 0.25 % IJ SOLN
INTRAMUSCULAR | Status: DC | PRN
Start: 2020-05-09 — End: 2020-05-09
  Administered 2020-05-09 (×2): 10 mL

## 2020-05-09 MED ORDER — PHENYLEPHRINE HCL (PRESSORS) 10 MG/ML IV SOLN
INTRAVENOUS | Status: DC | PRN
  Administered 2020-05-09: 100 ug via INTRAVENOUS

## 2020-05-09 MED ORDER — ORAL CARE MOUTH RINSE: 15.0000 mL | Freq: Once | OROMUCOSAL | Status: AC

## 2020-05-09 MED ORDER — DEXAMETHASONE SODIUM PHOSPHATE 10 MG/ML IJ SOLN
INTRAMUSCULAR | Status: DC | PRN
  Administered 2020-05-09: 10 mg via INTRAVENOUS

## 2020-05-09 MED ORDER — SUGAMMADEX SODIUM 500 MG/5ML IV SOLN
INTRAVENOUS | Status: DC | PRN
  Administered 2020-05-09: 250 mg via INTRAVENOUS

## 2020-05-09 MED ORDER — BUPIVACAINE LIPOSOME 1.3 % IJ SUSP
INTRAMUSCULAR | Status: DC | PRN
  Administered 2020-05-09 (×2): 10 mL

## 2020-05-09 MED ORDER — ACETAMINOPHEN 10 MG/ML IV SOLN
INTRAVENOUS | Status: AC
  Administered 2020-05-09: 1000 mg via INTRAVENOUS
  Filled 2020-05-09: qty 100

## 2020-05-09 MED ORDER — SUCCINYLCHOLINE CHLORIDE 200 MG/10ML IV SOSY
PREFILLED_SYRINGE | INTRAVENOUS | Status: AC
  Filled 2020-05-09: qty 10

## 2020-05-09 MED ORDER — CHLORHEXIDINE GLUCONATE 0.12 % MT SOLN
OROMUCOSAL | Status: AC
  Administered 2020-05-09: 15 mL via OROMUCOSAL
  Filled 2020-05-09: qty 15

## 2020-05-09 MED ORDER — ROCURONIUM BROMIDE 10 MG/ML (PF) SYRINGE
PREFILLED_SYRINGE | INTRAVENOUS | Status: AC
  Filled 2020-05-09: qty 10

## 2020-05-09 MED ORDER — SUCCINYLCHOLINE CHLORIDE 20 MG/ML IJ SOLN
INTRAMUSCULAR | Status: DC | PRN
  Administered 2020-05-09: 140 mg via INTRAVENOUS

## 2020-05-09 MED ORDER — FENTANYL CITRATE (PF) 100 MCG/2ML IJ SOLN
25.0000 ug | INTRAMUSCULAR | Status: AC | PRN
  Administered 2020-05-09 (×4): 25 ug via INTRAVENOUS

## 2020-05-09 MED ORDER — HYDROCODONE-ACETAMINOPHEN 5-325 MG PO TABS
1.0000 | ORAL_TABLET | Freq: Once | ORAL | Status: AC
  Administered 2020-05-09: 1 via ORAL

## 2020-05-09 SURGICAL SUPPLY — 141 items
ADAPTER IRRIG TUBE 2 SPIKE SOL (ADAPTER) ×6 IMPLANT
ADPR TBG 2 SPK PMP STRL ASCP (ADAPTER) ×4
ANCH SUT 4.5 FTPRNT PEEK-OPTM (Anchor) ×2 IMPLANT
ANCHOR 4.5 FOOTPRINT ULTRA (Anchor) ×3 IMPLANT
APL SKNCLS STERI-STRIP NONHPOA (GAUZE/BANDAGES/DRESSINGS) ×2
ARTHROWAND PARAGON T2 (SURGICAL WAND) ×3
BENZOIN TINCTURE PRP APPL 2/3 (GAUZE/BANDAGES/DRESSINGS) ×3 IMPLANT
BLADE FULL RADIUS 2.9 (BLADE) ×3 IMPLANT
BLADE OSC/SAGITTAL MD 5.5X18 (BLADE) ×3 IMPLANT
BLADE OSC/SAGITTAL MD 9X18.5 (BLADE) ×3 IMPLANT
BLADE OSCILLATING/SAGITTAL (BLADE) ×3
BLADE SURG 15 STRL LF DISP TIS (BLADE) ×8 IMPLANT
BLADE SURG 15 STRL SS (BLADE) ×12
BLADE SURG MINI STRL (BLADE) ×3 IMPLANT
BLADE SW THK.38XMED LNG THN (BLADE) ×2 IMPLANT
BNDG CMPR STD VLCR NS LF 5.8X4 (GAUZE/BANDAGES/DRESSINGS) ×4
BNDG COHESIVE 4X5 TAN STRL (GAUZE/BANDAGES/DRESSINGS) ×3 IMPLANT
BNDG COHESIVE 6X5 TAN STRL LF (GAUZE/BANDAGES/DRESSINGS) ×3 IMPLANT
BNDG CONFORM 2 STRL LF (GAUZE/BANDAGES/DRESSINGS) ×3 IMPLANT
BNDG CONFORM 3 STRL LF (GAUZE/BANDAGES/DRESSINGS) ×6 IMPLANT
BNDG ELASTIC 4X5.8 VLCR NS LF (GAUZE/BANDAGES/DRESSINGS) ×6 IMPLANT
BNDG ELASTIC 4X5.8 VLCR STR LF (GAUZE/BANDAGES/DRESSINGS) ×3 IMPLANT
BNDG ESMARK 4X12 TAN STRL LF (GAUZE/BANDAGES/DRESSINGS) ×3 IMPLANT
BNDG GAUZE 4.5X4.1 6PLY STRL (MISCELLANEOUS) ×9 IMPLANT
BOOT STEPPER DURA MED (SOFTGOODS) ×3 IMPLANT
BUR AGGRESSIVE+ 2.5 (BURR) IMPLANT
CANISTER SUCT 1200ML W/VALVE (MISCELLANEOUS) ×3 IMPLANT
CANISTER SUCT 3000ML PPV (MISCELLANEOUS) ×3 IMPLANT
COVER WAND RF STERILE (DRAPES) ×3 IMPLANT
CUFF TOURN SGL QUICK 12 (TOURNIQUET CUFF) IMPLANT
CUFF TOURN SGL QUICK 18X4 (TOURNIQUET CUFF) ×3 IMPLANT
CUFF TOURN SGL QUICK 24 (TOURNIQUET CUFF) ×3
CUFF TRNQT CYL 24X4X16.5-23 (TOURNIQUET CUFF) ×2 IMPLANT
DRAPE FLUOR MINI C-ARM 54X84 (DRAPES) ×6 IMPLANT
DRAPE IMP U-DRAPE 54X76 (DRAPES) ×3 IMPLANT
DURAPREP 26ML APPLICATOR (WOUND CARE) ×3 IMPLANT
ELECT REM PT RETURN 9FT ADLT (ELECTROSURGICAL) ×3
ELECTRODE REM PT RTRN 9FT ADLT (ELECTROSURGICAL) ×2 IMPLANT
ETHIBOND 2 0 GREEN CT 2 30IN (SUTURE) ×3 IMPLANT
GAUZE PACKING 1/4 X5 YD (GAUZE/BANDAGES/DRESSINGS) ×3 IMPLANT
GAUZE PACKING IODOFORM 1X5 (PACKING) ×3 IMPLANT
GAUZE SPONGE 4X4 12PLY STRL (GAUZE/BANDAGES/DRESSINGS) ×6 IMPLANT
GAUZE XEROFORM 1X8 LF (GAUZE/BANDAGES/DRESSINGS) ×6 IMPLANT
GLOVE BIO SURGEON STRL SZ7.5 (GLOVE) ×3 IMPLANT
GLOVE BIOGEL PI IND STRL 8 (GLOVE) ×2 IMPLANT
GLOVE BIOGEL PI INDICATOR 8 (GLOVE) ×1
GLOVE INDICATOR 8.0 STRL GRN (GLOVE) ×3 IMPLANT
GOWN STRL REUS W/ TWL XL LVL3 (GOWN DISPOSABLE) ×4 IMPLANT
GOWN STRL REUS W/TWL XL LVL3 (GOWN DISPOSABLE) ×6
GRADUATE 1200CC STRL 31836 (MISCELLANEOUS) ×3 IMPLANT
HANDLE YANKAUER SUCT BULB TIP (MISCELLANEOUS) ×3 IMPLANT
HANDPIECE VERSAJET DEBRIDEMENT (MISCELLANEOUS) ×3 IMPLANT
IV LACTATED RINGER IRRG 3000ML (IV SOLUTION) ×15
IV LR IRRIG 3000ML ARTHROMATIC (IV SOLUTION) ×10 IMPLANT
IV NS 1000ML (IV SOLUTION) ×3
IV NS 1000ML BAXH (IV SOLUTION) ×2 IMPLANT
K-WIRE SMOOTH 2.3X150 SGL TIP (WIRE) ×6
KIT SUTURE 1.8 Q-FIX DISP (KITS) ×3 IMPLANT
KIT TURNOVER KIT A (KITS) ×3 IMPLANT
KWIRE SMOOTH 2.3X150 SGL TIP (WIRE) ×4 IMPLANT
LABEL OR SOLS (LABEL) ×3 IMPLANT
MANIFOLD NEPTUNE II (INSTRUMENTS) ×3 IMPLANT
NDL HPO THNWL 1X22GA REG BVL (NEEDLE) ×2 IMPLANT
NDL MAYO CATGUT SZ5 (NEEDLE)
NDL SAFETY ECLIPSE 18X1.5 (NEEDLE) ×2 IMPLANT
NDL SUT 5 .5 CRC TPR PNT MAYO (NEEDLE) IMPLANT
NEEDLE FILTER BLUNT 18X 1/2SAF (NEEDLE) ×1
NEEDLE FILTER BLUNT 18X1 1/2 (NEEDLE) ×2 IMPLANT
NEEDLE HYPO 18GX1.5 SHARP (NEEDLE) ×3
NEEDLE HYPO 25X1 1.5 SAFETY (NEEDLE) ×9 IMPLANT
NEEDLE SAFETY 22GX1 (NEEDLE) ×3
NS IRRIG 500ML POUR BTL (IV SOLUTION) ×3 IMPLANT
PACK EXTREMITY (MISCELLANEOUS) ×3 IMPLANT
PACK KNEE ARTHRO (MISCELLANEOUS) ×3 IMPLANT
PAD ABD DERMACEA PRESS 5X9 (GAUZE/BANDAGES/DRESSINGS) ×3 IMPLANT
PAD CAST CTTN 4X4 STRL (SOFTGOODS) ×4 IMPLANT
PAD PREP 24X41 OB/GYN DISP (PERSONAL CARE ITEMS) ×3 IMPLANT
PADDING CAST COTTON 4X4 STRL (SOFTGOODS) ×6
PENCIL ELECTRO HAND CTR (MISCELLANEOUS) ×3 IMPLANT
PULSAVAC PLUS IRRIG FAN TIP (DISPOSABLE) ×3
RASP SM TEAR CROSS CUT (RASP) ×3 IMPLANT
SET TUBE SUCT SHAVER OUTFL 24K (TUBING) ×3 IMPLANT
SET TUBE TIP INTRA-ARTICULAR (MISCELLANEOUS) ×3 IMPLANT
SHIELD FULL FACE ANTIFOG 7M (MISCELLANEOUS) ×3 IMPLANT
SOL .9 NS 3000ML IRR  AL (IV SOLUTION) ×9
SOL .9 NS 3000ML IRR AL (IV SOLUTION) ×6
SOL .9 NS 3000ML IRR UROMATIC (IV SOLUTION) ×6 IMPLANT
SOL PREP PVP 2OZ (MISCELLANEOUS) ×3
SOLUTION PREP PVP 2OZ (MISCELLANEOUS) ×2 IMPLANT
SPLINT CAST 1 STEP 4X30 (MISCELLANEOUS) IMPLANT
SPLINT CAST 1 STEP 5X30 WHT (MISCELLANEOUS) ×3 IMPLANT
SPLINT FAST PLASTER 5X30 (CAST SUPPLIES) ×1
SPLINT PLASTER CAST FAST 5X30 (CAST SUPPLIES) ×2 IMPLANT
SPONGE LAP 18X18 RF (DISPOSABLE) ×3 IMPLANT
STAPLER SKIN PROX 35W (STAPLE) ×3 IMPLANT
STOCKINETTE IMPERVIOUS 9X36 MD (GAUZE/BANDAGES/DRESSINGS) ×3 IMPLANT
STOCKINETTE M/LG 89821 (MISCELLANEOUS) ×3 IMPLANT
STRAP ANKLE DISTRACTOR (MISCELLANEOUS) IMPLANT
STRAP SAFETY 5IN WIDE (MISCELLANEOUS) ×3 IMPLANT
STRIP CLOSURE SKIN 1/2X4 (GAUZE/BANDAGES/DRESSINGS) ×3 IMPLANT
STRIP CLOSURE SKIN 1/4X4 (GAUZE/BANDAGES/DRESSINGS) ×6 IMPLANT
SUCTION FRAZIER HANDLE 10FR (MISCELLANEOUS) ×3
SUCTION TUBE FRAZIER 10FR DISP (MISCELLANEOUS) ×2 IMPLANT
SUT ETH BLK MONO 3 0 FS 1 12/B (SUTURE) ×3 IMPLANT
SUT ETHIBOND GREEN BRAID 0S 4 (SUTURE) ×12 IMPLANT
SUT ETHILON 2 0 FS 18 (SUTURE) ×6 IMPLANT
SUT ETHILON 4-0 (SUTURE) ×3
SUT ETHILON 4-0 FS2 18XMFL BLK (SUTURE) ×2
SUT MNCRL 4-0 (SUTURE) ×6
SUT MNCRL 4-0 27XMFL (SUTURE) ×4
SUT MNCRL+ 5-0 VIOLET P-3 (SUTURE) ×2 IMPLANT
SUT MONOCRYL 5-0 (SUTURE) ×3
SUT PDS II 3-0 (SUTURE) ×3 IMPLANT
SUT VIC AB 0 SH 27 (SUTURE) ×3 IMPLANT
SUT VIC AB 2-0 CT1 27 (SUTURE) ×3
SUT VIC AB 2-0 CT1 TAPERPNT 27 (SUTURE) ×2 IMPLANT
SUT VIC AB 2-0 SH 27 (SUTURE) ×6
SUT VIC AB 2-0 SH 27XBRD (SUTURE) ×4 IMPLANT
SUT VIC AB 3-0 SH 27 (SUTURE) ×6
SUT VIC AB 3-0 SH 27X BRD (SUTURE) ×4 IMPLANT
SUT VIC AB 4-0 FS2 27 (SUTURE) ×3 IMPLANT
SUT VIC AB 4-0 SH 27 (SUTURE) ×9
SUT VIC AB 4-0 SH 27XANBCTRL (SUTURE) ×6 IMPLANT
SUT VICRYL AB 3-0 FS1 BRD 27IN (SUTURE) ×3 IMPLANT
SUTURE ETHLN 4-0 FS2 18XMF BLK (SUTURE) ×2 IMPLANT
SUTURE MNCRL 4-0 27XMF (SUTURE) ×4 IMPLANT
SWAB CULTURE AMIES ANAERIB BLU (MISCELLANEOUS) ×3 IMPLANT
SWABSTK COMLB BENZOIN TINCTURE (MISCELLANEOUS) ×3 IMPLANT
SYR 10ML LL (SYRINGE) ×6 IMPLANT
SYR 20ML LL LF (SYRINGE) ×3 IMPLANT
SYR 3ML LL SCALE MARK (SYRINGE) ×3 IMPLANT
SYR BULB EAR ULCER 3OZ GRN STR (SYRINGE) ×3 IMPLANT
TIP FAN IRRIG PULSAVAC PLUS (DISPOSABLE) ×2 IMPLANT
TUBING ARTHRO INFLOW-ONLY STRL (TUBING) ×3 IMPLANT
TUBING CONNECTING 10 (TUBING) ×3 IMPLANT
WAND ARTHRO PARAGON T2 (SURGICAL WAND) ×2 IMPLANT
WAND COVAC 50 IFS (MISCELLANEOUS) IMPLANT
WAND TOPAZ MICRO DEBRIDER (MISCELLANEOUS) ×3 IMPLANT
WEDGE BONE 20MMH X 22MMD 8MMT (Tissue) ×3 IMPLANT
WIRE MAGNUM (SUTURE) ×3 IMPLANT
WIRE Z .062 C-WIRE SPADE TIP (WIRE) IMPLANT

## 2020-05-09 NOTE — Anesthesia Preprocedure Evaluation (Addendum)
Anesthesia Evaluation  Patient identified by MRN, date of birth, ID band Patient awake    Reviewed: Allergy & Precautions, NPO status , Patient's Chart, lab work & pertinent test results  History of Anesthesia Complications Negative for: history of anesthetic complications  Airway Mallampati: II  TM Distance: >3 FB Neck ROM: Full    Dental no notable dental hx. (+) Teeth Intact   Pulmonary neg pulmonary ROS, neg sleep apnea, neg COPD, Patient abstained from smoking.Not current smoker,    Pulmonary exam normal breath sounds clear to auscultation       Cardiovascular Exercise Tolerance: Good METShypertension, Pt. on medications (-) CAD and (-) Past MI Normal cardiovascular exam(-) dysrhythmias  Rhythm:Regular Rate:Normal - Systolic murmurs    Neuro/Psych  Headaches, PSYCHIATRIC DISORDERS Anxiety Depression    GI/Hepatic Neg liver ROS, neg GERD  ,  Endo/Other  neg diabetesMorbid obesityS/p gastric sleeve  Renal/GU negative Renal ROS     Musculoskeletal negative musculoskeletal ROS (+)   Abdominal Normal abdominal exam  (+) + obese,   Peds negative pediatric ROS (+)  Hematology  (+) anemia ,   Anesthesia Other Findings Past Medical History: No date: Anemia     Comment:  has required 2 units PRBC No date: Anxiety No date: Chicken pox No date: Depression     Comment:  trazadone has not helped in the past  No date: Fatty liver No date: Headache 2009: History of blood transfusion     Comment:  [place] No date: Hypertension No date: MRSA (methicillin resistant Staphylococcus aureus) No date: Sinusitis No date: Sinusitis, chronic No date: UTI (urinary tract infection) No date: Vitamin D deficiency  Reproductive/Obstetrics                             Anesthesia Physical  Anesthesia Plan  ASA: III  Anesthesia Plan: General   Post-op Pain Management:    Induction:  Intravenous  PONV Risk Score and Plan: 3 and Midazolam, Dexamethasone and Ondansetron  Airway Management Planned: Oral ETT and Video Laryngoscope Planned  Additional Equipment: None  Intra-op Plan:   Post-operative Plan: Extubation in OR  Informed Consent: I have reviewed the patients History and Physical, chart, labs and discussed the procedure including the risks, benefits and alternatives for the proposed anesthesia with the patient or authorized representative who has indicated his/her understanding and acceptance.     Dental advisory given  Plan Discussed with: CRNA and Surgeon  Anesthesia Plan Comments: (Discussed risks of anesthesia with patient, including PONV, sore throat, lip/dental damage. Rare risks discussed as well, such as cardiorespiratory and neurological sequelae. Patient understands. Patient has listed allergy to PCN. Severe blistering skin reaction (SJS/TEN)? no Liver or kidney injury caused by PCN? no Hemolytic anemia from PCN? no Drug fever? no Painful swollen joints? no Severe reaction involving inside of mouth, eye, or genital ulcers? no Based on current evidence Alfonse Alpers et al, J Allergy Clin Immunol Pract, 2019), safe to proceed with cefazolin use: Yes   )       Anesthesia Quick Evaluation

## 2020-05-09 NOTE — Anesthesia Procedure Notes (Signed)
Performed by: Alexina Niccoli M, CRNA       

## 2020-05-09 NOTE — Transfer of Care (Signed)
Immediate Anesthesia Transfer of Care Note  Patient: Valerie Morales  Procedure(s) Performed: ANKLE ARTHROSCOPY OCD REPAIR LEFT (Left Ankle) A-SCOPE/DEBRIDEMENT/EXTENSIVE LEFT (Left Foot) FDL TRANSFER;DEEP LEFT (Left Foot) EVANS/MCDO LEFT (Left Foot)  Patient Location: PACU  Anesthesia Type:General  Level of Consciousness: awake and drowsy  Airway & Oxygen Therapy: Patient Spontanous Breathing and Patient connected to nasal cannula oxygen  Post-op Assessment: Report given to RN and Post -op Vital signs reviewed and stable  Post vital signs: Reviewed and stable  Last Vitals:  Vitals Value Taken Time  BP 120/67 05/09/20 1022  Temp 36 C 05/09/20 1022  Pulse 101 05/09/20 1030  Resp 17 05/09/20 1030  SpO2 94 % 05/09/20 1030  Vitals shown include unvalidated device data.  Last Pain:  Vitals:   05/09/20 1022  TempSrc:   PainSc: 0-No pain      Patients Stated Pain Goal: 1 (76/54/65 0354)  Complications: No complications documented.

## 2020-05-09 NOTE — Anesthesia Postprocedure Evaluation (Signed)
Anesthesia Post Note  Patient: Valerie Morales  Procedure(s) Performed: ANKLE ARTHROSCOPY OCD REPAIR LEFT (Left Ankle) A-SCOPE/DEBRIDEMENT/EXTENSIVE LEFT (Left Foot) FDL TRANSFER;DEEP LEFT (Left Foot) EVANS/MCDO LEFT (Left Foot)  Patient location during evaluation: PACU Anesthesia Type: General Level of consciousness: awake and alert Pain management: pain level controlled Vital Signs Assessment: post-procedure vital signs reviewed and stable Respiratory status: spontaneous breathing, nonlabored ventilation, respiratory function stable and patient connected to nasal cannula oxygen Cardiovascular status: blood pressure returned to baseline and stable Postop Assessment: no apparent nausea or vomiting Anesthetic complications: no   No complications documented.   Last Vitals:  Vitals:   05/09/20 1135 05/09/20 1153  BP:  118/72  Pulse: 102 (!) 107  Resp: 14 18  Temp:  (!) 36.3 C  SpO2: 94% 99%    Last Pain:  Vitals:   05/09/20 1153  TempSrc: Tympanic  PainSc: 2                  Arita Miss

## 2020-05-09 NOTE — Anesthesia Procedure Notes (Signed)
Procedure Name: Intubation Date/Time: 05/09/2020 7:36 AM Performed by: Genevie Ann, CRNA Pre-anesthesia Checklist: Patient identified, Emergency Drugs available, Suction available, Patient being monitored and Timeout performed Patient Re-evaluated:Patient Re-evaluated prior to induction Oxygen Delivery Method: Circle system utilized Preoxygenation: Pre-oxygenation with 100% oxygen Induction Type: IV induction Ventilation: Mask ventilation without difficulty Laryngoscope Size: Mac and 3 Grade View: Grade I Tube type: Oral Tube size: 7.0 mm Number of attempts: 1 Airway Equipment and Method: Stylet Secured at: 21 cm Tube secured with: Tape

## 2020-05-09 NOTE — Op Note (Signed)
Operative note   Surgeon:Aalyssa Elderkin Lawyer: None    Preop diagnosis: 1.  Ankle synovitis with osteochondral lesion left ankle 2.  Posterior tibial tendinitis with dysfunction 3.  Calcaneal valgus left with pes planus    Postop diagnosis: Same    Procedure: 1.  Ankle arthroscopy with moderate debridement of inflammatory tissue and repair of osteochondral defect 2.  Evans calcaneal osteotomy left heel 3.  Posterior tibial tendon repair with flexor digitorum longus transfer    EBL: Minimal    Anesthesia:local and general.  Local consisted of a total of 20 cc of 0.25% bupivacaine and 20 cc of Exparel long-acting anesthetic infiltrated along all surgical sites    Hemostasis: Mid calf tourniquet inflated to 200 mmHg for 118 minutes    Specimen: None    Complications: None    Operative indications:Valerie Morales is an 44 y.o. that presents today for surgical intervention.  The risks/benefits/alternatives/complications have been discussed and consent has been given.    Procedure:  Patient was brought into the OR and placed on the operating table in thesupine position. After anesthesia was obtained theleft lower extremity was prepped and draped in usual sterile fashion.  Attention was directed to the ankle initially where an anteromedial and anterolateral ankle portal was made.  The ankle joint was then entered with the small joint arthroscopy set.  Noted inflammatory tissue and synovitis to the antral aspect of the ankle joint was found.  The osteochondral lesion on the lateral shoulder of the talus was noted.  This was approximately 6 mm in diameter.  Small shaver was used to extensively debride all inflammatory tissue.  All nonviable tissue was removed at this time.  Attention was then directed to the osteochondral lesion where the frayed cartilage was excised initially with the shaver.  The edges were then curetted to good normal tissue.  A Paragon wand was used to smooth the  edges of the cartilage.  Next a 30 degree awl was used to perform microfracture.  Good removal of all inflammatory tissue was noted and the equipment was then removed.  Attention was then directed to the lateral aspect of the calcaneus where longitudinal incision was made course on the peroneal tendon regions.  Sharp and blunt dissection carried down to the peroneal tendons.  These were then retracted throughout the procedure.  The calcaneocuboid joint was noted.  Approximately 1 cm to 1-1/2 cm proximal to the calcaneocuboid joint a lateral to medial osteotomy was created.  The capital fragment was translocated distally.  An 8 mm Evans wedge graft was placed from the Paragon 28 Evans graft set.  Good realignment was noted and good fit was noted.  No instability was seen.  The wound was flushed with copious amounts of irrigation.  Closure was then performed with a 3-0 Vicryl the deeper layer 4-0 Vicryl the subcutaneous tissue and a 4-0 Monocryl for skin.  Attention was directed to the medial aspect of the ankle where course along the posterior tibial tendon a medial longitudinal incision was made from the medial malleolus to the level of the navicular tuberosity.  Sharp and blunt dissection was carried down to the tendon sheath.  This was then entered.  There was noted to be some synovitis and inflammatory fraying of the posterior tibial tendon at its insertion.  This was infiltrated with a Topaz wand at this time.  The flexor digitorum longus tendon was then dissected away.  This was cut deep to the navicular tuberosity.  Next a 5.0 mm footprint bone anchor was placed into the medial aspect of the navicular tuberosity.  The tendon was placed into the bone anchor and this was attached into the navicular tuberosity with the foot held in a plantarflexed and inverted position.  Good stability and alignment was noted.  No weakness of the tendon bone interface was noted.  This wound was then flushed with copious  amounts of irrigation.  Closure was then performed with a 3-0 Vicryl for the deeper layer, 4-0 Vicryl for the subcutaneous tissue and a 4-0 Monocryl for the skin.  The anterior portals were closed with a 3-0 nylon.  Patient was placed in a well compressive bulky sterile dressing and placed in a neutral position in an equalizer walker cam boot.    Patient tolerated the procedure and anesthesia well.  Was transported from the OR to the PACU with all vital signs stable and vascular status intact. To be discharged per routine protocol.  Will follow up in approximately 1 week in the outpatient clinic.  Prescription for Norco was sent to pharmacy.

## 2020-05-09 NOTE — H&P (Signed)
HISTORY AND PHYSICAL INTERVAL NOTE:  05/09/2020  7:28 AM  Valerie Morales  has presented today for surgery, with the diagnosis of M76.822 TIBIALIS TENDINITIS LEFT LOWER EXTREMITY M65.872  SYNOVITIS AND TENOSYNOVITIS LEFT ANKLE AND FOOT.  The various methods of treatment have been discussed with the patient.  No guarantees were given.  After consideration of risks, benefits and other options for treatment, the patient has consented to surgery.  I have reviewed the patients' chart and labs.     A history and physical examination was performed in my office.  The patient was reexamined.  There have been no changes to this history and physical examination.  Samara Deist A

## 2020-05-09 NOTE — Discharge Instructions (Signed)
[place] DR. TROXLER, DR. Vickki Muff, AND DR. [place]   1. Take your medication as prescribed.  Pain medication should be taken only as needed.  A daily low dose aspirin is recommended.  2. Keep the dressing clean, dry and intact.  3. Keep your foot elevated above the heart level for the first 48 hours.  4. Walking to the bathroom and brief periods of walking are acceptable, unless we have instructed you to be non-weight bearing.  5. Always wear your post-op shoe when walking.  Always use your crutches if you are to be non-weight bearing.  6. Do not take a shower. Baths are permissible as long as the foot is kept out of the water.   7. Every hour you are awake:  - Bend your knee 15 times.  8. Call [facility] [phone]) if any of the following problems occur: - You develop a temperature or fever. - The bandage becomes saturated with blood. - Medication does not stop your pain. - Injury of the foot occurs. - Any symptoms of infection including redness, odor, or red streaks running from wound.

## 2020-05-12 ENCOUNTER — Encounter: Payer: Self-pay | Admitting: Podiatry

## 2020-05-14 ENCOUNTER — Encounter: Payer: Self-pay | Admitting: Podiatry

## 2020-05-27 ENCOUNTER — Ambulatory Visit: Payer: 59 | Admitting: Internal Medicine

## 2020-05-27 ENCOUNTER — Telehealth: Payer: Self-pay | Admitting: Internal Medicine

## 2020-05-27 NOTE — Telephone Encounter (Signed)
Patient no-showed today's appointment; appointment was for 8/10 at 8:30 am, provider notified for review of record. Mychart sent to reschedule.

## 2020-05-28 ENCOUNTER — Encounter: Payer: Self-pay | Admitting: Internal Medicine

## 2020-05-28 ENCOUNTER — Other Ambulatory Visit: Payer: Self-pay

## 2020-05-28 ENCOUNTER — Ambulatory Visit: Payer: 59 | Admitting: Internal Medicine

## 2020-05-28 VITALS — BP 124/86 | HR 92 | Temp 97.6°F | Ht 63.0 in | Wt 241.0 lb

## 2020-05-28 DIAGNOSIS — R000 Tachycardia, unspecified: Secondary | ICD-10-CM

## 2020-05-28 DIAGNOSIS — Z1329 Encounter for screening for other suspected endocrine disorder: Secondary | ICD-10-CM

## 2020-05-28 DIAGNOSIS — F329 Major depressive disorder, single episode, unspecified: Secondary | ICD-10-CM

## 2020-05-28 DIAGNOSIS — F419 Anxiety disorder, unspecified: Secondary | ICD-10-CM

## 2020-05-28 DIAGNOSIS — I10 Essential (primary) hypertension: Secondary | ICD-10-CM

## 2020-05-28 DIAGNOSIS — R7303 Prediabetes: Secondary | ICD-10-CM

## 2020-05-28 DIAGNOSIS — M25572 Pain in left ankle and joints of left foot: Secondary | ICD-10-CM | POA: Diagnosis not present

## 2020-05-28 DIAGNOSIS — Z6841 Body Mass Index (BMI) 40.0 and over, adult: Secondary | ICD-10-CM

## 2020-05-28 DIAGNOSIS — H547 Unspecified visual loss: Secondary | ICD-10-CM

## 2020-05-28 DIAGNOSIS — Z9889 Other specified postprocedural states: Secondary | ICD-10-CM | POA: Insufficient documentation

## 2020-05-28 MED ORDER — LORAZEPAM 0.5 MG PO TABS: 0.5000 mg | ORAL_TABLET | Freq: Every day | ORAL | 2 refills | Status: DC | PRN

## 2020-05-28 MED ORDER — HYDROCODONE-ACETAMINOPHEN 5-325 MG PO TABS: 1.0000 | ORAL_TABLET | Freq: Two times a day (BID) | ORAL | 0 refills | Status: DC | PRN

## 2020-05-28 NOTE — Progress Notes (Signed)
Chief Complaint  Patient presents with  . Follow-up   F/u w/ son Corene Cornea 44 y.o  1. Sinus tachycardia w/o sx's she does not notice heart racing but is using a scooter for reduced wt bearing left ankle since surgery 05/09/20 so this could be cause and pain 7/10 s/p surgery today.  2. S/p sinus tarsi syndrome left will not return to work until 07/2020 3rd week unable to physical exercise activity until foot/anke healed and appt upcoming with podiatry pain 7/10 tried tylenol alt with ibuprofen and not helping for pain and getting easy bruising 3. Increased anxiety due to #2 on effexor 150 mg qd ativan 0.5 prn gad 7 score 12 and phq 9 score 10 today 4. htn controlled on norvasc 5 mg qd and benicar 40-25 mg qd    Review of Systems  Constitutional: Negative for weight loss.  HENT: Negative for hearing loss.   Eyes: Negative for blurred vision.  Respiratory: Negative for shortness of breath.   Cardiovascular: Negative for chest pain.  Gastrointestinal: Negative for abdominal pain.  Musculoskeletal: Positive for joint pain.  Skin: Negative for rash.  Neurological: Negative for headaches.  Endo/Heme/Allergies: Bruises/bleeds easily.  Psychiatric/Behavioral: The patient is nervous/anxious.    Past Medical History:  Diagnosis Date  . Anemia    has required 2 units PRBC  . Anxiety   . Chicken pox   . Depression    trazadone has not helped in the past   . Fatty liver   . Headache   . History of blood transfusion 2009   [place]  . Hypertension   . MRSA (methicillin resistant Staphylococcus aureus)   . Sinusitis   . Sinusitis, chronic   . UTI (urinary tract infection)   . Vitamin D deficiency    Past Surgical History:  Procedure Laterality Date  . ANKLE ARTHROSCOPY Left 05/09/2020   Procedure: ANKLE ARTHROSCOPY OCD REPAIR LEFT;  Surgeon: Samara Deist, DPM;  Location: [facility] ORS;  Service: Podiatry;  Laterality: Left;  . CESAREAN SECTION     x3 Opp  . CHOLECYSTECTOMY   1998  . COLONOSCOPY WITH PROPOFOL N/A 05/01/2019   Procedure: COLONOSCOPY WITH PROPOFOL;  Surgeon: Lucilla Lame, MD;  Location: [facility] ENDOSCOPY;  Service: Endoscopy;  Laterality: N/A;  . ESOPHAGOGASTRODUODENOSCOPY (EGD) WITH PROPOFOL N/A 05/01/2019   Procedure: ESOPHAGOGASTRODUODENOSCOPY (EGD) WITH PROPOFOL;  Surgeon: Lucilla Lame, MD;  Location: [facility] ENDOSCOPY;  Service: Endoscopy;  Laterality: N/A;  . 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 NASAL SINUS SURGERY     2009/2010  . OSTECTOMY Left 05/09/2020   Procedure: EVANS/MCDO LEFT;  Surgeon: Samara Deist, DPM;  Location: [facility] ORS;  Service: Podiatry;  Laterality: Left;  . TENDON TRANSFER Left 05/09/2020   Procedure: FDL TRANSFER;DEEP LEFT;  Surgeon: Samara Deist, DPM;  Location: [facility] ORS;  Service: Podiatry;  Laterality: Left;  . TUBAL LIGATION    . uterine ablation  2010  . WOUND DEBRIDEMENT Left 05/09/2020   Procedure: A-SCOPE/DEBRIDEMENT/EXTENSIVE LEFT;  Surgeon: Samara Deist, DPM;  Location: [facility] ORS;  Service: Podiatry;  Laterality: Left;   Family History  Problem Relation Age of Onset  . Cancer Mother 30       lung cancer-small cell lung cancer   . Heart disease Mother        MI with stents  . Hypertension Mother   . Diabetes Mother   . COPD Mother   . Hypertension Father   . Hyperlipidemia Father   .  Cancer Maternal Aunt        breast cancer  . Breast cancer Maternal Aunt   . Cancer Maternal Uncle        colon cancer  . Mental illness Son   . Alcohol abuse Maternal Aunt        Liver Failure  . Alcohol abuse Maternal Grandfather   . Heart disease Maternal Grandfather   . Arthritis Paternal Grandmother   . Cancer Paternal Grandmother        leukemia   . Hypertension Paternal Grandmother   . Breast cancer Paternal Grandmother   . Cancer Maternal Grandmother        brain cancer   . Heart disease Paternal Grandfather   . Hypertension Paternal Grandfather    Social History   Socioeconomic  History  . Marital status: Married    Spouse name: [name]  . Number of children: 3  . Years of education: 14  . Highest education level: Not on file  Occupational History  . Occupation: caseworker  Tobacco Use  . Smoking status: Never Smoker  . Smokeless tobacco: Never Used  Vaping Use  . Vaping Use: Never used  Substance and Sexual Activity  . Alcohol use: Yes    Comment: 2 DRINK WEEKLY  . Drug use: Not Currently    Types: Marijuana    Comment: 17 YEARS AGO  . Sexual activity: Yes    Birth control/protection: None, Surgical    Comment: tubal ligation  Other Topics Concern  . Not on file  Social History Narrative   Samanvi "[name]" grew up in [place], [place]. She attended [employer] and obtained her Bachelors in [employer]. She lives in [place] with her 3 children and her husband       Caffeine - 2 coffee, no sodas   Exercise - not currently   Bachelors degree    Caseworker    4 pregnancies, 3 live births    Feels safe in relationship    Wears seatbelt   No guns       Social Determinants of Health   Financial Resource Strain:   . Difficulty of Paying Living Expenses:   Food Insecurity:   . Worried About Charity fundraiser in the Last Year:   . Arboriculturist in the Last Year:   Transportation Needs:   . Film/video editor (Medical):   [name] Kitchen Lack of Transportation (Non-Medical):   Physical Activity:   . Days of Exercise per Week:   . Minutes of Exercise per Session:   Stress:   . Feeling of Stress :   Social Connections:   . Frequency of Communication with Friends and Family:   . Frequency of Social Gatherings with Friends and Family:   . Attends Religious Services:   . Active Member of Clubs or Organizations:   . Attends Archivist Meetings:   [name] Kitchen Marital Status:   Intimate Partner Violence:   . Fear of Current or Ex-Partner:   . Emotionally Abused:   [name] Kitchen Physically Abused:   . Sexually Abused:    Current Meds  Medication Sig  .  amLODipine (NORVASC) 5 MG tablet Take 1 tablet (5 mg total) by mouth daily.  [name] Kitchen aspirin EC 81 MG EC tablet Take 1 tablet (81 mg total) by mouth daily.  . cetirizine (ZYRTEC) 10 MG tablet Take 10 mg by mouth daily.  . diphenhydrAMINE (BENADRYL) 25 MG tablet Take 25 mg by mouth daily as needed for allergies.   [name] Kitchen  LORazepam (ATIVAN) 0.5 MG tablet Take 1 tablet (0.5 mg total) by mouth daily as needed for anxiety.  . Multiple Vitamins-Minerals (MULTIVITAMIN WITH MINERALS) tablet Take 1 tablet by mouth daily. Woman  . olmesartan-hydrochlorothiazide (BENICAR HCT) 40-25 MG tablet Take 1 tablet by mouth daily.  . phentermine (ADIPEX-P) 37.5 MG tablet Take 1 tablet (37.5 mg total) by mouth daily before breakfast. Rx 2/2 (Patient taking differently: Take 37.5 mg by mouth daily before breakfast. )  . potassium chloride SA (KLOR-CON) 20 MEQ tablet Take 1 tablet (20 mEq total) by mouth 2 (two) times daily.  [name] Kitchen venlafaxine XR (EFFEXOR-XR) 150 MG 24 hr capsule Take 1 capsule (150 mg total) by mouth daily with breakfast.  . [DISCONTINUED] LORazepam (ATIVAN) 0.5 MG tablet Take 1 tablet (0.5 mg total) by mouth daily as needed for anxiety.   Allergies  Allergen Reactions  . Other Hives     Nut allergy . Walnuts and hazelnuts are the worst.   . Penicillins Itching    Hives   . Percocet [Oxycodone-Acetaminophen] Hives    Hives   . Aspirin Itching    hives  . Latex Rash    Hives .    Recent Results (from the past 2160 hour(s))  POCT urine pregnancy     Status: None   Collection Time: 04/23/20  9:05 AM  Result Value Ref Range   Preg Test, Ur Negative Negative  HCG, Tumor Marker     Status: None   Collection Time: 04/23/20  9:29 AM  Result Value Ref Range   hCG Quant <1 mIU/mL    Comment:                      Female (Non-pregnant)    0 -     5                             (Postmenopausal)  0 -     8                      Female (Pregnant)                      Weeks of Gestation                              3                 6 -    71                              4               10 -   750                              5              217 -  7138                              6              158 - [phone]  7             [id number] -[id number]                              [place] Roche E CLIA methodology   FSH/LH     Status: None   Collection Time: 04/23/20  9:29 AM  Result Value Ref Range   LH 3.5 mIU/mL    Comment:                     Adult Female:                       Follicular phase      2.4 -  12.6                       Ovulation phase      14.0 -  95.6                       Luteal phase          1.0 -  11.4                       Postmenopausal        7.7 -  58.5  FSH 2.9 mIU/mL    Comment:                     Adult Female:                       Follicular phase      3.5 -  12.5                       Ovulation phase       4.7 -  21.5                       Luteal phase          1.7 -   7.7                       Postmenopausal       25.8 - 134.8   Estradiol     Status: None   Collection Time: 04/23/20  9:29 AM  Result Value Ref Range   Estradiol 111.0 pg/mL    Comment:                     Adult Female:                       Follicular phase   61.6 -   166.0                       Ovulation phase    85.8 -   498.0                       Luteal phase       43.8 -   211.0                       Postmenopausal     <6.0 -    54.7                     Pregnancy                       1st trimester     215.0 -  >4300.0 Roche ECLIA methodology   Progesterone     Status: None   Collection Time: 04/23/20  9:29 AM  Result Value Ref Range   Progesterone 3.8 ng/mL    Comment:                      Follicular phase       0.1 -   0.9                      Luteal phase           1.8 -  23.9                      Ovulation phase        0.1 -  12.0                      Pregnant                         First trimester    11.0 -  44.3  Second trimester   25.4 -  83.3                         Third trimester    58.7 - 214.0                      Postmenopausal         0.0 -   0.1   Potassium     Status: None   Collection Time: 05/05/20  3:02 PM  Result Value Ref Range   Potassium 3.6 3.5 - 5.1 mmol/L    Comment: Performed at [facility], [place], [place] [address]  SARS CORONAVIRUS 2 (TAT 6-24 HRS) Nasopharyngeal Nasopharyngeal Swab     Status: None   Collection Time: 05/07/20 11:29 AM   Specimen: Nasopharyngeal Swab  Result Value Ref Range   SARS Coronavirus 2 NEGATIVE NEGATIVE    Comment: (NOTE) SARS-CoV-2 target nucleic acids are NOT DETECTED.  The SARS-CoV-2 RNA is generally detectable in upper and lower respiratory specimens during the acute phase of infection. Negative results do not preclude SARS-CoV-2 infection, do not rule out co-infections with other pathogens, and should not be used as the sole basis for treatment or other patient management decisions. Negative results must be combined with clinical observations, patient history, and epidemiological information. The expected result is Negative.  Fact Sheet for Patients: SugarRoll.be  Fact Sheet for Healthcare Providers: https://www.woods-mathews.com/  This test is not yet approved or cleared by the [place] FDA and  has been authorized for detection and/or diagnosis of SARS-CoV-2 by FDA under an Emergency Use Authorization (EUA). This EUA  will remain  in effect (meaning this test can be used) for the duration of the COVID-19 declaration under Se ction 564(b)(1) of the Act, 21 U.S.C. section 360bbb-3(b)(1), unless the authorization is terminated or revoked sooner.  Performed at [facility], [place] [address] [place], [place] [address]   Pregnancy, urine POC     Status: None   Collection Time: 05/09/20  6:32 AM  Result Value Ref Range   Preg Test, Ur NEGATIVE NEGATIVE    Comment:        THE SENSITIVITY OF THIS METHODOLOGY IS >24 mIU/mL    Objective  Body mass index is 42.69 kg/m. Wt Readings from Last 3 Encounters:  05/28/20 241 lb (109.3 kg)  05/09/20 (!) 241 lb (109.3 kg)  04/30/20 241 lb (109.3 kg)   Temp Readings from Last 3 Encounters:  05/28/20 97.6 F (36.4 C) (Oral)  05/09/20 (!) 97.4 F (36.3 C) (Tympanic)  01/24/20 97.8 F (36.6 C) (Temporal)   BP Readings from Last 3 Encounters:  05/28/20 124/86  05/09/20 118/72  04/23/20 (!) 142/94   Pulse Readings from Last 3 Encounters:  05/28/20 92  05/09/20 (!) 107  04/23/20 (!) 102    Physical Exam Vitals and nursing note reviewed.  Constitutional:      Appearance: Normal appearance. She is well-developed and well-groomed. She is morbidly obese.  HENT:     Head: Normocephalic and atraumatic.  Eyes:     Conjunctiva/sclera: Conjunctivae normal.     Pupils: Pupils are equal, round, and reactive to light.  Cardiovascular:     Rate and Rhythm: Normal rate and regular rhythm.     Heart sounds: Normal heart sounds. No murmur heard.   Pulmonary:     Effort: Pulmonary effort is normal.     Breath sounds: Normal  breath sounds.  Skin:    General: Skin is warm and dry.  Neurological:     General: No focal deficit present.     Mental Status: She is alert and oriented to person, place, and time. Mental status is at baseline.     Gait: Gait normal.  Psychiatric:        Attention and Perception: Attention and perception normal.        Mood and  Affect: Mood and affect normal.        Speech: Speech normal.        Behavior: Behavior normal. Behavior is cooperative.        Thought Content: Thought content normal.        Cognition and Memory: Cognition and memory normal.        Judgment: Judgment normal.     Assessment  Plan  Sinus tachycardia Repeat 92 likely 2/2 exertion   Sinus tarsi syndrome of left ankle - Plan: HYDROcodone-acetaminophen (NORCO) 5-325 MG tablet F/u podiatry  S/P foot surgery, left - Plan: HYDROcodone-acetaminophen (NORCO) 5-325 MG tablet  Anxiety and depression - Plan: LORazepam (ATIVAN) 0.5 MG tablet Cont effexor 150 mg qd   Essential hypertension  Cont meds controlled  Monitor HR on home BP machine  HM-CPE 07/2020 Declinedflu shot  Tdaphad Declines hep B status checkand MMR check Declines covid 19 vaccine  Declines STD check hep C/HIV  Pap had 09/07/17 Dr. Marcelline Mates get records appt sch 10/28/20neg neg HPV  mammo abnormal 01/2018 repeat dx mammo and [place] left, had 07/19/18 repeat ordered 4/2/2020dx mammo repeat 4/5//21 neg Screening 01/2021   EGD/colonoscpy had 05/01/19 [facility] bx negative DEXA age 27   Provider: Dr. Olivia Mackie McLean-Scocuzza-Internal Medicine

## 2020-05-28 NOTE — Progress Notes (Signed)
Patient presenting for follow up. Having right sided foot and ankle pain due to recent surgery.   Patient is no weight bearing on the left foot and could not stand to weigh. Did not know the weight of her scooter. Weight in system used.

## 2020-05-28 NOTE — Patient Instructions (Signed)
Sinus Tachycardia  Sinus tachycardia is a kind of fast heartbeat. In sinus tachycardia, the heart beats more than 100 times a minute. Sinus tachycardia starts in a part of the heart called the sinus node. Sinus tachycardia may be harmless, or it may be a sign of a serious condition. What are the causes? This condition may be caused by:  Exercise or exertion.  A fever.  Pain.  Loss of body fluids (dehydration).  Severe bleeding (hemorrhage).  Anxiety and stress.  Certain substances, including: ? Alcohol. ? Caffeine. ? Tobacco and nicotine products. ? Cold medicines. ? Illegal drugs.  Medical conditions including: ? Heart disease. ? An infection. ? An overactive thyroid (hyperthyroidism). ? A lack of red blood cells (anemia). What are the signs or symptoms? Symptoms of this condition include:  A feeling that the heart is beating quickly (palpitations).  Suddenly noticing your heartbeat (cardiac awareness).  Dizziness.  Tiredness (fatigue).  Shortness of breath.  Chest pain.  Nausea.  Fainting. How is this diagnosed? This condition is diagnosed with:  A physical exam.  Other tests, such as: ? Blood tests. ? An electrocardiogram (ECG). This test measures the electrical activity of the heart. ? Ambulatory cardiac monitor. This records your heartbeats for 24 hours or more. You may be referred to a heart specialist (cardiologist). How is this treated? Treatment for this condition depends on the cause or the underlying condition. Treatment may involve:  Treating the underlying condition.  Taking new medicines or changing your current medicines as told by your health care provider.  Making changes to your diet or lifestyle. Follow these instructions at home: Lifestyle   Do not use any products that contain nicotine or tobacco, such as cigarettes and e-cigarettes. If you need help quitting, ask your health care provider.  Do not use illegal drugs, such as  cocaine.  Learn relaxation methods to help you when you get stressed or anxious. These include deep breathing.  Avoid caffeine or other stimulants. Alcohol use   Do not drink alcohol if: ? Your health care provider tells you not to drink. ? You are pregnant, may be pregnant, or are planning to become pregnant.  If you drink alcohol, limit how much you have: ? 0-1 drink a day for women. ? 0-2 drinks a day for men.  Be aware of how much alcohol is in your drink. In the [place] one drink equals one typical bottle of beer (12 oz), one-half glass of wine (5 oz), or one shot of hard liquor (1 oz). General instructions  Drink enough fluids to keep your urine pale yellow.  Take over-the-counter and prescription medicines only as told by your health care provider.  Keep all follow-up visits as told by your health care provider. This is important. Contact a health care provider if you have:  A fever.  Vomiting or diarrhea that does not go away. Get help right away if you:  Have pain in your chest, upper arms, jaw, or neck.  Become weak or dizzy.  Feel faint.  Have palpitations that do not go away. Summary  In sinus tachycardia, the heart beats more than 100 times a minute.  Sinus tachycardia may be harmless, or it may be a sign of a serious condition.  Treatment for this condition depends on the cause or the underlying condition.  Get help right away if you have pain in your chest, upper arms, jaw, or neck. This information is not intended to replace advice given to you by   your health care provider. Make sure you discuss any questions you have with your health care provider. Document Revised: 11/23/2017 Document Reviewed: 11/23/2017 Elsevier Patient Education  2020 [employer]  

## 2020-05-28 NOTE — Addendum Note (Signed)
Addended by: Orland Mustard on: 05/28/2020 06:13 PM   Modules accepted: Orders

## 2020-06-01 ENCOUNTER — Encounter: Payer: Self-pay | Admitting: Internal Medicine

## 2020-06-16 IMAGING — MG DIGITAL DIAGNOSTIC UNILATERAL LEFT MAMMOGRAM WITH TOMO AND CAD
6 series · 6 of 18 positions shown · non-contrast
Comparison: Previous exam(s).

CLINICAL DATA: Six-month follow-up for a probably benign left
breast asymmetry and probable intramammary lymph node.

EXAM:
DIGITAL DIAGNOSTIC UNILATERAL LEFT MAMMOGRAM WITH CAD AND TOMO

[L MLO synth-2D]
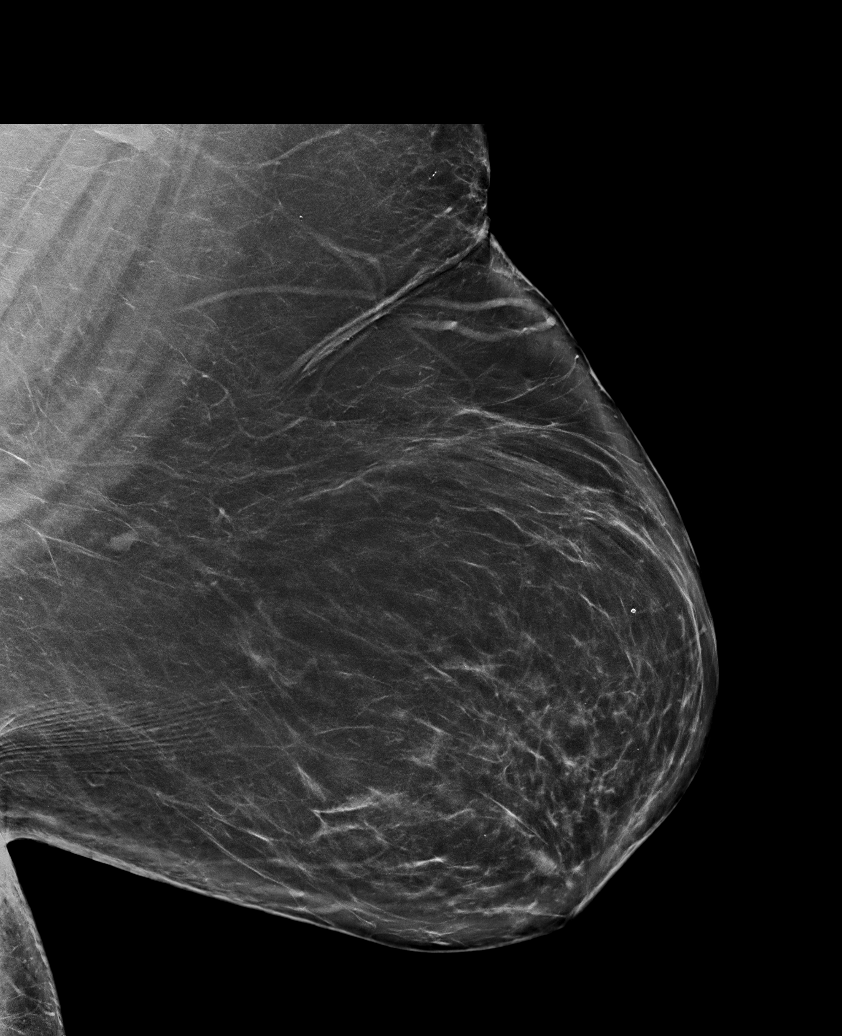

[L CC synth-2D (1 of 2)]
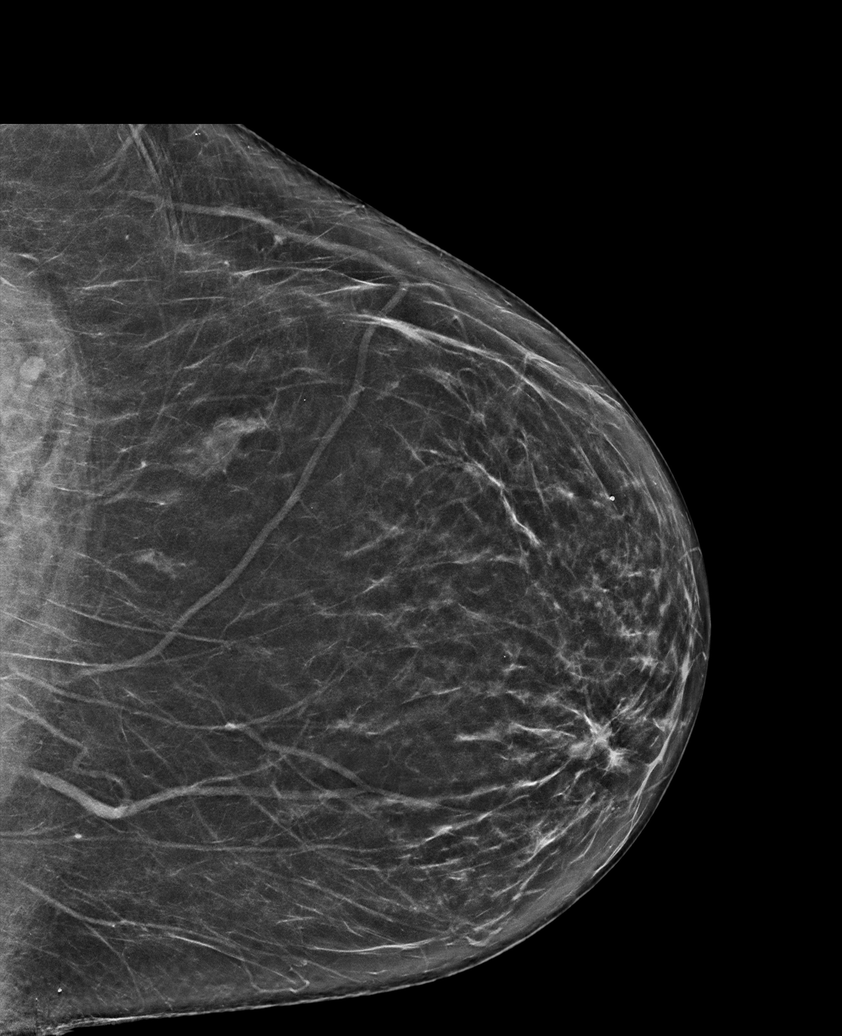

[L CC synth-2D (2 of 2)]
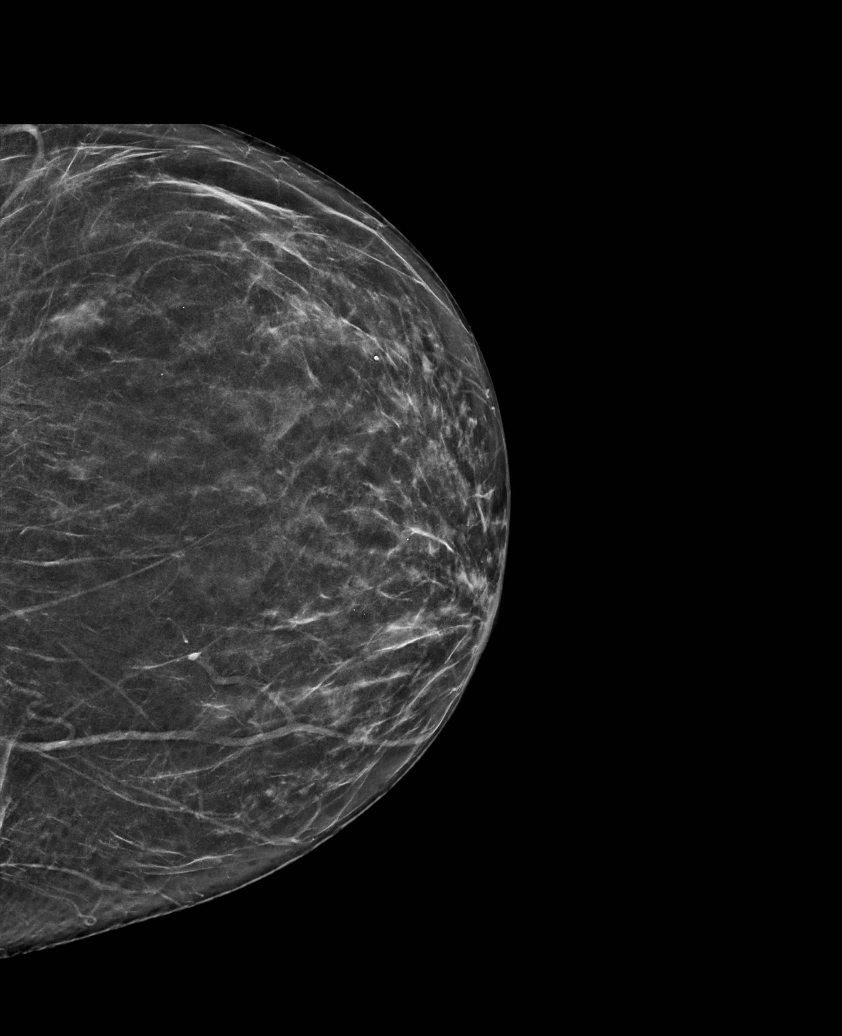

[L CC tomo (1 of 2) · tomo slice 33/65.0]
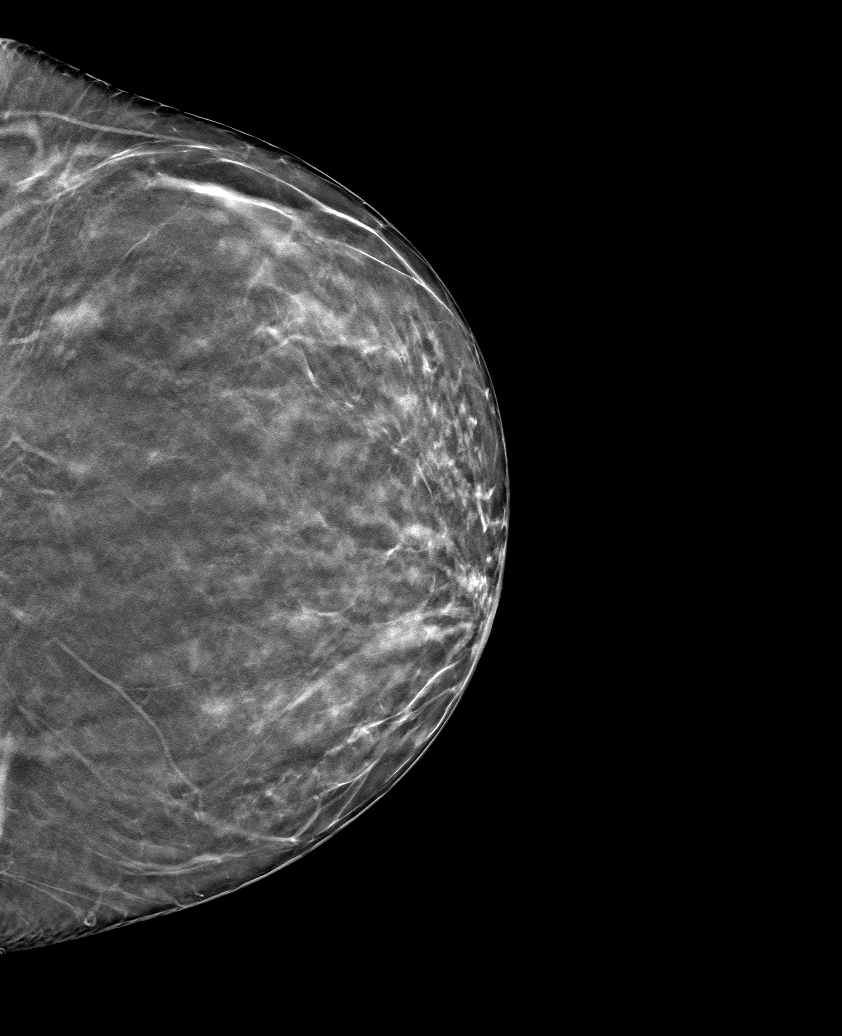

[L MLO tomo · tomo slice 47/93.0]
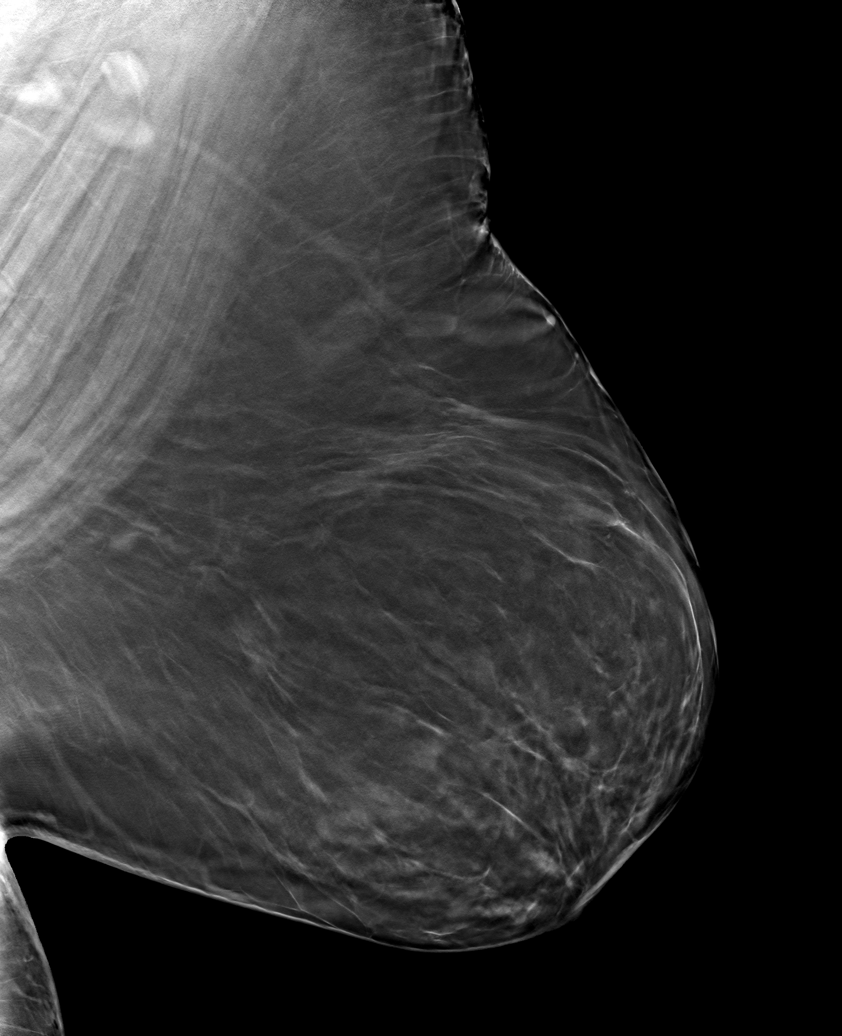

[L CC tomo (2 of 2) · tomo slice 37/74.0]
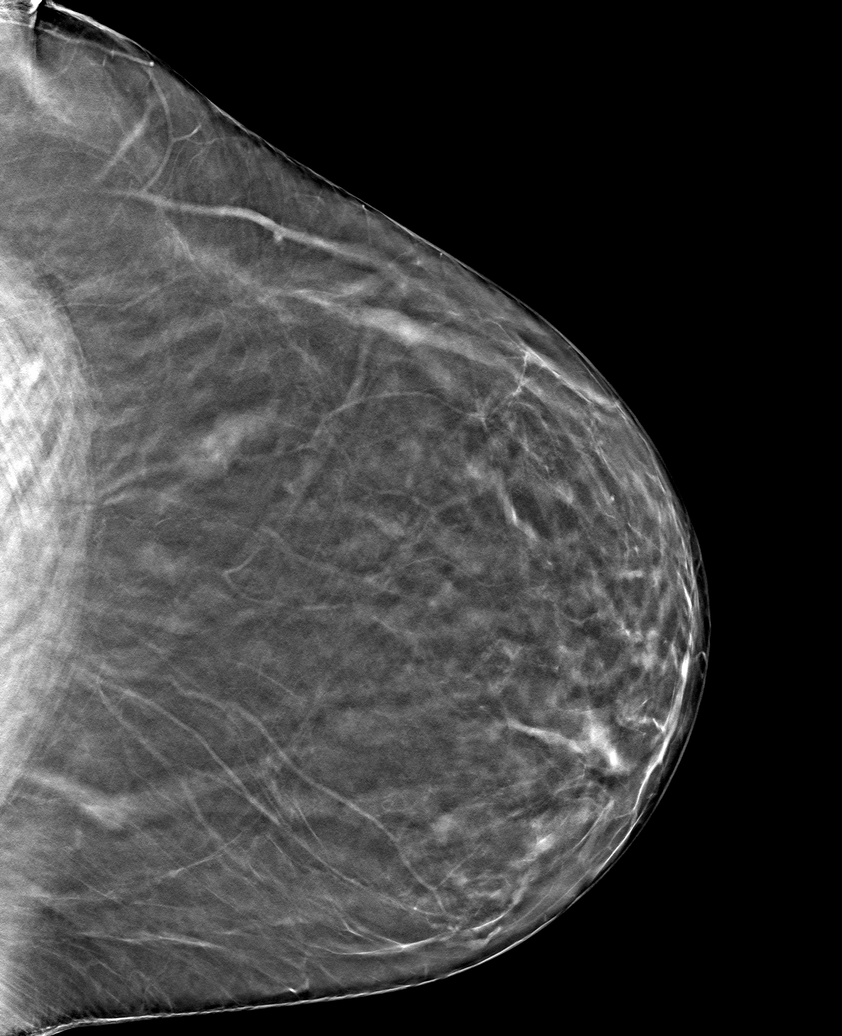

[6 of 18 positions shown; findings below may reference images not displayed]

ACR Breast Density Category b: There are scattered areas of
fibroglandular density.
FINDINGS: The probable lymph node in the far posterior lower outer left breast
is stable in size. The focal asymmetry in the lower outer posterior
depth of the left breast is mammographically stable.

Mammographic images were processed with CAD.
IMPRESSION: 1. The left breast probable intramammary lymph node and probably
benign left breast asymmetry are mammographically stable.

RECOMMENDATION:
Bilateral diagnostic mammogram and left breast ultrasound is
recommended in Thursday January, 2019.

I have discussed the findings and recommendations with the patient.
Results were also provided in writing at the conclusion of the
visit. If applicable, a reminder letter will be sent to the patient
regarding the next appointment.

BI-RADS CATEGORY  3: Probably benign.

## 2020-07-23 ENCOUNTER — Other Ambulatory Visit: Payer: Self-pay

## 2020-07-23 ENCOUNTER — Ambulatory Visit (INDEPENDENT_AMBULATORY_CARE_PROVIDER_SITE_OTHER): Payer: 59

## 2020-07-23 VITALS — BP 106/73 | HR 114

## 2020-07-23 DIAGNOSIS — R7303 Prediabetes: Secondary | ICD-10-CM | POA: Diagnosis not present

## 2020-07-23 DIAGNOSIS — Z1329 Encounter for screening for other suspected endocrine disorder: Secondary | ICD-10-CM | POA: Diagnosis not present

## 2020-07-23 DIAGNOSIS — R000 Tachycardia, unspecified: Secondary | ICD-10-CM | POA: Diagnosis not present

## 2020-07-23 DIAGNOSIS — I10 Essential (primary) hypertension: Secondary | ICD-10-CM

## 2020-07-23 LAB — COMPREHENSIVE METABOLIC PANEL
ALT: 73 U/L — ABNORMAL HIGH (ref 0–35)
AST: 128 U/L — ABNORMAL HIGH (ref 0–37)
Albumin: 4 g/dL (ref 3.5–5.2)
Alkaline Phosphatase: 149 U/L — ABNORMAL HIGH (ref 39–117)
BUN: 8 mg/dL (ref 6–23)
CO2: 27 mEq/L (ref 19–32)
Calcium: 9.1 mg/dL (ref 8.4–10.5)
Chloride: 99 mEq/L (ref 96–112)
Creatinine, Ser: 0.86 mg/dL (ref 0.40–1.20)
GFR: 81.51 mL/min (ref >60.00)
Glucose, Bld: 89 mg/dL (ref 70–99)
Potassium: 3.6 mEq/L (ref 3.5–5.1)
Sodium: 136 mEq/L (ref 135–145)
Total Bilirubin: 0.6 mg/dL (ref 0.2–1.2)
Total Protein: 6.6 g/dL (ref 6.0–8.3)

## 2020-07-23 LAB — CBC WITH DIFFERENTIAL/PLATELET
Basophils Absolute: 0 10*3/uL (ref 0.0–0.1)
Basophils Relative: 0.6 % (ref 0.0–3.0)
Eosinophils Absolute: 0.2 10*3/uL (ref 0.0–0.7)
Eosinophils Relative: 3 % (ref 0.0–5.0)
HCT: 36.1 % (ref 36.0–46.0)
Hemoglobin: 12.4 g/dL (ref 12.0–15.0)
Lymphocytes Relative: 33.7 % (ref 12.0–46.0)
Lymphs Abs: 1.9 10*3/uL (ref 0.7–4.0)
MCHC: 34.3 g/dL (ref 30.0–36.0)
MCV: 99.7 fl (ref 78.0–100.0)
Monocytes Absolute: 0.3 10*3/uL (ref 0.1–1.0)
Monocytes Relative: 5.8 % (ref 3.0–12.0)
Neutro Abs: 3.2 10*3/uL (ref 1.4–7.7)
Neutrophils Relative %: 56.9 % (ref 43.0–77.0)
Platelets: 222 10*3/uL (ref 150.0–400.0)
RBC: 3.62 Mil/uL — ABNORMAL LOW (ref 3.87–5.11)
RDW: 16.7 % — ABNORMAL HIGH (ref 11.5–15.5)
WBC: 5.6 10*3/uL (ref 4.0–10.5)

## 2020-07-23 LAB — TSH: TSH: 2.74 u[IU]/mL (ref 0.35–4.50)

## 2020-07-23 LAB — LIPID PANEL
Cholesterol: 240 mg/dL — ABNORMAL HIGH (ref 0–200)
HDL: 97.8 mg/dL (ref >39.00)
LDL Cholesterol: 102 mg/dL — ABNORMAL HIGH (ref 0–99)
NonHDL: 141.73
Total CHOL/HDL Ratio: 2
Triglycerides: 200 mg/dL — ABNORMAL HIGH (ref 0.0–149.0)
VLDL: 40 mg/dL (ref 0.0–40.0)

## 2020-07-23 LAB — HEMOGLOBIN A1C: Hgb A1c MFr Bld: 5.5 % (ref 4.6–6.5)

## 2020-07-23 NOTE — Progress Notes (Signed)
Patient is here for a BP check due to bp being high at last visit, as per patient.  Currently patients BP is 106/73 and BPM is 114.  Patient has no complaints of headaches, blurry vision, chest pain, arm pain, light headedness, dizziness, and nor jaw pain. Provider gave verbal order.

## 2020-07-27 ENCOUNTER — Encounter: Payer: Self-pay | Admitting: Internal Medicine

## 2020-08-05 ENCOUNTER — Other Ambulatory Visit: Payer: Self-pay | Admitting: Internal Medicine

## 2020-08-05 DIAGNOSIS — Z6841 Body Mass Index (BMI) 40.0 and over, adult: Secondary | ICD-10-CM

## 2020-08-14 ENCOUNTER — Other Ambulatory Visit: Payer: Self-pay | Admitting: Internal Medicine

## 2020-08-14 ENCOUNTER — Telehealth: Payer: Self-pay | Admitting: Internal Medicine

## 2020-08-14 DIAGNOSIS — R748 Abnormal levels of other serum enzymes: Secondary | ICD-10-CM

## 2020-08-14 NOTE — Telephone Encounter (Signed)
Call pt and reschedule repeat liver enzymes please

## 2020-08-18 NOTE — Telephone Encounter (Signed)
Left message to return call 

## 2020-08-19 ENCOUNTER — Telehealth: Payer: Self-pay | Admitting: Internal Medicine

## 2020-08-19 NOTE — Telephone Encounter (Signed)
Patient called back again for lab

## 2020-08-19 NOTE — Telephone Encounter (Signed)
Patient was returning call for repeat lab

## 2020-08-20 NOTE — Telephone Encounter (Signed)
Patient scheduled for 08/28/20 at 8:45 am

## 2020-08-28 ENCOUNTER — Other Ambulatory Visit (INDEPENDENT_AMBULATORY_CARE_PROVIDER_SITE_OTHER): Payer: 59

## 2020-08-28 ENCOUNTER — Other Ambulatory Visit: Payer: Self-pay

## 2020-08-28 DIAGNOSIS — R748 Abnormal levels of other serum enzymes: Secondary | ICD-10-CM | POA: Diagnosis not present

## 2020-08-28 LAB — HEPATIC FUNCTION PANEL
ALT: 33 U/L (ref 0–35)
AST: 47 U/L — ABNORMAL HIGH (ref 0–37)
Albumin: 4 g/dL (ref 3.5–5.2)
Alkaline Phosphatase: 101 U/L (ref 39–117)
Bilirubin, Direct: 0.1 mg/dL (ref 0.0–0.3)
Total Bilirubin: 0.3 mg/dL (ref 0.2–1.2)
Total Protein: 6.4 g/dL (ref 6.0–8.3)

## 2020-10-06 ENCOUNTER — Other Ambulatory Visit: Payer: Self-pay | Admitting: Internal Medicine

## 2020-10-06 DIAGNOSIS — Z6841 Body Mass Index (BMI) 40.0 and over, adult: Secondary | ICD-10-CM

## 2020-10-06 MED ORDER — PHENTERMINE HCL 37.5 MG PO TABS: 37.5000 mg | ORAL_TABLET | Freq: Every day | ORAL | 0 refills | Status: DC

## 2020-10-07 ENCOUNTER — Encounter: Payer: Self-pay | Admitting: Internal Medicine

## 2020-11-20 ENCOUNTER — Encounter: Payer: Self-pay | Admitting: Internal Medicine

## 2020-11-20 ENCOUNTER — Telehealth (INDEPENDENT_AMBULATORY_CARE_PROVIDER_SITE_OTHER): Payer: 59 | Admitting: Internal Medicine

## 2020-11-20 ENCOUNTER — Other Ambulatory Visit: Payer: Self-pay

## 2020-11-20 VITALS — Ht 63.0 in | Wt 225.0 lb

## 2020-11-20 DIAGNOSIS — G4700 Insomnia, unspecified: Secondary | ICD-10-CM

## 2020-11-20 DIAGNOSIS — B373 Candidiasis of vulva and vagina: Secondary | ICD-10-CM

## 2020-11-20 DIAGNOSIS — R519 Headache, unspecified: Secondary | ICD-10-CM | POA: Diagnosis not present

## 2020-11-20 DIAGNOSIS — J0191 Acute recurrent sinusitis, unspecified: Secondary | ICD-10-CM | POA: Diagnosis not present

## 2020-11-20 DIAGNOSIS — B3731 Acute candidiasis of vulva and vagina: Secondary | ICD-10-CM

## 2020-11-20 DIAGNOSIS — I10 Essential (primary) hypertension: Secondary | ICD-10-CM

## 2020-11-20 DIAGNOSIS — R0683 Snoring: Secondary | ICD-10-CM

## 2020-11-20 DIAGNOSIS — F419 Anxiety disorder, unspecified: Secondary | ICD-10-CM

## 2020-11-20 DIAGNOSIS — F32A Depression, unspecified: Secondary | ICD-10-CM

## 2020-11-20 DIAGNOSIS — Z1231 Encounter for screening mammogram for malignant neoplasm of breast: Secondary | ICD-10-CM

## 2020-11-20 MED ORDER — TIZANIDINE HCL 4 MG PO CAPS: 4.0000 mg | ORAL_CAPSULE | Freq: Every evening | ORAL | 0 refills | Status: DC | PRN

## 2020-11-20 MED ORDER — AMLODIPINE BESYLATE 5 MG PO TABS: 5.0000 mg | ORAL_TABLET | Freq: Every day | ORAL | 3 refills | Status: DC

## 2020-11-20 MED ORDER — FLUCONAZOLE 150 MG PO TABS: 150.0000 mg | ORAL_TABLET | Freq: Once | ORAL | 0 refills | Status: AC

## 2020-11-20 MED ORDER — LORAZEPAM 0.5 MG PO TABS: 0.5000 mg | ORAL_TABLET | Freq: Every day | ORAL | 2 refills | Status: DC | PRN

## 2020-11-20 MED ORDER — AZITHROMYCIN 250 MG PO TABS: ORAL_TABLET | ORAL | 0 refills | Status: DC

## 2020-11-20 NOTE — Progress Notes (Signed)
Headaches on going for the past 3 weeks. Happens around the same time each day 1:30- 2:00pm.  Unknown what BP has been as she can not find her Blood pressure machine.  Patient states she drank 16 oz of water this morning.  No nausea or vomiting. Having light headedness and dizziness, blurred vision.

## 2020-11-20 NOTE — Progress Notes (Addendum)
Virtual Visit via Video Note  I connected with Valerie Morales  on 11/20/20 at  2:15 PM EST by a video enabled telemedicine application and verified that I am speaking with the correct person using two identifiers.  Location patient: home, [place] Location provider:work or home office Persons participating in the virtual visit: patient, provider  I discussed the limitations of evaluation and management by telemedicine and the availability of in person appointments. The patient expressed understanding and agreed to proceed.   HPI: 1. H/a x 3 weeks it woke her up at 3 am she has seen [facility] recently but they had to reschedule her appt they are worried she may have early glaucoma. H/a back of head and b/l temples (will last from ~2pm to 5-6 Pm) associated with dizziness, blurry vision worse x 3 weeks. H/a 9/10. She had fragmented sleep 10 pm to 3 am then waking up at 5:30 am she has stress about going back to work soon Monday in person when prev. Told she could work from home. 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 No h/o migraines, no n/v associated h/a H/a comes everyday around 1:30-2 pm and she looks back and forth at computer screens as well not sure if this is the issue but asked her to disc with eye MD  She tried excedrine otc  She has h/o HTN on norvasc and benicar hct but has not taking meds today nor checked BP needs new meter  She does snore at night not sure if stops breathing  She has fragmented sleep and drinking 8 oz coffee QOD  She does have h/o sinus issues s/p sinus surgery Ct head 04/24/18   IMPRESSION: 1. Previous bilateral maxillary antrostomies, middle turbinectomies, and at least partial ethmoidectomies. 2. Moderate diffuse mucosal thickening and sinus opacification. Suspected bilateral residual ethmoid mucoceles, and bilateral maxillary mucous retention cysts. 3. Small nasal septal perforation suspected on coronal image 30. 4. 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 5. Carious posterior left maxillary dentition. No acute osseous abnormality identified.   Saw ENT in the past given Abx and rec allergy shots but caused allergic reaction so she stopped   Denies covid 19 exposure   Sleep d/o of note tried [place] in the past caused her to hallucinate and fall has tried ativan 0.5 prn for anxiety and disc this can help sleep at night and rec melatonin 10 mg qhs  -COVID-19 vaccine status: moderna 2/2  ROS: See pertinent positives and negatives per HPI.  Past Medical History:  Diagnosis Date  . Anemia    has required 2 units PRBC  . Anxiety   . Chicken pox   . Depression    trazadone has not helped in the past   . Fatty liver   . Headache   . History of blood transfusion 2009   [place]  . Hypertension   . MRSA (methicillin resistant Staphylococcus aureus)   . Sinusitis   . Sinusitis, chronic   . UTI (urinary tract infection)   . Vitamin D deficiency     Past Surgical History:  Procedure Laterality Date  . ANKLE ARTHROSCOPY Left 05/09/2020   Procedure: ANKLE ARTHROSCOPY OCD REPAIR LEFT;  Surgeon: Samara Deist, DPM;  Location: [facility] ORS;  Service: Podiatry;  Laterality: Left;  . CESAREAN SECTION     x3 [place]  . CHOLECYSTECTOMY  1998  . COLONOSCOPY WITH PROPOFOL N/A 05/01/2019  Procedure: COLONOSCOPY WITH PROPOFOL;  Surgeon: Lucilla Lame, MD;  Location: [facility] ENDOSCOPY;  Service: Endoscopy;  Laterality: N/A;  . ESOPHAGOGASTRODUODENOSCOPY (EGD) WITH PROPOFOL N/A 05/01/2019   Procedure: ESOPHAGOGASTRODUODENOSCOPY (EGD) WITH PROPOFOL;  Surgeon: Lucilla Lame, MD;  Location: [facility] ENDOSCOPY;  Service: Endoscopy;  Laterality: N/A;  . 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 NASAL SINUS SURGERY     2009/2010  . OSTECTOMY Left 05/09/2020   Procedure: EVANS/MCDO LEFT;  Surgeon: Samara Deist, DPM;  Location: [facility] ORS;  Service: Podiatry;   Laterality: Left;  . TENDON TRANSFER Left 05/09/2020   Procedure: FDL TRANSFER;DEEP LEFT;  Surgeon: Samara Deist, DPM;  Location: [facility] ORS;  Service: Podiatry;  Laterality: Left;  . TUBAL LIGATION    . uterine ablation  2010  . WOUND DEBRIDEMENT Left 05/09/2020   Procedure: A-SCOPE/DEBRIDEMENT/EXTENSIVE LEFT;  Surgeon: Samara Deist, DPM;  Location: [facility] ORS;  Service: Podiatry;  Laterality: Left;     Current Outpatient Medications:  .  acetaminophen (TYLENOL) 650 MG CR tablet, , Disp: , Rfl:  .  amLODipine (NORVASC) 5 MG tablet, Take 1 tablet (5 mg total) by mouth daily., Disp: 90 tablet, Rfl: 3 .  azithromycin (ZITHROMAX) 250 MG tablet, 2 pills day 1 and 1 pill day 2-5 with food, Disp: 6 tablet, Rfl: 0 .  cetirizine (ZYRTEC) 10 MG tablet, Take 10 mg by mouth daily., Disp: , Rfl:  .  diphenhydrAMINE (BENADRYL) 25 MG tablet, Take 25 mg by mouth daily as needed for allergies. , Disp: , Rfl:  .  etodolac (LODINE) 400 MG tablet, Take by mouth., Disp: , Rfl:  .  fluconazole (DIFLUCAN) 150 MG tablet, Take 1 tablet (150 mg total) by mouth once for 1 dose., Disp: 1 tablet, Rfl: 0 .  LORazepam (ATIVAN) 0.5 MG tablet, Take 1 tablet (0.5 mg total) by mouth daily as needed for anxiety., Disp: 30 tablet, Rfl: 2 .  Multiple Vitamins-Minerals (MULTIVITAMIN WITH MINERALS) tablet, Take 1 tablet by mouth daily. Woman, Disp: , Rfl:  .  olmesartan-hydrochlorothiazide (BENICAR HCT) 40-25 MG tablet, Take 1 tablet by mouth daily., Disp: 90 tablet, Rfl: 3 .  phentermine (ADIPEX-P) 37.5 MG tablet, Take 1 tablet (37.5 mg total) by mouth daily before breakfast. Last Rx then will need 3 month break off meds, Disp: 60 tablet, Rfl: 0 .  tiZANidine (ZANAFLEX) 4 MG capsule, Take 1 capsule (4 mg total) by mouth at bedtime as needed for muscle spasms., Disp: 30 capsule, Rfl: 0 .  venlafaxine XR (EFFEXOR-XR) 150 MG 24 hr capsule, Take 1 capsule (150 mg total) by mouth daily with breakfast., Disp: 90 capsule, Rfl: 3 .   potassium chloride SA (KLOR-CON) 20 MEQ tablet, Take 1 tablet (20 mEq total) by mouth 2 (two) times daily. (Patient not taking: Reported on 11/20/2020), Disp: 180 tablet, Rfl: 3  EXAM:  VITALS per patient if applicable:  GENERAL: alert, oriented, appears well and in no acute distress  HEENT: atraumatic, conjunttiva clear, no obvious abnormalities on inspection of external nose and ears  NECK: normal movements of the head and neck  LUNGS: on inspection no signs of respiratory distress, breathing rate appears normal, no obvious gross SOB, gasping or wheezing  CV: no obvious cyanosis  MS: moves all visible extremities without noticeable abnormality  PSYCH/NEURO: pleasant and cooperative, no obvious depression or anxiety, speech and thought processing grossly intact  ASSESSMENT AND PLAN:  Discussed the following assessment and plan:  Acute intractable headache, unspecified headache  type ?htn, stress, tension, sinus related, no h/o migraines, OSA- Plan: tiZANidine (ZANAFLEX) 4 MG capsule Refer to neurology to further manage and tx and consider sleep study to r/o osa  Acute recurrent sinusitis, unspecified location - Plan: azithromycin (ZITHROMAX) 250 MG tablet  Occipital headache - Plan: tiZANidine (ZANAFLEX) 4 MG capsule  Yeast vaginitis - Plan: fluconazole (DIFLUCAN) 150 MG tablet  Insomnia, unspecified type Prn Ativan 0.5 mg qhs prn add melatonin 10 mg qhs   Hypertension, unspecified type On benicar hctz 40-25 mg qd and norvasc 5 mg qd  ? meds if compliant  Get new BP cuff and monitor  Goal <130/<80  Did not take meds yet today during visit   Snoring Needs CPAP ordered and w/u  Obesity  Hold adipex due to h/a hx and htn  wegovy will start  HM Declinedflu shot  Tdaphad Declines hep B status checkand MMR check moderna 2/2  Declines STD check hep C/HIV  Pap had 09/07/17 Dr. Marcelline Mates get records appt sch 10/28/20neg neg HPV  mammo abnormal 01/2018 repeat dx mammo  and [place] left, had 07/19/18 repeat ordered 4/2/2020dx mammo repeat 4/5//21neg Screening 01/2021  EGD/colonoscpy had 05/01/19 [facility] bx negative DEXA age 48   rec healthy diet and exercise   -we discussed possible serious and likely etiologies, options for evaluation and workup, limitations of telemedicine visit vs in person visit, treatment, treatment risks and precautions.   I discussed the assessment and treatment plan with the patient. The patient was provided an opportunity to ask questions and all were answered. The patient agreed with the plan and demonstrated an understanding of the instructions.    Time 20 minutes  Delorise Jackson, MD

## 2020-11-21 ENCOUNTER — Encounter: Payer: Self-pay | Admitting: Internal Medicine

## 2020-11-21 ENCOUNTER — Other Ambulatory Visit: Payer: Self-pay | Admitting: Internal Medicine

## 2020-11-21 DIAGNOSIS — R110 Nausea: Secondary | ICD-10-CM

## 2020-11-21 MED ORDER — ONDANSETRON HCL 4 MG PO TABS: 4.0000 mg | ORAL_TABLET | Freq: Three times a day (TID) | ORAL | 0 refills | Status: DC | PRN

## 2020-11-26 ENCOUNTER — Telehealth: Payer: Self-pay

## 2020-11-26 NOTE — Telephone Encounter (Signed)
Faxed note from Dr [facility] to [facility] to call pt to reschedule appt for suspected glaucoma.

## 2020-11-28 ENCOUNTER — Encounter: Payer: Self-pay | Admitting: Internal Medicine

## 2020-12-03 ENCOUNTER — Ambulatory Visit: Payer: 59 | Admitting: Internal Medicine

## 2020-12-04 ENCOUNTER — Telehealth: Payer: Self-pay | Admitting: Internal Medicine

## 2020-12-04 ENCOUNTER — Other Ambulatory Visit: Payer: Self-pay | Admitting: Internal Medicine

## 2020-12-04 DIAGNOSIS — Z6841 Body Mass Index (BMI) 40.0 and over, adult: Secondary | ICD-10-CM

## 2020-12-04 NOTE — Telephone Encounter (Signed)
With BP being high and h/a we need to take a break from adipex Does she want to try another option I.e wegovy have her read about this and let me know its injection causes her to feel full may cause nausea/vomiting for weight loss but will not raise blood pressure

## 2020-12-05 NOTE — Telephone Encounter (Signed)
Sent to Patient via initial Patient message encounter

## 2020-12-22 ENCOUNTER — Telehealth: Payer: Self-pay | Admitting: Internal Medicine

## 2020-12-22 ENCOUNTER — Other Ambulatory Visit: Payer: Self-pay | Admitting: Internal Medicine

## 2020-12-22 NOTE — Telephone Encounter (Signed)
2nd request adipex/wt loss medication which can elevate BP  -->With BP being high and h/a we need to take a break from adipex Does she want to try another option I.e wegovy have her read about this and let me know its injection causes her to feel full may cause nausea/vomiting for weight loss but will not raise blood pressure

## 2020-12-23 ENCOUNTER — Other Ambulatory Visit: Payer: Self-pay | Admitting: Internal Medicine

## 2020-12-23 MED ORDER — WEGOVY 2.4 MG/0.75ML ~~LOC~~ SOAJ: 2.4000 mg | SUBCUTANEOUS | 0 refills | Status: DC

## 2020-12-23 MED ORDER — WEGOVY 1 MG/0.5ML ~~LOC~~ SOAJ: 1.0000 mg | SUBCUTANEOUS | 0 refills | Status: DC

## 2020-12-23 MED ORDER — WEGOVY 0.25 MG/0.5ML ~~LOC~~ SOAJ: 0.2500 mg | SUBCUTANEOUS | 0 refills | Status: DC

## 2020-12-23 MED ORDER — WEGOVY 0.5 MG/0.5ML ~~LOC~~ SOAJ: 0.5000 mg | SUBCUTANEOUS | 0 refills | Status: DC

## 2020-12-23 MED ORDER — WEGOVY 1.7 MG/0.75ML ~~LOC~~ SOAJ: 1.7000 mg | SUBCUTANEOUS | 0 refills | Status: DC

## 2020-12-23 MED ORDER — PEN NEEDLES 30G X 8 MM MISC: 1.0000 | 5 refills | Status: DC

## 2020-12-23 NOTE — Addendum Note (Signed)
Addended by: Orland Mustard on: 12/23/2020 12:48 PM   Modules accepted: Orders

## 2020-12-23 NOTE — Telephone Encounter (Signed)
Sch nurse visit for wegovy 1st dose and instruction please   If needs PA adipex can raise BP and cause h/a

## 2020-12-23 NOTE — Telephone Encounter (Signed)
Spoke with pt and she stated that she would like to try the [facility].

## 2020-12-23 NOTE — Telephone Encounter (Signed)
I spoke with patient & let her know that [facility] was sent. I told her that she could pick up from pharmacy unless PA was needed. I told her if it was then Yves Dill would do so, but may take [place] time. I told her if she is able to get medication then to please call back & documentation in chart the schedule [place].

## 2020-12-24 ENCOUNTER — Ambulatory Visit: Payer: 59 | Admitting: Neurology

## 2020-12-24 ENCOUNTER — Encounter: Payer: Self-pay | Admitting: Neurology

## 2020-12-24 VITALS — BP 122/86 | HR 114 | Ht 63.0 in | Wt 229.0 lb

## 2020-12-24 DIAGNOSIS — R351 Nocturia: Secondary | ICD-10-CM

## 2020-12-24 DIAGNOSIS — R519 Headache, unspecified: Secondary | ICD-10-CM | POA: Diagnosis not present

## 2020-12-24 DIAGNOSIS — R0683 Snoring: Secondary | ICD-10-CM | POA: Diagnosis not present

## 2020-12-24 DIAGNOSIS — G4719 Other hypersomnia: Secondary | ICD-10-CM | POA: Diagnosis not present

## 2020-12-24 DIAGNOSIS — Z820 Family history of epilepsy and other diseases of the nervous system: Secondary | ICD-10-CM

## 2020-12-24 NOTE — Progress Notes (Signed)
Subjective:    Patient ID: Valerie Morales is a 45 y.o. female.  HPI     Star Age, MD, PhD [facility] [address] [place],  [address]  Dear Dr. Terese Door,    I saw your patient, Bahar Morales, upon your kind request in my sleep clinic today for initial consultation of her sleep disorder, in particular, concern for underlying obstructive sleep apnea.  The patient is unaccompanied today.  As you know, [name] is a 45 year old right-handed woman with an underlying medical history of hypertension, sinusitis, vitamin D deficiency, depression, anxiety, anemia and obesity, who reports snoring and excessive daytime somnolence as well as recurrent headaches including morning headaches.  I reviewed your video visit note from 11/20/2020.  Her Epworth sleepiness score is 7 out of 24.  She reports that lately her headaches have not been as severe.  She has had migrainous headaches including light sensitivity, and nausea, throbbing headache, it was more generalized typically.  She does occasionally wake up with a headache.  She takes Tylenol as needed.  She has a family history of sleep apnea, she believes that her mom had sleep apnea and had a CPAP machine but mom died young at the age of 23 from cancer.  Patient had recent surgery to the left ankle, she has been put in her left ankle boot again for additional issues with her Achilles tendon.  She works as a Science writer for [employer]  She is married and lives with her husband, her oldest daughter lives with him as well as her youngest son.  Her middle son is on his own.  She wonders if her 15 year old daughter has sleep apnea.  Patient has difficulty maintaining sleep.  This is not a new problem.  She typically goes to sleep okay, bedtime is around 10 PM and rise time between 5 and 5:30 AM.  She may wake up around 2:30 AM and may stay awake for up to a couple of hours.  She does snore, she has not  had any witnessed apneas as far she knows, she watches TV at night and has the TV on typically on low volume all night.  They have a small dog in the household.  Dog does not sleep in their bedroom.  She is working on weight loss and has lost about 30 pounds in the past year.  She does not drink caffeine on a daily basis, likes to drink 1 cup of hot tea in the morning.  She does drink alcohol on a fairly regular basis in the form of beer, 1 or 2/day on average.  She is a non-smoker.  She has started taking melatonin as needed, not sure of the dose, these are gummy form.    Her Past Medical History Is Significant For: Past Medical History:  Diagnosis Date  . Anemia    has required 2 units PRBC  . Anxiety   . Chicken pox   . Depression    trazadone has not helped in the past   . Fatty liver   . Headache   . History of blood transfusion 2009   [place]  . Hypertension   . MRSA (methicillin resistant Staphylococcus aureus)   . Sinusitis   . Sinusitis, chronic   . UTI (urinary tract infection)   . Vitamin D deficiency     Her Past Surgical History Is Significant For: Past Surgical History:  Procedure Laterality Date  . ANKLE ARTHROSCOPY  Left 05/09/2020   Procedure: ANKLE ARTHROSCOPY OCD REPAIR LEFT;  Surgeon: Samara Deist, DPM;  Location: [facility] ORS;  Service: Podiatry;  Laterality: Left;  . CESAREAN SECTION     x3 [place]  . CHOLECYSTECTOMY  1998  . COLONOSCOPY WITH PROPOFOL N/A 05/01/2019   Procedure: COLONOSCOPY WITH PROPOFOL;  Surgeon: Lucilla Lame, MD;  Location: [facility] ENDOSCOPY;  Service: Endoscopy;  Laterality: N/A;  . ESOPHAGOGASTRODUODENOSCOPY (EGD) WITH PROPOFOL N/A 05/01/2019   Procedure: ESOPHAGOGASTRODUODENOSCOPY (EGD) WITH PROPOFOL;  Surgeon: Lucilla Lame, MD;  Location: [facility] ENDOSCOPY;  Service: Endoscopy;  Laterality: N/A;  . 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 NASAL SINUS SURGERY     2009/2010  . OSTECTOMY Left 05/09/2020    Procedure: EVANS/MCDO LEFT;  Surgeon: Samara Deist, DPM;  Location: [facility] ORS;  Service: Podiatry;  Laterality: Left;  . TENDON TRANSFER Left 05/09/2020   Procedure: FDL TRANSFER;DEEP LEFT;  Surgeon: Samara Deist, DPM;  Location: [facility] ORS;  Service: Podiatry;  Laterality: Left;  . TUBAL LIGATION    . uterine ablation  2010  . WOUND DEBRIDEMENT Left 05/09/2020   Procedure: A-SCOPE/DEBRIDEMENT/EXTENSIVE LEFT;  Surgeon: Samara Deist, DPM;  Location: [facility] ORS;  Service: Podiatry;  Laterality: Left;    Her Family History Is Significant For: Family History  Problem Relation Age of Onset  . Cancer Mother 27       lung cancer-small cell lung cancer   . Heart disease Mother        MI with stents  . Hypertension Mother   . Diabetes Mother   . COPD Mother   . Hypertension Father   . Hyperlipidemia Father   . Cancer Maternal Aunt        breast cancer  . Breast cancer Maternal Aunt   . Cancer Maternal Uncle        colon cancer  . Mental illness Son   . Alcohol abuse Maternal Aunt        Liver Failure  . Alcohol abuse Maternal Grandfather   . Heart disease Maternal Grandfather   . Arthritis Paternal Grandmother   . Cancer Paternal Grandmother        leukemia   . Hypertension Paternal Grandmother   . Breast cancer Paternal Grandmother   . Cancer Maternal Grandmother        brain cancer   . Heart disease Paternal Grandfather   . Hypertension Paternal Grandfather     Her Social History Is Significant For: Social History   Socioeconomic History  . Marital status: Married    Spouse name: [name]  . Number of children: 3  . Years of education: 87  . Highest education level: Not on file  Occupational History  . Occupation: caseworker  Tobacco Use  . Smoking status: Never Smoker  . Smokeless tobacco: Never Used  Vaping Use  . Vaping Use: Never used  Substance and Sexual Activity  . Alcohol use: Yes    Comment: 2 DRINK WEEKLY  . Drug use: Not Currently    Types: Marijuana     Comment: 17 YEARS AGO  . Sexual activity: Yes    Birth control/protection: None, Surgical    Comment: tubal ligation  Other Topics Concern  . Not on file  Social History Narrative   Tyshawn "[name]" grew up in [place], [place]. She attended [employer] and obtained her Bachelors in [employer]. She lives in [place] with her 3 children and her husband  Caffeine - 2 coffee, no sodas   Exercise - not currently   Bachelors degree    Caseworker    4 pregnancies, 3 live births    Feels safe in relationship    Wears seatbelt   No guns       Social Determinants of Health   Financial Resource Strain: Not on file  Food Insecurity: Not on file  Transportation Needs: Not on file  Physical Activity: Not on file  Stress: Not on file  Social Connections: Not on file    Her Allergies Are:  Allergies  Allergen Reactions  . Other Hives     Nut allergy . Walnuts and hazelnuts are the worst.   . Penicillins Itching    Hives   . Percocet [Oxycodone-Acetaminophen] Hives    Hives   . Ambien [Zolpidem]     Hallucinations falls  . Lipitor [Atorvastatin]     Unable to tolerate h/o fatty liver with elevated lfts   . Aspirin Itching    hives  . Latex Rash    Hives .   :   Her Current Medications Are:  Outpatient Encounter Medications as of 12/24/2020  Medication Sig  . acetaminophen (TYLENOL) 650 MG CR tablet   . amLODipine (NORVASC) 5 MG tablet Take 1 tablet (5 mg total) by mouth daily.  . cetirizine (ZYRTEC) 10 MG tablet Take 10 mg by mouth daily.  . diphenhydrAMINE (BENADRYL) 25 MG tablet Take 25 mg by mouth daily as needed for allergies.   [name] Kitchen etodolac (LODINE) 400 MG tablet Take by mouth.  . Insulin Pen Needle (PEN NEEDLES) 30G X 8 MM MISC 1 Device by Does not apply route once a week.  [name] Kitchen LORazepam (ATIVAN) 0.5 MG tablet Take 1 tablet (0.5 mg total) by mouth daily as needed for anxiety.  . Multiple Vitamins-Minerals (MULTIVITAMIN WITH MINERALS) tablet Take 1 tablet by mouth  daily. Woman  . olmesartan-hydrochlorothiazide (BENICAR HCT) 40-25 MG tablet Take 1 tablet by mouth daily.  . ondansetron (ZOFRAN) 4 MG tablet Take 1 tablet (4 mg total) by mouth every 8 (eight) hours as needed for nausea or vomiting.  . Semaglutide-Weight Management (WEGOVY) 1 MG/0.5ML SOAJ Inject 1 mg into the skin once a week. Week 9-12  . tiZANidine (ZANAFLEX) 4 MG capsule Take 1 capsule (4 mg total) by mouth at bedtime as needed for muscle spasms.  [name] Kitchen venlafaxine XR (EFFEXOR-XR) 150 MG 24 hr capsule Take 1 capsule (150 mg total) by mouth daily with breakfast.  . Semaglutide-Weight Management (WEGOVY) 0.25 MG/0.5ML SOAJ Inject 0.25 mg into the skin once a week. Week 1-4 (Patient not taking: Reported on 12/24/2020)  . Semaglutide-Weight Management (WEGOVY) 0.5 MG/0.5ML SOAJ Inject 0.5 mg into the skin once a week. Week 5-8 (Patient not taking: Reported on 12/24/2020)  . Semaglutide-Weight Management (WEGOVY) 1.7 MG/0.75ML SOAJ Inject 1.7 mg into the skin once a week. Week 13-16 (Patient not taking: Reported on 12/24/2020)  . Semaglutide-Weight Management (WEGOVY) 2.4 MG/0.75ML SOAJ Inject 2.4 mg into the skin once a week. Week 17 and after (Patient not taking: Reported on 12/24/2020)  . [DISCONTINUED] azithromycin (ZITHROMAX) 250 MG tablet 2 pills day 1 and 1 pill day 2-5 with food (Patient not taking: Reported on 12/24/2020)  . [DISCONTINUED] potassium chloride SA (KLOR-CON) 20 MEQ tablet Take 1 tablet (20 mEq total) by mouth 2 (two) times daily. (Patient not taking: Reported on 12/24/2020)   No facility-administered encounter medications on file as of 12/24/2020.  :   Review of  Systems:  Out of a complete 14 point review of systems, all are reviewed and negative with the exception of these symptoms as listed below:  Review of Systems  Neurological:       Here for sleep consult. No prior sleep consult, reports snoring is present. Struggles with h/a some.  Epworth Sleepiness Scale 0= would never doze 1=  slight chance of dozing 2= moderate chance of dozing 3= high chance of dozing  Sitting and reading:0 Watching TV:2 Sitting inactive in a public place (ex. Theater or meeting):1 As a passenger in a car for an hour without a break:1 Lying down to rest in the afternoon:3 Sitting and talking to someone:0 Sitting quietly after lunch (no alcohol):0 In a car, while stopped in traffic:0 Total:7     Objective:  Neurological Exam  Physical Exam Physical Examination:   Vitals:   12/24/20 1517  BP: 122/86  Pulse: (!) 114  SpO2: 95%    General Examination: The patient is a very pleasant 45 y.o. female in no acute distress. She appears well-developed and well-nourished and well groomed.   HEENT: Normocephalic, atraumatic, pupils are equal, round and reactive to light, extraocular tracking is good without limitation to gaze excursion or nystagmus noted. Hearing is grossly intact. Face is symmetric with normal facial animation. Speech is clear with no dysarthria noted. There is no hypophonia. There is no lip, neck/head, jaw or voice tremor. Neck is supple with full range of passive and active motion. There are no carotid bruits on auscultation. Oropharynx exam reveals: mild mouth dryness, adequate dental hygiene and mild airway crowding, due to smaller airway entry.  Tonsils on the smaller side, 1+ bilaterally.  Tongue protrudes centrally in palate elevates symmetrically, Mallampati class II.  Neck circumference of 14 5/8 inches.  She has a very minimal overbite.  Nasal inspection reveals no significant nasal crowding or narrow nasal passages or deviated septum.   Chest: Clear to auscultation without wheezing, rhonchi or crackles noted.  Heart: S1+S2+0, regular and normal without murmurs, rubs or gallops noted.   Abdomen: Soft, non-tender and non-distended with normal bowel sounds appreciated on auscultation.  Extremities: There is no pitting edema in the right distal lower extremity, left  foot in the boot which we did not remove.   Skin: Warm and dry without trophic changes noted.   Musculoskeletal: exam reveals no obvious joint deformities, tenderness or joint swelling or erythema.   Neurologically:  Mental status: The patient is awake, alert and oriented in all 4 spheres. Her immediate and remote memory, attention, language skills and fund of knowledge are appropriate. There is no evidence of aphasia, agnosia, apraxia or anomia. Speech is clear with normal prosody and enunciation. Thought process is linear. Mood is normal and affect is normal.  Cranial nerves II - XII are as described above under HEENT exam.  Motor exam: Normal bulk, strength and tone is noted. There is no tremor, fine motor skills and coordination: grossly intact.  Cerebellar testing: No dysmetria or intention tremor. There is no truncal or gait ataxia.  Sensory exam: intact to light touch in the upper and lower extremities.  Gait, station and balance: She stands without difficulty.  She walks without a walking aid.  She has a slight limp on the left side owing to the boot.  Assessment and Plan:  In summary, JORGIA MANTHEI is a very pleasant 45 y.o.-year old female with an underlying medical history of hypertension, sinusitis, vitamin D deficiency, depression, anxiety, anemia and obesity,  whose history and physical exam are concerning for obstructive sleep apnea (OSA). I had a long chat with the patient about my findings and the diagnosis of OSA, its prognosis and treatment options. We talked about medical treatments, surgical interventions and non-pharmacological approaches. I explained in particular the risks and ramifications of untreated moderate to severe OSA, especially with respect to developing cardiovascular disease down the Road, including congestive heart failure, difficult to treat hypertension, cardiac arrhythmias, or stroke. Even type 2 diabetes has, in part, been linked to untreated OSA.  Symptoms of untreated OSA include daytime sleepiness, memory problems, mood irritability and mood disorder such as depression and anxiety, lack of energy, as well as recurrent headaches, especially morning headaches. We talked about trying to maintain a healthy lifestyle in general, as well as the importance of weight control. We also talked about the importance of good sleep hygiene. I recommended the following at this time: sleep study.   I explained the sleep test procedure to the patient and also outlined possible surgical and non-surgical treatment options of OSA, including the use of a custom-made dental device (which would require a referral to a specialist dentist or oral surgeon), upper airway surgical options, such as traditional UPPP or a novel less invasive surgical option in the form of Inspire hypoglossal nerve stimulation (which would involve a referral to an ENT surgeon). I also explained the CPAP treatment option to the patient, who indicated that she would be willing to try CPAP if the need arises. I explained the importance of being compliant with PAP treatment, not only for insurance purposes but primarily to improve Her symptoms, and for the patient's long term health benefit, including to reduce Her cardiovascular risks. I answered all her questions today and the patient was in agreement. I plan to see her back after the sleep study is completed and encouraged her to call with any interim questions, concerns, problems or updates.   Thank you very much for allowing me to participate in the care of this nice patient. If I can be of any further assistance to you please do not hesitate to call me at [phone].  Sincerely,   Star Age, MD, PhD

## 2020-12-24 NOTE — Patient Instructions (Signed)

## 2020-12-25 ENCOUNTER — Telehealth: Payer: Self-pay | Admitting: Internal Medicine

## 2020-12-25 NOTE — Telephone Encounter (Signed)
Patient called in wanted to know if the authorization has come in for the new medication

## 2020-12-26 NOTE — Telephone Encounter (Signed)
Have you received a [facility] request for this Patient?

## 2020-12-26 NOTE — Telephone Encounter (Signed)
No I have not

## 2020-12-26 NOTE — Telephone Encounter (Signed)
-----   Message from Delorise Jackson, MD sent at 12/26/2020  3:49 PM EST ----- She has been on adipex caused h/a and HTN

## 2020-12-27 ENCOUNTER — Other Ambulatory Visit: Payer: Self-pay | Admitting: Internal Medicine

## 2020-12-27 DIAGNOSIS — I10 Essential (primary) hypertension: Secondary | ICD-10-CM

## 2020-12-29 ENCOUNTER — Telehealth: Payer: Self-pay

## 2020-12-29 NOTE — Telephone Encounter (Signed)
LVM for pt to call me back to schedule sleep study  

## 2020-12-29 NOTE — Telephone Encounter (Signed)
Prior authorization has been submitted for patient's [facility].   Awaiting approval or denial.   Patient informed and verbalized understanding.

## 2020-12-31 NOTE — Telephone Encounter (Signed)
Appeal sent in, awaiting approval or denial.

## 2020-12-31 NOTE — Telephone Encounter (Signed)
Prior authorization has been denied.   Why was my request denied? This request was denied because you did not meet the following clinical requirements: The requested medication and/or diagnosis are not a covered benefit and excluded from coverage in accordance with the terms and conditions of your plan benefit. Therefore, the request has been administratively denied. The requested medication and/or diagnosis are not a covered benefit and are excluded from coverage in accordance with the terms and conditions of your plan benefit. Therefore, this request has been administratively denied. The reason(s) [employer] did not approve this medication can be found above. This denial is based on our [facility] drug coverage policy, in addition to any supplementary information you or your prescriber may have submitted.

## 2021-01-01 ENCOUNTER — Emergency Department: Payer: 59

## 2021-01-01 ENCOUNTER — Encounter: Payer: Self-pay | Admitting: Emergency Medicine

## 2021-01-01 ENCOUNTER — Emergency Department
Admission: EM | Admit: 2021-01-01 | Discharge: 2021-01-01 | Disposition: A | Discharge status: Home / Self Care | Payer: 59 | Attending: Emergency Medicine | Admitting: Emergency Medicine

## 2021-01-01 ENCOUNTER — Other Ambulatory Visit: Payer: Self-pay

## 2021-01-01 DIAGNOSIS — K529 Noninfective gastroenteritis and colitis, unspecified: Secondary | ICD-10-CM | POA: Insufficient documentation

## 2021-01-01 DIAGNOSIS — I10 Essential (primary) hypertension: Secondary | ICD-10-CM | POA: Diagnosis not present

## 2021-01-01 DIAGNOSIS — Z79899 Other long term (current) drug therapy: Secondary | ICD-10-CM | POA: Insufficient documentation

## 2021-01-01 DIAGNOSIS — Z91040 Latex allergy status: Secondary | ICD-10-CM | POA: Insufficient documentation

## 2021-01-01 DIAGNOSIS — R1084 Generalized abdominal pain: Secondary | ICD-10-CM | POA: Diagnosis present

## 2021-01-01 LAB — COMPREHENSIVE METABOLIC PANEL
ALT: 74 U/L — ABNORMAL HIGH (ref 0–44)
AST: 142 U/L — ABNORMAL HIGH (ref 15–41)
Albumin: 4.4 g/dL (ref 3.5–5.0)
Alkaline Phosphatase: 97 U/L (ref 38–126)
Anion gap: 17 — ABNORMAL HIGH (ref 5–15)
BUN: 8 mg/dL (ref 6–20)
CO2: 19 mmol/L — ABNORMAL LOW (ref 22–32)
Calcium: 9.1 mg/dL (ref 8.9–10.3)
Chloride: 90 mmol/L — ABNORMAL LOW (ref 98–111)
Creatinine, Ser: 1.35 mg/dL — ABNORMAL HIGH (ref 0.44–1.00)
GFR, Estimated: 49 mL/min — ABNORMAL LOW (ref >60)
Glucose, Bld: 143 mg/dL — ABNORMAL HIGH (ref 70–99)
Potassium: 3.3 mmol/L — ABNORMAL LOW (ref 3.5–5.1)
Sodium: 126 mmol/L — ABNORMAL LOW (ref 135–145)
Total Bilirubin: 1.2 mg/dL (ref 0.3–1.2)
Total Protein: 7.9 g/dL (ref 6.5–8.1)

## 2021-01-01 LAB — URINALYSIS, COMPLETE (UACMP) WITH MICROSCOPIC
Bilirubin Urine: NEGATIVE
Glucose, UA: NEGATIVE mg/dL
Hgb urine dipstick: NEGATIVE
Ketones, ur: NEGATIVE mg/dL
Leukocytes,Ua: NEGATIVE
Nitrite: NEGATIVE
Protein, ur: NEGATIVE mg/dL
Specific Gravity, Urine: 1.003 — ABNORMAL LOW (ref 1.005–1.030)
Squamous Epithelial / HPF: NONE SEEN (ref 0–5)
pH: 6 (ref 5.0–8.0)

## 2021-01-01 LAB — CBC
HCT: 38.9 % (ref 36.0–46.0)
Hemoglobin: 13.8 g/dL (ref 12.0–15.0)
MCH: 33.4 pg (ref 26.0–34.0)
MCHC: 35.5 g/dL (ref 30.0–36.0)
MCV: 94.2 fL (ref 80.0–100.0)
Platelets: 358 10*3/uL (ref 150–400)
RBC: 4.13 MIL/uL (ref 3.87–5.11)
RDW: 14.7 % (ref 11.5–15.5)
WBC: 11.4 10*3/uL — ABNORMAL HIGH (ref 4.0–10.5)
nRBC: 0 % (ref 0.0–0.2)

## 2021-01-01 LAB — POC URINE PREG, ED: Preg Test, Ur: NEGATIVE

## 2021-01-01 LAB — HCG, QUANTITATIVE, PREGNANCY: hCG, Beta Chain, Quant, S: <1 m[IU]/mL (ref <5)

## 2021-01-01 LAB — LIPASE, BLOOD: Lipase: 51 U/L (ref 11–51)

## 2021-01-01 MED ORDER — SODIUM CHLORIDE 0.9 % IV SOLN
1000.0000 mL | Freq: Once | INTRAVENOUS | Status: AC
  Administered 2021-01-01: 1000 mL via INTRAVENOUS

## 2021-01-01 MED ORDER — FENTANYL CITRATE (PF) 100 MCG/2ML IJ SOLN
25.0000 ug | Freq: Once | INTRAMUSCULAR | Status: AC
Start: 2021-01-01 — End: 2021-01-01
  Administered 2021-01-01: 25 ug via INTRAVENOUS
  Filled 2021-01-01: qty 2

## 2021-01-01 MED ORDER — ONDANSETRON HCL 4 MG/2ML IJ SOLN
4.0000 mg | Freq: Once | INTRAMUSCULAR | Status: AC
  Administered 2021-01-01: 4 mg via INTRAVENOUS
  Filled 2021-01-01: qty 2

## 2021-01-01 MED ORDER — IOHEXOL 300 MG/ML  SOLN
125.0000 mL | Freq: Once | INTRAMUSCULAR | Status: AC | PRN
  Administered 2021-01-01: 125 mL via INTRAVENOUS

## 2021-01-01 NOTE — ED Triage Notes (Signed)
Pt comes into the ED via POV c/o anxiety, N/V, and overall not feeling "well".  Pt very tearful in triage and states she hasnt been feeling well for a couple days.  Pt states she was seen at [facility] but they did not get any blood work.  Pt does admit to having anxiety and explains it is worse right now because she doesn't feel good.

## 2021-01-01 NOTE — ED Notes (Signed)
First RN Note: pt to ED via POV, pt's SO reports pt having a panic attack. Pt noted to be tearful on arrival to ED, pt also noted to be hyperventilating at this time. Pt placed in wheelchair.

## 2021-01-01 NOTE — ED Provider Notes (Signed)
[facility] Emergency Department Provider Note   ____________________________________________    I have reviewed the triage vital signs and the nursing notes.   HISTORY  Chief Complaint Anxiety, Abdominal Pain, and Emesis     HPI Valerie Morales is a 45 y.o. female with history of cholecystectomy, gastric bypass, who presents with complaints of abdominal pain, nausea vomiting and diarrhea.  Patient reports over the last 3 days she has had nausea vomiting diarrhea, decreased p.o. intake, complains of diffuse abdominal pain which started yesterday.  Watery stools.  Not tolerating p.o.'s.  No sick contacts reported.  Has tried taking Zofran at home with little improvement.  Feels weak.  Past Medical History:  Diagnosis Date  . Anemia    has required 2 units PRBC  . Anxiety   . Chicken pox   . Depression    trazadone has not helped in the past   . Fatty liver   . Headache   . History of blood transfusion 2009   [place]  . Hypertension   . MRSA (methicillin resistant Staphylococcus aureus)   . Sinusitis   . Sinusitis, chronic   . UTI (urinary tract infection)   . Vitamin D deficiency     Patient Active Problem List   Diagnosis Date Noted  . Sinus tachycardia 05/28/2020  . S/P foot surgery, left 05/28/2020  . Fatty liver 02/07/2020  . Morbid obesity with BMI of 40.0-44.9, adult ([place]) 01/24/2020  . Sinus tarsi syndrome of left ankle 01/24/2020  . Prediabetes 07/25/2019  . Allergic rhinitis 08/11/2018  . Chronic sinusitis 08/11/2018  . Vitamin D deficiency 04/11/2018  . Abnormal mammogram of left breast 04/11/2018  . Plantar fasciitis 01/09/2018  . Essential hypertension 12/18/2017  . Anxiety and depression 12/18/2017  . 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 Anemia 12/15/2017  . Pelvic pain 12/15/2017  . Breast pain 12/15/2017  . Chronic pain of left ankle 12/15/2017  . Morbid obesity ([place]) 12/15/2017  . Annual physical exam  01/02/2014  . Vaginal discharge 01/02/2014  . Fatigue 12/21/2013  . Hot flashes 12/21/2013  . Sleep disturbance 12/21/2013    Past Surgical History:  Procedure Laterality Date  . ANKLE ARTHROSCOPY Left 05/09/2020   Procedure: ANKLE ARTHROSCOPY OCD REPAIR LEFT;  Surgeon: Samara Deist, DPM;  Location: [facility] ORS;  Service: Podiatry;  Laterality: Left;  . CESAREAN SECTION     x3 [place]  . CHOLECYSTECTOMY  1998  . COLONOSCOPY WITH PROPOFOL N/A 05/01/2019   Procedure: COLONOSCOPY WITH PROPOFOL;  Surgeon: Lucilla Lame, MD;  Location: [facility] ENDOSCOPY;  Service: Endoscopy;  Laterality: N/A;  . ESOPHAGOGASTRODUODENOSCOPY (EGD) WITH PROPOFOL N/A 05/01/2019   Procedure: ESOPHAGOGASTRODUODENOSCOPY (EGD) WITH PROPOFOL;  Surgeon: Lucilla Lame, MD;  Location: [facility] ENDOSCOPY;  Service: Endoscopy;  Laterality: N/A;  . 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 in [facility]   . NASAL SINUS SURGERY     2009/2010  . OSTECTOMY Left 05/09/2020   Procedure: EVANS/MCDO LEFT;  Surgeon: Samara Deist, DPM;  Location: [facility] ORS;  Service: Podiatry;  Laterality: Left;  . TENDON TRANSFER Left 05/09/2020   Procedure: FDL TRANSFER;DEEP LEFT;  Surgeon: Samara Deist, DPM;  Location: [facility] ORS;  Service: Podiatry;  Laterality: Left;  . TUBAL LIGATION    . uterine ablation  2010  . WOUND DEBRIDEMENT Left 05/09/2020   Procedure: A-SCOPE/DEBRIDEMENT/EXTENSIVE LEFT;  Surgeon: Samara Deist, DPM;  Location: [facility] ORS;  Service: Podiatry;  Laterality: Left;    Prior  to Admission medications   Medication Sig Start Date End Date Taking? Authorizing Provider  acetaminophen (TYLENOL) 650 MG CR tablet  05/18/20   [provider]  amLODipine (NORVASC) 5 MG tablet Take 1 tablet (5 mg total) by mouth daily. 11/20/20   McLean-Scocuzza, Nino Glow, MD  cetirizine (ZYRTEC) 10 MG tablet Take 10 mg by mouth daily.    [provider]  diphenhydrAMINE (BENADRYL) 25 MG tablet Take 25 mg by mouth daily as needed for  allergies.     [provider]  etodolac (LODINE) 400 MG tablet Take by mouth. 01/17/20 08/28/21  [provider]  Insulin Pen Needle (PEN NEEDLES) 30G X 8 MM MISC 1 Device by Does not apply route once a week. 12/23/20   McLean-Scocuzza, Nino Glow, MD  LORazepam (ATIVAN) 0.5 MG tablet Take 1 tablet (0.5 mg total) by mouth daily as needed for anxiety. 11/20/20   McLean-Scocuzza, Nino Glow, MD  Multiple Vitamins-Minerals (MULTIVITAMIN WITH MINERALS) tablet Take 1 tablet by mouth daily. Woman    [provider]  olmesartan-hydrochlorothiazide (BENICAR HCT) 40-25 MG tablet Take 1 tablet by mouth daily. 02/07/20   McLean-Scocuzza, Nino Glow, MD  ondansetron (ZOFRAN) 4 MG tablet Take 1 tablet (4 mg total) by mouth every 8 (eight) hours as needed for nausea or vomiting. 11/21/20   McLean-Scocuzza, Nino Glow, MD  Semaglutide-Weight Management (WEGOVY) 0.25 MG/0.5ML SOAJ Inject 0.25 mg into the skin once a week. Week 1-4 Patient not taking: Reported on 12/24/2020 12/23/20   McLean-Scocuzza, Nino Glow, MD  Semaglutide-Weight Management [facility]) 0.5 MG/0.5ML SOAJ Inject 0.5 mg into the skin once a week. Week 5-8 Patient not taking: Reported on 12/24/2020 12/23/20   McLean-Scocuzza, Nino Glow, MD  Semaglutide-Weight Management [facility]) 1 MG/0.5ML SOAJ Inject 1 mg into the skin once a week. Week 9-12 12/23/20   McLean-Scocuzza, Nino Glow, MD  Semaglutide-Weight Management (WEGOVY) 1.7 MG/0.75ML SOAJ Inject 1.7 mg into the skin once a week. Week 13-16 Patient not taking: Reported on 12/24/2020 12/23/20   McLean-Scocuzza, Nino Glow, MD  Semaglutide-Weight Management (WEGOVY) 2.4 MG/0.75ML SOAJ Inject 2.4 mg into the skin once a week. Week 17 and after Patient not taking: Reported on 12/24/2020 12/23/20   McLean-Scocuzza, Nino Glow, MD  tiZANidine (ZANAFLEX) 4 MG capsule Take 1 capsule (4 mg total) by mouth at bedtime as needed for muscle spasms. 11/20/20   McLean-Scocuzza, Nino Glow, MD  venlafaxine XR (EFFEXOR-XR) 150 MG 24 hr capsule  Take 1 capsule (150 mg total) by mouth daily with breakfast. 01/24/20   McLean-Scocuzza, Nino Glow, MD     Allergies Other, Penicillins, Percocet [oxycodone-acetaminophen], Ambien [zolpidem], Lipitor [atorvastatin], Aspirin, and Latex  Family History  Problem Relation Age of Onset  . Cancer Mother 55       lung cancer-small cell lung cancer   . Heart disease Mother        MI with stents  . Hypertension Mother   . Diabetes Mother   . COPD Mother   . Hypertension Father   . Hyperlipidemia Father   . Cancer Maternal Aunt        breast cancer  . Breast cancer Maternal Aunt   . Cancer Maternal Uncle        colon cancer  . Mental illness Son   . Alcohol abuse Maternal Aunt        Liver Failure  . Alcohol abuse Maternal Grandfather   . Heart disease Maternal Grandfather   . Arthritis Paternal Grandmother   . Cancer  Paternal Grandmother        leukemia   . Hypertension Paternal Grandmother   . Breast cancer Paternal Grandmother   . Cancer Maternal Grandmother        brain cancer   . Heart disease Paternal Grandfather   . Hypertension Paternal Grandfather     Social History Social History   Tobacco Use  . Smoking status: Never Smoker  . Smokeless tobacco: Never Used  Vaping Use  . Vaping Use: Never used  Substance Use Topics  . Alcohol use: Yes    Comment: 2 DRINK WEEKLY  . Drug use: Not Currently    Types: Marijuana    Comment: 17 YEARS AGO    Review of Systems  Constitutional: No fever/chills Eyes: No visual changes.  ENT: No sore throat. Cardiovascular: Denies chest pain. Respiratory: Denies shortness of breath. Gastrointestinal: As above Genitourinary: Negative for dysuria. Musculoskeletal: Negative for back pain. Skin: Negative for rash. Neurological: Negative for headaches or weakness   ____________________________________________   PHYSICAL EXAM:  VITAL SIGNS: ED Triage Vitals  Enc Vitals Group     BP 01/01/21 1522 117/79     Pulse Rate 01/01/21  1522 (!) 128     Resp 01/01/21 1522 19     Temp --      Temp src --      SpO2 01/01/21 1522 100 %     Weight 01/01/21 1455 100.7 kg (222 lb)     Height 01/01/21 1455 1.6 m (5\' 3" )     Head Circumference --      Peak Flow --      Pain Score 01/01/21 1455 8     Pain Loc --      Pain Edu? --      Excl. in [place]? --     Constitutional: Alert and oriented.  Eyes: Conjunctivae are normal.   Mouth/Throat: Mucous membranes are moist.   Neck:  Painless ROM Cardiovascular: Normal rate, regular rhythm. Grossly normal heart sounds.  Good peripheral circulation. Respiratory: Normal respiratory effort.  No retractions. Lungs CTAB. Gastrointestinal: Soft, diffuse mild tenderness, no distention.  No CVA tenderness.  Musculoskeletal: No lower extremity tenderness nor edema.  Warm and well perfused Neurologic:  Normal speech and language. No gross focal neurologic deficits are appreciated.  Skin:  Skin is warm, dry and intact. No rash noted. Psychiatric: Mood and affect are normal. Speech and behavior are normal.  ____________________________________________   LABS (all labs ordered are listed, but only abnormal results are displayed)  Labs Reviewed  COMPREHENSIVE METABOLIC PANEL - Abnormal; Notable for the following components:      Result Value   Sodium 126 (*)    Potassium 3.3 (*)    Chloride 90 (*)    CO2 19 (*)    Glucose, Bld 143 (*)    Creatinine, Ser 1.35 (*)    AST 142 (*)    ALT 74 (*)    GFR, Estimated 49 (*)    Anion gap 17 (*)    All other components within normal limits  CBC - Abnormal; Notable for the following components:   WBC 11.4 (*)    All other components within normal limits  URINALYSIS, COMPLETE (UACMP) WITH MICROSCOPIC - Abnormal; Notable for the following components:   Color, Urine STRAW (*)    APPearance CLEAR (*)    Specific Gravity, Urine 1.003 (*)    Bacteria, UA RARE (*)    All other components within normal limits  LIPASE, BLOOD  HCG, QUANTITATIVE,  PREGNANCY  POC URINE PREG, ED   ____________________________________________  EKG None ____________________________________________  RADIOLOGY CT abdomen pelvis  ____________________________________________   PROCEDURES  Procedure(s) performed: No  Procedures   Critical Care performed: No ____________________________________________   INITIAL IMPRESSION / ASSESSMENT AND PLAN / ED COURSE  Pertinent labs & imaging results that were available during my care of the patient were reviewed by me and considered in my medical decision making (see chart for details).  Patient presents with nausea vomiting diarrhea abdominal pain.  Differential includes gastroenteritis, colitis, not consistent with SBO, lipase normal, history of cholecystectomy.  Lab work notable for hyponatremia, hypokalemia bicarb of 19, mild elevation in AST and ALT, likely related to viral illness causing nausea vomiting decreased p.o. 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   Lavonia Drafts, MD 01/01/21 [phone]

## 2021-01-04 ENCOUNTER — Other Ambulatory Visit: Payer: Self-pay | Admitting: Internal Medicine

## 2021-01-04 DIAGNOSIS — I10 Essential (primary) hypertension: Secondary | ICD-10-CM

## 2021-01-21 ENCOUNTER — Ambulatory Visit (INDEPENDENT_AMBULATORY_CARE_PROVIDER_SITE_OTHER): Payer: 59 | Admitting: Neurology

## 2021-01-21 DIAGNOSIS — Z820 Family history of epilepsy and other diseases of the nervous system: Secondary | ICD-10-CM

## 2021-01-21 DIAGNOSIS — R351 Nocturia: Secondary | ICD-10-CM

## 2021-01-21 DIAGNOSIS — G4719 Other hypersomnia: Secondary | ICD-10-CM

## 2021-01-21 DIAGNOSIS — R0683 Snoring: Secondary | ICD-10-CM

## 2021-01-21 DIAGNOSIS — G4733 Obstructive sleep apnea (adult) (pediatric): Secondary | ICD-10-CM | POA: Diagnosis not present

## 2021-01-21 DIAGNOSIS — R519 Headache, unspecified: Secondary | ICD-10-CM

## 2021-01-22 ENCOUNTER — Emergency Department
Admission: EM | Admit: 2021-01-22 | Discharge: 2021-01-22 | Disposition: A | Discharge status: Home / Self Care | Payer: 59 | Attending: Emergency Medicine | Admitting: Emergency Medicine

## 2021-01-22 ENCOUNTER — Emergency Department: Payer: 59

## 2021-01-22 ENCOUNTER — Other Ambulatory Visit: Payer: Self-pay

## 2021-01-22 DIAGNOSIS — Z7984 Long term (current) use of oral hypoglycemic drugs: Secondary | ICD-10-CM | POA: Diagnosis not present

## 2021-01-22 DIAGNOSIS — R0789 Other chest pain: Secondary | ICD-10-CM | POA: Insufficient documentation

## 2021-01-22 DIAGNOSIS — R079 Chest pain, unspecified: Secondary | ICD-10-CM

## 2021-01-22 DIAGNOSIS — Z794 Long term (current) use of insulin: Secondary | ICD-10-CM | POA: Diagnosis not present

## 2021-01-22 DIAGNOSIS — R7303 Prediabetes: Secondary | ICD-10-CM | POA: Diagnosis not present

## 2021-01-22 DIAGNOSIS — R110 Nausea: Secondary | ICD-10-CM | POA: Diagnosis not present

## 2021-01-22 DIAGNOSIS — Z79899 Other long term (current) drug therapy: Secondary | ICD-10-CM | POA: Insufficient documentation

## 2021-01-22 DIAGNOSIS — I10 Essential (primary) hypertension: Secondary | ICD-10-CM | POA: Insufficient documentation

## 2021-01-22 DIAGNOSIS — Z91040 Latex allergy status: Secondary | ICD-10-CM | POA: Diagnosis not present

## 2021-01-22 LAB — CBC WITH DIFFERENTIAL/PLATELET
Abs Immature Granulocytes: 0.03 10*3/uL (ref 0.00–0.07)
Basophils Absolute: 0.1 10*3/uL (ref 0.0–0.1)
Basophils Relative: 1 %
Eosinophils Absolute: 0.3 10*3/uL (ref 0.0–0.5)
Eosinophils Relative: 4 %
HCT: 33.5 % — ABNORMAL LOW (ref 36.0–46.0)
Hemoglobin: 11.9 g/dL — ABNORMAL LOW (ref 12.0–15.0)
Immature Granulocytes: 0 %
Lymphocytes Relative: 31 %
Lymphs Abs: 2.6 10*3/uL (ref 0.7–4.0)
MCH: 33.5 pg (ref 26.0–34.0)
MCHC: 35.5 g/dL (ref 30.0–36.0)
MCV: 94.4 fL (ref 80.0–100.0)
Monocytes Absolute: 0.4 10*3/uL (ref 0.1–1.0)
Monocytes Relative: 5 %
Neutro Abs: 4.8 10*3/uL (ref 1.7–7.7)
Neutrophils Relative %: 59 %
Platelets: 235 10*3/uL (ref 150–400)
RBC: 3.55 MIL/uL — ABNORMAL LOW (ref 3.87–5.11)
RDW: 15.1 % (ref 11.5–15.5)
WBC: 8.1 10*3/uL (ref 4.0–10.5)
nRBC: 0 % (ref 0.0–0.2)

## 2021-01-22 LAB — COMPREHENSIVE METABOLIC PANEL
ALT: 38 U/L (ref 0–44)
AST: 57 U/L — ABNORMAL HIGH (ref 15–41)
Albumin: 3.9 g/dL (ref 3.5–5.0)
Alkaline Phosphatase: 80 U/L (ref 38–126)
Anion gap: 11 (ref 5–15)
BUN: 8 mg/dL (ref 6–20)
CO2: 21 mmol/L — ABNORMAL LOW (ref 22–32)
Calcium: 8.8 mg/dL — ABNORMAL LOW (ref 8.9–10.3)
Chloride: 101 mmol/L (ref 98–111)
Creatinine, Ser: 0.84 mg/dL (ref 0.44–1.00)
GFR, Estimated: >60 mL/min (ref >60)
Glucose, Bld: 90 mg/dL (ref 70–99)
Potassium: 3.1 mmol/L — ABNORMAL LOW (ref 3.5–5.1)
Sodium: 133 mmol/L — ABNORMAL LOW (ref 135–145)
Total Bilirubin: 0.8 mg/dL (ref 0.3–1.2)
Total Protein: 7.3 g/dL (ref 6.5–8.1)

## 2021-01-22 LAB — TROPONIN I (HIGH SENSITIVITY): Troponin I (High Sensitivity): 3 ng/L (ref <18)

## 2021-01-22 MED ORDER — CLOPIDOGREL BISULFATE 75 MG PO TABS
300.0000 mg | ORAL_TABLET | Freq: Once | ORAL | Status: AC
  Administered 2021-01-22: 300 mg via ORAL
  Filled 2021-01-22: qty 4

## 2021-01-22 MED ORDER — FAMOTIDINE 20 MG PO TABS: 20.0000 mg | ORAL_TABLET | Freq: Two times a day (BID) | ORAL | 1 refills | Status: DC

## 2021-01-22 MED ORDER — ALUM & MAG HYDROXIDE-SIMETH 200-200-20 MG/5ML PO SUSP
30.0000 mL | Freq: Once | ORAL | Status: AC
  Administered 2021-01-22: 30 mL via ORAL
  Filled 2021-01-22: qty 30

## 2021-01-22 MED ORDER — SUCRALFATE 1 GM/10ML PO SUSP: 1.0000 g | Freq: Four times a day (QID) | ORAL | 1 refills | Status: DC

## 2021-01-22 MED ORDER — LORAZEPAM 0.5 MG PO TABS
0.5000 mg | ORAL_TABLET | Freq: Once | ORAL | Status: AC
  Administered 2021-01-22: 0.5 mg via ORAL
  Filled 2021-01-22: qty 1

## 2021-01-22 MED ORDER — LIDOCAINE VISCOUS HCL 2 % MT SOLN
15.0000 mL | Freq: Once | OROMUCOSAL | Status: AC
  Administered 2021-01-22: 15 mL via ORAL
  Filled 2021-01-22: qty 15

## 2021-01-22 NOTE — ED Triage Notes (Signed)
Pt states she started having chest pain around 6AM- pt states that it is center of her chest and feels like a "tightening and loosening"- pt states she is having nausea- pt appears very anxious

## 2021-01-22 NOTE — ED Provider Notes (Signed)
[facility] Emergency Department Provider Note   ____________________________________________   Event Date/Time   First MD Initiated Contact with Patient 01/22/21 [id number]     (approximate)  I have reviewed the triage vital signs and the nursing notes.   HISTORY  Chief Complaint Chest Pain    HPI Valerie Morales is a 45 y.o. female with a past medical history of anxiety, depression, hypertension who presents for central chest pain that she describes as "tightening and loosening", 10/10, nonradiating, and stable since onset.  Patient denies any exacerbating or relieving factors.  Patient endorses associated nausea without vomiting.  Patient is extremely anxious rocking back and forth in bed and only gives 1 word answers to questions.  Patient currently denies any vision changes, tinnitus, difficulty speaking, facial droop, sore throat, shortness of breath, abdominal pain, nausea/vomiting/diarrhea, dysuria, or weakness/numbness/paresthesias in any extremity         Past Medical History:  Diagnosis Date  . Anemia    has required 2 units PRBC  . Anxiety   . Chicken pox   . Depression    trazadone has not helped in the past   . Fatty liver   . Headache   . History of blood transfusion 2009   [place]  . Hypertension   . MRSA (methicillin resistant Staphylococcus aureus)   . Sinusitis   . Sinusitis, chronic   . UTI (urinary tract infection)   . Vitamin D deficiency     Patient Active Problem List   Diagnosis Date Noted  . Sinus tachycardia 05/28/2020  . S/P foot surgery, left 05/28/2020  . Fatty liver 02/07/2020  . Morbid obesity with BMI of 40.0-44.9, adult ([place]) 01/24/2020  . Sinus tarsi syndrome of left ankle 01/24/2020  . Prediabetes 07/25/2019  . Allergic rhinitis 08/11/2018  . Chronic sinusitis 08/11/2018  . Vitamin D deficiency 04/11/2018  . Abnormal mammogram of left breast 04/11/2018  . Plantar fasciitis 01/09/2018  . Essential  hypertension 12/18/2017  . Anxiety and depression 12/18/2017  . 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 Anemia 12/15/2017  . Pelvic pain 12/15/2017  . Breast pain 12/15/2017  . Chronic pain of left ankle 12/15/2017  . Morbid obesity ([place]) 12/15/2017  . Annual physical exam 01/02/2014  . Vaginal discharge 01/02/2014  . Fatigue 12/21/2013  . Hot flashes 12/21/2013  . Sleep disturbance 12/21/2013    Past Surgical History:  Procedure Laterality Date  . ANKLE ARTHROSCOPY Left 05/09/2020   Procedure: ANKLE ARTHROSCOPY OCD REPAIR LEFT;  Surgeon: Samara Deist, DPM;  Location: [facility] ORS;  Service: Podiatry;  Laterality: Left;  . CESAREAN SECTION     x3 [place]  . CHOLECYSTECTOMY  1998  . COLONOSCOPY WITH PROPOFOL N/A 05/01/2019   Procedure: COLONOSCOPY WITH PROPOFOL;  Surgeon: Lucilla Lame, MD;  Location: [facility] ENDOSCOPY;  Service: Endoscopy;  Laterality: N/A;  . ESOPHAGOGASTRODUODENOSCOPY (EGD) WITH PROPOFOL N/A 05/01/2019   Procedure: ESOPHAGOGASTRODUODENOSCOPY (EGD) WITH PROPOFOL;  Surgeon: Lucilla Lame, MD;  Location: [facility] ENDOSCOPY;  Service: Endoscopy;  Laterality: N/A;  . 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 in [facility]   . NASAL SINUS SURGERY     2009/2010  . OSTECTOMY Left 05/09/2020   Procedure: EVANS/MCDO LEFT;  Surgeon: Samara Deist, DPM;  Location: [facility] ORS;  Service: Podiatry;  Laterality: Left;  . TENDON TRANSFER Left 05/09/2020   Procedure: FDL TRANSFER;DEEP LEFT;  Surgeon: Samara Deist, DPM;  Location: [facility] ORS;  Service: Podiatry;  Laterality: Left;  .  TUBAL LIGATION    . uterine ablation  2010  . WOUND DEBRIDEMENT Left 05/09/2020   Procedure: A-SCOPE/DEBRIDEMENT/EXTENSIVE LEFT;  Surgeon: Samara Deist, DPM;  Location: [facility] ORS;  Service: Podiatry;  Laterality: Left;    Prior to Admission medications   Medication Sig Start Date End Date Taking? Authorizing Provider  famotidine (PEPCID) 20 MG tablet Take 1 tablet (20 mg total) by mouth 2 (two) times  daily. 01/22/21 01/22/22 Yes Fabiana Dromgoole, Vista Lawman, MD  sucralfate (CARAFATE) 1 GM/10ML suspension Take 10 mLs (1 g total) by mouth 4 (four) times daily. 01/22/21 01/22/22 Yes Naaman Plummer, MD  acetaminophen (TYLENOL) 650 MG CR tablet  05/18/20   [provider]  amLODipine (NORVASC) 5 MG tablet Take 1 tablet by mouth once daily 01/05/21   McLean-Scocuzza, Nino Glow, MD  cetirizine (ZYRTEC) 10 MG tablet Take 10 mg by mouth daily.    [provider]  diphenhydrAMINE (BENADRYL) 25 MG tablet Take 25 mg by mouth daily as needed for allergies.     [provider]  etodolac (LODINE) 400 MG tablet Take by mouth. 01/17/20 08/28/21  [provider]  Insulin Pen Needle (PEN NEEDLES) 30G X 8 MM MISC 1 Device by Does not apply route once a week. 12/23/20   McLean-Scocuzza, Nino Glow, MD  LORazepam (ATIVAN) 0.5 MG tablet Take 1 tablet (0.5 mg total) by mouth daily as needed for anxiety. 11/20/20   McLean-Scocuzza, Nino Glow, MD  Multiple Vitamins-Minerals (MULTIVITAMIN WITH MINERALS) tablet Take 1 tablet by mouth daily. Woman    [provider]  olmesartan-hydrochlorothiazide (BENICAR HCT) 40-25 MG tablet Take 1 tablet by mouth daily. 02/07/20   McLean-Scocuzza, Nino Glow, MD  ondansetron (ZOFRAN) 4 MG tablet Take 1 tablet (4 mg total) by mouth every 8 (eight) hours as needed for nausea or vomiting. 11/21/20   McLean-Scocuzza, Nino Glow, MD  Semaglutide-Weight Management (WEGOVY) 0.25 MG/0.5ML SOAJ Inject 0.25 mg into the skin once a week. Week 1-4 Patient not taking: Reported on 12/24/2020 12/23/20   McLean-Scocuzza, Nino Glow, MD  Semaglutide-Weight Management [facility]) 0.5 MG/0.5ML SOAJ Inject 0.5 mg into the skin once a week. Week 5-8 Patient not taking: Reported on 12/24/2020 12/23/20   McLean-Scocuzza, Nino Glow, MD  Semaglutide-Weight Management [facility]) 1 MG/0.5ML SOAJ Inject 1 mg into the skin once a week. Week 9-12 12/23/20   McLean-Scocuzza, Nino Glow, MD  Semaglutide-Weight Management (WEGOVY) 1.7  MG/0.75ML SOAJ Inject 1.7 mg into the skin once a week. Week 13-16 Patient not taking: Reported on 12/24/2020 12/23/20   McLean-Scocuzza, Nino Glow, MD  Semaglutide-Weight Management (WEGOVY) 2.4 MG/0.75ML SOAJ Inject 2.4 mg into the skin once a week. Week 17 and after Patient not taking: Reported on 12/24/2020 12/23/20   McLean-Scocuzza, Nino Glow, MD  tiZANidine (ZANAFLEX) 4 MG capsule Take 1 capsule (4 mg total) by mouth at bedtime as needed for muscle spasms. 11/20/20   McLean-Scocuzza, Nino Glow, MD  venlafaxine XR (EFFEXOR-XR) 150 MG 24 hr capsule Take 1 capsule (150 mg total) by mouth daily with breakfast. 01/24/20   McLean-Scocuzza, Nino Glow, MD    Allergies Other, Penicillins, Percocet [oxycodone-acetaminophen], Ambien [zolpidem], Lipitor [atorvastatin], Aspirin, and Latex  Family History  Problem Relation Age of Onset  . Cancer Mother 7       lung cancer-small cell lung cancer   . Heart disease Mother        MI with stents  . Hypertension Mother   . Diabetes Mother   . COPD Mother   .  Hypertension Father   . Hyperlipidemia Father   . Cancer Maternal Aunt        breast cancer  . Breast cancer Maternal Aunt   . Cancer Maternal Uncle        colon cancer  . Mental illness Son   . Alcohol abuse Maternal Aunt        Liver Failure  . Alcohol abuse Maternal Grandfather   . Heart disease Maternal Grandfather   . Arthritis Paternal Grandmother   . Cancer Paternal Grandmother        leukemia   . Hypertension Paternal Grandmother   . Breast cancer Paternal Grandmother   . Cancer Maternal Grandmother        brain cancer   . Heart disease Paternal Grandfather   . Hypertension Paternal Grandfather     Social History Social History   Tobacco Use  . Smoking status: Never Smoker  . Smokeless tobacco: Never Used  Vaping Use  . Vaping Use: Never used  Substance Use Topics  . Alcohol use: Yes    Comment: 2 DRINK WEEKLY  . Drug use: Not Currently    Types: Marijuana    Comment: 17 YEARS AGO     Review of Systems Constitutional: No fever/chills Eyes: No visual changes. ENT: No sore throat. Cardiovascular: Endorses chest pain. Respiratory: Denies shortness of breath. Gastrointestinal: No abdominal pain.  No nausea, no vomiting.  No diarrhea. Genitourinary: Negative for dysuria. Musculoskeletal: Negative for acute arthralgias Skin: Negative for rash. Neurological: Negative for headaches, weakness/numbness/paresthesias in any extremity Psychiatric: Negative for suicidal ideation/homicidal ideation   ____________________________________________   PHYSICAL EXAM:  VITAL SIGNS: ED Triage Vitals  Enc Vitals Group     BP 01/22/21 0712 121/86     Pulse Rate 01/22/21 0712 (!) 105     Resp 01/22/21 0712 18     Temp 01/22/21 0712 98.2 F (36.8 C)     Temp Source 01/22/21 0712 Oral     SpO2 01/22/21 0712 100 %     Weight 01/22/21 0713 224 lb (101.6 kg)     Height 01/22/21 0713 5\' 3"  (1.6 m)     Head Circumference --      Peak Flow --      Pain Score 01/22/21 0713 8     Pain Loc --      Pain Edu? --      Excl. in [place]? --    Constitutional: Alert and oriented. Well appearing overweight middle-aged African-American female sitting forward in bed rocking back and forth Eyes: Conjunctivae are normal. PERRL. Head: Atraumatic. Nose: No congestion/rhinnorhea. Mouth/Throat: Mucous membranes are moist. Neck: No stridor Cardiovascular: Grossly normal heart sounds.  Good peripheral circulation. Respiratory: Tachypneic.  No retractions.  Clear to auscultation bilaterally Gastrointestinal: Soft and nontender. No distention. Musculoskeletal: No obvious deformities Neurologic:  Normal speech and language. No gross focal neurologic deficits are appreciated. Skin:  Skin is warm and dry. No rash noted. Psychiatric: Mood and affect are normal. Speech and behavior are normal.  ____________________________________________   LABS (all labs ordered are listed, but only abnormal results  are displayed)  Labs Reviewed  COMPREHENSIVE METABOLIC PANEL - Abnormal; Notable for the following components:      Result Value   Sodium 133 (*)    Potassium 3.1 (*)    CO2 21 (*)    Calcium 8.8 (*)    AST 57 (*)    All other components within normal limits  CBC WITH DIFFERENTIAL/PLATELET - Abnormal; Notable for  the following components:   RBC 3.55 (*)    Hemoglobin 11.9 (*)    HCT 33.5 (*)    All other components within normal limits  TROPONIN I (HIGH SENSITIVITY)  TROPONIN I (HIGH SENSITIVITY)   ____________________________________________  EKG  ED ECG REPORT I, Naaman Plummer, the attending physician, personally viewed and interpreted this ECG.  Date: 01/22/2021 EKG Time: 0709 Rate: 106 Rhythm: Tachycardic sinus rhythm QRS Axis: normal Intervals: normal ST/T Wave abnormalities: normal Narrative Interpretation: no evidence of acute ischemia  ____________________________________________  RADIOLOGY  ED MD interpretation: One-view portable chest x-ray shows no evidence of acute abnormalities including no pneumonia, pneumothorax, or widened mediastinum  Official radiology report(s): DG Chest Port 1 View  Result Date: 01/22/2021 CLINICAL DATA:  Chest pain. EXAM: PORTABLE CHEST 1 VIEW COMPARISON:  02/09/2011. FINDINGS: Mediastinum and hilar structures normal. Heart size normal. Low lung volumes. No pleural effusion or pneumothorax. Thoracic spine scoliosis. No acute bony abnormality. IMPRESSION: Low lung volumes.  No acute cardiopulmonary disease. Electronically Signed   By: Marcello Moores  Register   On: 01/22/2021 07:46    ____________________________________________   PROCEDURES  Procedure(s) performed (including Critical Care):  .1-3 Lead EKG Interpretation Performed by: Naaman Plummer, MD Authorized by: Naaman Plummer, MD     Interpretation: abnormal     ECG rate:  104   ECG rate assessment: tachycardic     Rhythm: sinus tachycardia     Ectopy: none      Conduction: normal       ____________________________________________   INITIAL IMPRESSION / ASSESSMENT AND PLAN / ED COURSE  As part of my medical decision making, I reviewed the following data within the [place] notes reviewed and incorporated, Labs reviewed, EKG interpreted, Old chart reviewed, Radiograph reviewed and Notes from prior ED visits reviewed and incorporated        Workup: ECG, CXR, CBC, BMP, Troponin Findings: ECG: No overt evidence of STEMI. No evidence of Brugadas sign, delta wave, epsilon wave, significantly prolonged QTc, or malignant arrhythmia HS Troponin: Negative x1 Other Labs unremarkable for emergent problems. CXR: Without PTX, PNA, or widened mediastinum Last Stress Test: Never Last Heart Catheterization: Never HEART Score: 2  Given History, Exam, and Workup I have low suspicion for ACS, Pneumothorax, Pneumonia, Pulmonary Embolus, Tamponade, Aortic Dissection or other emergent problem as a cause for this presentation.   Reassesment: Prior to discharge patients pain was controlled and they were well appearing.  Disposition:  Discharge. Strict return precautions discussed with patient with full understanding. Advised patient to follow up promptly with primary care provider      ____________________________________________   FINAL CLINICAL IMPRESSION(S) / ED DIAGNOSES  Final diagnoses:  Chest pain, unspecified type     ED Discharge Orders         Ordered    sucralfate (CARAFATE) 1 GM/10ML suspension  4 times daily        01/22/21 0935    famotidine (PEPCID) 20 MG tablet  2 times daily        01/22/21 0935           Note:  This document was prepared using Dragon voice recognition software and may include unintentional dictation errors.   Naaman Plummer, MD 01/22/21 [id number]

## 2021-01-22 NOTE — ED Notes (Signed)
IV catheter removed intact without complications.  Advised of RX and follow up.  Advised pt to return to ER for worsening symptoms.  Instructed pt not to drive. All questions addressed.  Understanding verbalized.   Pt left ER ambulatory with steady gait with family/significant other.

## 2021-01-23 NOTE — Procedures (Signed)
   [facility] ([facility] PAT)  STUDY DATE: 01-21-21  DOB: 09-24-1976  MRN: [id number]  ORDERING CLINICIAN: Star Age, MD, PhD   REFERRING CLINICIAN: McLean-Scocuzza, Nino Glow, MD   CLINICAL INFORMATION/HISTORY: 45 year old right-handed woman with an underlying medical history of hypertension, sinusitis, vitamin D deficiency, depression, anxiety, anemia and obesity, who reports snoring and excessive daytime somnolence as well as recurrent headaches including morning headaches.  Epworth sleepiness score: 7/24.  BMI: 40.6 kg/m  Neck Circumference: 14-5/8"  FINDINGS:   Total Record Time (hours, min): 6 H 14 min  Total Sleep Time (hours, min):  4 H 58 min   Percent REM (%):    19.65 %   Calculated pAHI (per hour): 12.4     REM pAHI: 9.9  NREM pAHI: 12.7 Supine AHI: 12.9   Oxygen Saturation (%) Mean: 94  Minimum oxygen saturation (%):        89   O2 Saturation Range (%): 89-97  O2Saturation (minutes) <=88%: 0 min  Pulse Mean (bpm):    95  Pulse Range (52-113)   IMPRESSION: OSA (obstructive sleep apnea), mild   RECOMMENDATION:  This home sleep test demonstrates overall mild obstructive sleep apnea with a total AHI of 12.4/hour and O2 nadir of 89%. Given the patient's medical history and sleep related complaints, treatment with positive airway pressure is recommended. This can be achieved in the form of autoPAP trial/titration at home. A full night CPAP titration study may help with proper treatment settings and mask fitting if needed. Alternative treatments include weight loss along with avoidance of the supine sleep position, or an oral appliance in appropriate candidates.   Please note that untreated obstructive sleep apnea may carry additional perioperative morbidity. Patients with significant obstructive sleep apnea should receive perioperative PAP therapy and the surgeons and particularly the anesthesiologist should be informed of the diagnosis and  the severity of the sleep disordered breathing. The patient should be cautioned not to drive, work at heights, or operate dangerous or heavy equipment when tired or sleepy. Review and reiteration of good sleep hygiene measures should be pursued with any patient. Other causes of the patient's symptoms, including circadian rhythm disturbances, an underlying mood disorder, medication effect and/or an underlying medical problem cannot be ruled out based on this test. Clinical correlation is recommended.   The patient and his referring provider will be notified of the test results. The patient will be seen in follow up in sleep clinic at [facility].  I certify that I have reviewed the raw data recording prior to the issuance of this report in accordance with the standards of the American Academy of Sleep Medicine (AASM).  INTERPRETING PHYSICIAN:  Star Age, MD, PhD  Board Certified in Neurology and Sleep Medicine  [facility] [address] [place], [place] [address] [phone]

## 2021-01-23 NOTE — Progress Notes (Signed)
Patient referred by Dr. Terese Door, seen by me on 12/24/20, HST on 01/21/21.    Please call and notify the patient that the recent home sleep test showed obstructive sleep apnea. OSA is overall mild, but worth treating to see if she feels better after treatment. To that end I recommend treatment for this in the form of autoPAP, which means, that we don't have to bring her in for a sleep study with CPAP, but will let her try an autoPAP machine at home, through a DME company (of her choice, or as per insurance requirement). The DME representative will educate her on how to use the machine, how to put the mask on, etc. I have placed an order in the chart. Please send referral, talk to patient, send report to referring MD. We will need a FU in sleep clinic for 10 weeks post-PAP set up, please arrange that with me or one of our NPs. Thanks,   Star Age, MD, PhD [facility])

## 2021-01-23 NOTE — Addendum Note (Signed)
Addended by: Star Age on: 01/23/2021 10:25 AM   Modules accepted: Orders

## 2021-01-28 NOTE — Telephone Encounter (Signed)
Appeal denied via fax. Dr Olivia Mackie McLean-Scocuzza would like for Patient to be called and asked if she would like referral to weight loss clinic.   Patient informed and verbalized understanding. She is agreeable to referral to weight loss clinic in [place].   Patient aware dr Olivia Mackie McLean-Scocuzza is out of office until 02/03/21 and is willing to wait on referral

## 2021-01-29 ENCOUNTER — Telehealth: Payer: Self-pay

## 2021-01-29 NOTE — Telephone Encounter (Signed)
Noted, thank you

## 2021-01-29 NOTE — Telephone Encounter (Signed)
I called the pt and we reviewed the results along with the option of starting a dental device/weight loss efforts.  Pt is going to discuss options with husband and CB to let us know how she would like to proceed.

## 2021-01-29 NOTE — Telephone Encounter (Signed)
-----   Message from Star Age, MD sent at 01/23/2021 10:25 AM EDT ----- Patient referred by Dr. Terese Door, seen by me on 12/24/20, HST on 01/21/21.    Please call and notify the patient that the recent home sleep test showed obstructive sleep apnea. OSA is overall mild, but worth treating to see if she feels better after treatment. To that end I recommend treatment for this in the form of autoPAP, which means, that we don't have to bring her in for a sleep study with CPAP, but will let her try an autoPAP machine at home, through a DME company (of her choice, or as per insurance requirement). The DME representative will educate her on how to use the machine, how to put the mask on, etc. I have placed an order in the chart. Please send referral, talk to patient, send report to referring MD. We will need a FU in sleep clinic for 10 weeks post-PAP set up, please arrange that with me or one of our NPs. Thanks,   Star Age, MD, PhD [facility])

## 2021-02-02 ENCOUNTER — Encounter: Payer: Self-pay | Admitting: Internal Medicine

## 2021-02-02 DIAGNOSIS — G4733 Obstructive sleep apnea (adult) (pediatric): Secondary | ICD-10-CM | POA: Insufficient documentation

## 2021-02-02 NOTE — Addendum Note (Signed)
Addended by: Orland Mustard on: 02/02/2021 02:53 PM   Modules accepted: Orders

## 2021-03-04 ENCOUNTER — Ambulatory Visit: Payer: 59 | Admitting: Internal Medicine

## 2021-03-05 ENCOUNTER — Ambulatory Visit: Payer: 59 | Admitting: Internal Medicine

## 2021-03-05 ENCOUNTER — Encounter: Payer: Self-pay | Admitting: Internal Medicine

## 2021-03-05 ENCOUNTER — Other Ambulatory Visit: Payer: Self-pay

## 2021-03-05 VITALS — BP 88/60 | HR 100 | Temp 97.7°F | Ht 63.0 in | Wt 226.0 lb

## 2021-03-05 DIAGNOSIS — F32A Depression, unspecified: Secondary | ICD-10-CM

## 2021-03-05 DIAGNOSIS — E876 Hypokalemia: Secondary | ICD-10-CM | POA: Diagnosis not present

## 2021-03-05 DIAGNOSIS — E785 Hyperlipidemia, unspecified: Secondary | ICD-10-CM

## 2021-03-05 DIAGNOSIS — R160 Hepatomegaly, not elsewhere classified: Secondary | ICD-10-CM

## 2021-03-05 DIAGNOSIS — R748 Abnormal levels of other serum enzymes: Secondary | ICD-10-CM | POA: Diagnosis not present

## 2021-03-05 DIAGNOSIS — Z8249 Family history of ischemic heart disease and other diseases of the circulatory system: Secondary | ICD-10-CM | POA: Diagnosis not present

## 2021-03-05 DIAGNOSIS — M47816 Spondylosis without myelopathy or radiculopathy, lumbar region: Secondary | ICD-10-CM

## 2021-03-05 DIAGNOSIS — K760 Fatty (change of) liver, not elsewhere classified: Secondary | ICD-10-CM

## 2021-03-05 DIAGNOSIS — E538 Deficiency of other specified B group vitamins: Secondary | ICD-10-CM | POA: Diagnosis not present

## 2021-03-05 DIAGNOSIS — R739 Hyperglycemia, unspecified: Secondary | ICD-10-CM

## 2021-03-05 DIAGNOSIS — M419 Scoliosis, unspecified: Secondary | ICD-10-CM

## 2021-03-05 DIAGNOSIS — R0789 Other chest pain: Secondary | ICD-10-CM | POA: Diagnosis not present

## 2021-03-05 DIAGNOSIS — I10 Essential (primary) hypertension: Secondary | ICD-10-CM | POA: Diagnosis not present

## 2021-03-05 DIAGNOSIS — E559 Vitamin D deficiency, unspecified: Secondary | ICD-10-CM | POA: Diagnosis not present

## 2021-03-05 DIAGNOSIS — M25572 Pain in left ankle and joints of left foot: Secondary | ICD-10-CM

## 2021-03-05 DIAGNOSIS — F419 Anxiety disorder, unspecified: Secondary | ICD-10-CM

## 2021-03-05 DIAGNOSIS — E871 Hypo-osmolality and hyponatremia: Secondary | ICD-10-CM

## 2021-03-05 DIAGNOSIS — G8929 Other chronic pain: Secondary | ICD-10-CM

## 2021-03-05 DIAGNOSIS — I959 Hypotension, unspecified: Secondary | ICD-10-CM

## 2021-03-05 LAB — COMPREHENSIVE METABOLIC PANEL
ALT: 40 U/L — ABNORMAL HIGH (ref 0–35)
AST: 54 U/L — ABNORMAL HIGH (ref 0–37)
Albumin: 4.3 g/dL (ref 3.5–5.2)
Alkaline Phosphatase: 107 U/L (ref 39–117)
BUN: 16 mg/dL (ref 6–23)
CO2: 26 mEq/L (ref 19–32)
Calcium: 9.3 mg/dL (ref 8.4–10.5)
Chloride: 92 mEq/L — ABNORMAL LOW (ref 96–112)
Creatinine, Ser: 1.06 mg/dL (ref 0.40–1.20)
GFR: 63.34 mL/min (ref >60.00)
Glucose, Bld: 89 mg/dL (ref 70–99)
Potassium: 3.3 mEq/L — ABNORMAL LOW (ref 3.5–5.1)
Sodium: 131 mEq/L — ABNORMAL LOW (ref 135–145)
Total Bilirubin: 0.4 mg/dL (ref 0.2–1.2)
Total Protein: 6.9 g/dL (ref 6.0–8.3)

## 2021-03-05 LAB — VITAMIN D 25 HYDROXY (VIT D DEFICIENCY, FRACTURES): VITD: 30.55 ng/mL (ref 30.00–100.00)

## 2021-03-05 LAB — MAGNESIUM: Magnesium: 2 mg/dL (ref 1.5–2.5)

## 2021-03-05 LAB — LIPID PANEL
Cholesterol: 210 mg/dL — ABNORMAL HIGH (ref 0–200)
HDL: 79.6 mg/dL (ref >39.00)
LDL Cholesterol: 97 mg/dL (ref 0–99)
NonHDL: 130.82
Total CHOL/HDL Ratio: 3
Triglycerides: 169 mg/dL — ABNORMAL HIGH (ref 0.0–149.0)
VLDL: 33.8 mg/dL (ref 0.0–40.0)

## 2021-03-05 LAB — VITAMIN B12: Vitamin B-12: 150 pg/mL — ABNORMAL LOW (ref 211–911)

## 2021-03-05 LAB — HEMOGLOBIN A1C: Hgb A1c MFr Bld: 5.5 % (ref 4.6–6.5)

## 2021-03-05 MED ORDER — AMLODIPINE BESYLATE 5 MG PO TABS: 5.0000 mg | ORAL_TABLET | Freq: Two times a day (BID) | ORAL | 3 refills | Status: DC

## 2021-03-05 MED ORDER — OLMESARTAN MEDOXOMIL-HCTZ 20-12.5 MG PO TABS: 1.0000 | ORAL_TABLET | Freq: Every day | ORAL | 3 refills | Status: DC

## 2021-03-05 NOTE — Patient Instructions (Addendum)
Consider align pre/probiotics and immodium 2 mg at need  Monitor BP <90/<60 too low goal ,130/<80 Cut benicar 40-25 in 1/2  norvasc can take 5 mg daily and can take another dose if BP>130/>80  Food Choices for Gastroesophageal Reflux Disease, Adult When you have gastroesophageal reflux disease (GERD), the foods you eat and your eating habits are very important. Choosing the right foods can help ease the discomfort of GERD. Consider working with a dietitian to help you make healthy food choices. What are tips for following this plan? Reading food labels  Look for foods that are low in saturated fat. Foods that have less than 5% of daily value (DV) of fat and 0 g of trans fats may help with your symptoms. Cooking  Cook foods using methods other than frying. This may include baking, steaming, grilling, or broiling. These are all methods that do not need a lot of fat for cooking.  To add flavor, try to use herbs that are low in spice and acidity. Meal planning  Choose healthy foods that are low in fat, such as fruits, vegetables, whole grains, low-fat dairy products, lean meats, fish, and poultry.  Eat frequent, small meals instead of three large meals each day. Eat your meals slowly, in a relaxed setting. Avoid bending over or lying down until 2-3 hours after eating.  Limit high-fat foods such as fatty meats or fried foods.  Limit your intake of fatty foods, such as oils, butter, and shortening.  Avoid the following as told by your health care provider: ? Foods that cause symptoms. These may be different for different people. Keep a food diary to keep track of foods that cause symptoms. ? Alcohol. ? Drinking large amounts of liquid with meals. ? Eating meals during the 2-3 hours before bed.   Lifestyle  Maintain a healthy weight. Ask your health care provider what weight is healthy for you. If you need to lose weight, work with your health care provider to do so safely.  Exercise for  at least 30 minutes on 5 or more days each week, or as told by your health care provider.  Avoid wearing clothes that fit tightly around your waist and chest.  Do not use any products that contain nicotine or tobacco. These products include cigarettes, chewing tobacco, and vaping devices, such as e-cigarettes. If you need help quitting, ask your health care provider.  Sleep with the head of your bed raised. Use a wedge under the mattress or blocks under the bed frame to raise the head of the bed.  Chew sugar-free gum after mealtimes. What foods should I eat? Eat a healthy, well-balanced diet of fruits, vegetables, whole grains, low-fat dairy products, lean meats, fish, and poultry. Each person is different. Foods that may trigger symptoms in one person may not trigger any symptoms in another person. Work with your health care provider to identify foods that are safe for you. The items listed above may not be a complete list of recommended foods and beverages. Contact a dietitian for more information.   What foods should I avoid? Limiting some of these foods may help manage the symptoms of GERD. Everyone is different. Consult a dietitian or your health care provider to help you identify the exact foods to avoid, if any. Fruits Any fruits prepared with added fat. Any fruits that cause symptoms. For some people this may include citrus fruits, such as oranges, grapefruit, pineapple, and lemons. Vegetables Deep-fried vegetables. [place] fries. Any vegetables prepared with  added fat. Any vegetables that cause symptoms. For some people, this may include tomatoes and tomato products, chili peppers, onions and garlic, and horseradish. Grains Pastries or quick breads with added fat. Meats and other proteins High-fat meats, such as fatty beef or pork, hot dogs, ribs, ham, sausage, salami, and bacon. Fried meat or protein, including fried fish and fried chicken. Nuts and nut butters, in large  amounts. Dairy Whole milk and chocolate milk. Sour cream. Cream. Ice cream. Cream cheese. Milkshakes. Fats and oils Butter. Margarine. Shortening. Ghee. Beverages Coffee and tea, with or without caffeine. Carbonated beverages. Sodas. Energy drinks. Fruit juice made with acidic fruits, such as orange or grapefruit. Tomato juice. Alcoholic drinks. Sweets and desserts Chocolate and cocoa. Donuts. Seasonings and condiments Pepper. Peppermint and spearmint. Added salt. Any condiments, herbs, or seasonings that cause symptoms. For some people, this may include curry, hot sauce, or vinegar-based salad dressings. The items listed above may not be a complete list of foods and beverages to avoid. Contact a dietitian for more information. Questions to ask your health care provider Diet and lifestyle changes are usually the first steps that are taken to manage symptoms of GERD. If diet and lifestyle changes do not improve your symptoms, talk with your health care provider about taking medicines. Where to find more information  International [employer] for Gastrointestinal Disorders: aboutgerd.org Summary  When you have gastroesophageal reflux disease (GERD), food and lifestyle choices may be very helpful in easing the discomfort of GERD.  Eat frequent, small meals instead of three large meals each day. Eat your meals slowly, in a relaxed setting. Avoid bending over or lying down until 2-3 hours after eating.  Limit high-fat foods such as fatty meats or fried foods. This information is not intended to replace advice given to you by your health care provider. Make sure you discuss any questions you have with your health care provider. Document Revised: 04/14/2020 Document Reviewed: 04/14/2020 Elsevier Patient Education  2021 [place] stands for fermentable oligosaccharides, disaccharides, monosaccharides, and polyols. These are sugars that are hard for some  people to digest. A low-FODMAP eating plan may help some people who have irritable bowel syndrome (IBS) and certain other bowel (intestinal) diseases to manage their symptoms. This meal plan can be complicated to follow. Work with a diet and nutrition specialist (dietitian) to make a low-FODMAP eating plan that is right for you. A dietitian can help make sure that you get enough nutrition from this diet. What are tips for following this plan? Reading food labels  Check labels for hidden FODMAPs such as: ? High-fructose syrup. ? Honey. ? Agave. ? Natural fruit flavors. ? Onion or garlic powder.  Choose low-FODMAP foods that contain 3-4 grams of fiber per serving.  Check food labels for serving sizes. Eat only one serving at a time to make sure FODMAP levels stay low. Shopping  Shop with a list of foods that are recommended on this diet and make a meal plan. Meal planning  Follow a low-FODMAP eating plan for up to 6 weeks, or as told by your health care provider or dietitian.  To follow the eating plan: 1. Eliminate high-FODMAP foods from your diet completely. Choose only low-FODMAP foods to eat. You will do this for 2-6 weeks. 2. Gradually reintroduce high-FODMAP foods into your diet one at a time. Most people should wait a few days before introducing the next new high-FODMAP food into their meal plan. Your dietitian can  recommend how quickly you may reintroduce foods. 3. Keep a daily record of what and how much you eat and drink. Make note of any symptoms that you have after eating. 4. Review your daily record with a dietitian regularly to identify which foods you can eat and which foods you should avoid. General tips  Drink enough fluid each day to keep your urine pale yellow.  Avoid processed foods. These often have added sugar and may be high in FODMAPs.  Avoid most dairy products, whole grains, and sweeteners.  Work with a dietitian to make sure you get enough fiber in your  diet.  Avoid high FODMAP foods at meals to manage symptoms. Recommended foods Fruits Bananas, oranges, tangerines, lemons, limes, blueberries, raspberries, strawberries, grapes, cantaloupe, honeydew melon, kiwi, papaya, passion fruit, and pineapple. Limited amounts of dried cranberries, banana chips, and shredded coconut. Vegetables Eggplant, zucchini, cucumber, peppers, green beans, bean sprouts, lettuce, arugula, kale, Swiss chard, spinach, collard greens, bok choy, summer squash, potato, and tomato. Limited amounts of corn, carrot, and sweet potato. Green parts of scallions. Grains Gluten-free grains, such as rice, oats, buckwheat, quinoa, corn, polenta, and millet. Gluten-free pasta, bread, or cereal. Rice noodles. Corn tortillas. Meats and other proteins Unseasoned beef, pork, poultry, or fish. Eggs. Berniece Salines. Tofu (firm) and tempeh. Limited amounts of nuts and seeds, such as almonds, walnuts, [place] nuts, pecans, peanuts, nut butters, pumpkin seeds, chia seeds, and sunflower seeds. Dairy Lactose-free milk, yogurt, and kefir. Lactose-free cottage cheese and ice cream. Non-dairy milks, such as almond, coconut, hemp, and rice milk. Non-dairy yogurt. Limited amounts of goat cheese, brie, mozzarella, parmesan, swiss, and other hard cheeses. Fats and oils Butter-free spreads. Vegetable oils, such as olive, canola, and sunflower oil. Seasoning and other foods Artificial sweeteners with names that do not end in "ol," such as aspartame, saccharine, and stevia. Maple syrup, white table sugar, raw sugar, brown sugar, and molasses. Mayonnaise, soy sauce, and tamari. Fresh basil, coriander, parsley, rosemary, and thyme. Beverages Water and mineral water. Sugar-sweetened soft drinks. Small amounts of orange juice or cranberry juice. Black and green tea. Most dry wines. Coffee. The items listed above may not be a complete list of foods and beverages you can eat. Contact a dietitian for more  information. Foods to avoid Fruits Fresh, dried, and juiced forms of apple, pear, watermelon, peach, plum, cherries, apricots, blackberries, boysenberries, figs, nectarines, and mango. Avocado. Vegetables Chicory root, artichoke, asparagus, cabbage, snow peas, Brussels sprouts, broccoli, sugar snap peas, mushrooms, celery, and cauliflower. Onions, garlic, leeks, and the white part of scallions. Grains Wheat, including kamut, durum, and semolina. Barley and bulgur. Couscous. Wheat-based cereals. Wheat noodles, bread, crackers, and pastries. Meats and other proteins Fried or fatty meat. Sausage. Cashews and pistachios. Soybeans, baked beans, black beans, chickpeas, kidney beans, fava beans, navy beans, lentils, black-eyed peas, and split peas. Dairy Milk, yogurt, ice cream, and soft cheese. Cream and sour cream. Milk-based sauces. Custard. Buttermilk. Soy milk. Seasoning and other foods Any sugar-free gum or candy. Foods that contain artificial sweeteners such as sorbitol, mannitol, isomalt, or xylitol. Foods that contain honey, high-fructose corn syrup, or agave. Bouillon, vegetable stock, beef stock, and chicken stock. Garlic and onion powder. Condiments made with onion, such as hummus, chutney, pickles, relish, salad dressing, and salsa. Tomato paste. Beverages Chicory-based drinks. Coffee substitutes. Chamomile tea. Fennel tea. Sweet or fortified wines such as port or sherry. Diet soft drinks made with isomalt, mannitol, maltitol, sorbitol, or xylitol. Apple, pear, and mango juice. Juices with high-fructose  corn syrup. The items listed above may not be a complete list of foods and beverages you should avoid. Contact a dietitian for more information. Summary  FODMAP stands for fermentable oligosaccharides, disaccharides, monosaccharides, and polyols. These are sugars that are hard for some people to digest.  A low-FODMAP eating plan is a short-term diet that helps to ease symptoms of certain  bowel diseases.  The eating plan usually lasts up to 6 weeks. After that, high-FODMAP foods are reintroduced gradually and one at a time. This can help you find out which foods may be causing symptoms.  A low-FODMAP eating plan can be complicated. It is best to work with a dietitian who has experience with this type of plan. This information is not intended to replace advice given to you by your health care provider. Make sure you discuss any questions you have with your health care provider. Document Revised: 02/21/2020 Document Reviewed: 02/21/2020 Elsevier Patient Education  2021 [place].  Nonalcoholic Fatty Liver Disease Diet, Adult Nonalcoholic fatty liver disease is a condition that causes fat to build up in and around the liver. The disease makes it harder for the liver to work the way that it should. Following a healthy diet can help to keep nonalcoholic fatty liver disease under control. It can also help to prevent or improve conditions that are associated with the disease, such as heart disease, diabetes, high blood pressure, and abnormal cholesterol levels. Along with regular exercise, this diet:  Promotes weight loss.  Helps to control blood sugar levels.  Helps to improve the way that the body uses insulin. What are tips for following this plan? Reading food labels Always check food labels for:  The amount of saturated fat in a food. You should limit your intake of saturated fat. Saturated fat is found in foods that come from animals, including meat and dairy products such as butter, cheese, and whole milk.  The amount of fiber in a food. You should choose high-fiber foods such as fruits, vegetables, and whole grains. Try to get 25-30 grams (g) of fiber a day.   Cooking  When cooking, use heart-healthy oils that are high in monounsaturated fats. These include olive oil, canola oil, and avocado oil.  Limit frying or deep-frying foods. Cook foods using healthy methods  such as baking, boiling, steaming, and grilling instead. Meal planning  You may want to keep track of how many calories you take in. Eating the right amount of calories will help you achieve a healthy weight. Meeting with a registered dietitian can help you get started.  Limit how often you eat takeout and fast food. These foods are usually very high in fat, salt, and sugar.  Use the glycemic index (GI) to plan your meals. The index tells you how quickly a food will raise your blood sugar. Choose low-GI foods (GI less than 55). These foods take a longer time to raise blood sugar. A registered dietitian can help you identify foods lower on the GI scale. Lifestyle  You may want to follow a Mediterranean diet. This diet includes a lot of vegetables, lean meats or fish, whole grains, fruits, and healthy oils and fats. What foods can I eat? Fruits Bananas. Apples. Oranges. Grapes. Papaya. Mango. Pomegranate. Kiwi. Grapefruit. Cherries. Vegetables Lettuce. Spinach. Peas. Beets. Cauliflower. Cabbage. Broccoli. Carrots. Tomatoes. Squash. Eggplant. Herbs. Peppers. Onions. Cucumbers. Brussels sprouts. Yams and sweet potatoes. Beans. Lentils. Grains Whole wheat or whole-grain foods, including breads, crackers, cereals, and pasta. Stone-ground whole wheat.  Unsweetened oatmeal. Bulgur. Barley. Quinoa. Brown or wild rice. Corn or whole wheat flour tortillas. Meats and other proteins Lean meats. Poultry. Tofu. Seafood and shellfish. Dairy Low-fat or fat-free dairy products, such as yogurt, cottage cheese, or cheese. Beverages Water. Sugar-free drinks. Tea. Coffee. Low-fat or skim milk. Milk alternatives, such as soy or almond milk. Real fruit juice. Fats and oils Avocado. Canola or olive oil. Nuts and nut butters. Seeds. Seasonings and condiments Mustard. Relish. Low-fat, low-sugar ketchup and barbecue sauce. Low-fat or fat-free mayonnaise. Sweets and desserts Sugar-free sweets. The items listed above  may not be a complete list of foods and beverages you can eat. Contact a dietitian for more information.   What foods should I limit or avoid? Meats and other proteins Limit red meat to 1-2 times a week. Dairy [employer]. Fats and oils Palm oil and coconut oil. Fried foods. Other foods Processed foods. Foods that contain a lot of salt or sodium. Sweets and desserts Sweets that contain sugar. Beverages Sweetened drinks, such as sweet tea, milkshakes, iced sweet drinks, and sodas. Alcohol. The items listed above may not be a complete list of foods and beverages you should avoid. Contact a dietitian for more information. Where to find more information The [employer] of Diabetes and Digestive and Kidney Diseases: [employer].is Summary  Nonalcoholic fatty liver disease is a condition that causes fat to build up in and around the liver.  Following a healthy diet can help to keep nonalcoholic fatty liver disease under control. Your diet should be rich in fruits, vegetables, whole grains, and lean proteins.  Limit your intake of saturated fat. Saturated fat is found in foods that come from animals, including meat and dairy products such as butter, cheese, and whole milk.  This diet promotes weight loss, helps to control blood sugar levels, and helps to improve the way that the body uses insulin. This information is not intended to replace advice given to you by your health care provider. Make sure you discuss any questions you have with your health care provider. Document Revised: 01/26/2019 Document Reviewed: 10/26/2018 Elsevier Patient Education  2021 [place].  Fatty Liver Disease  The liver converts food into energy, removes toxic material from the blood, makes important proteins, and absorbs necessary vitamins from food. Fatty liver disease occurs when too much fat has built up in your liver cells. Fatty liver disease is also called hepatic steatosis. In many cases,  fatty liver disease does not cause symptoms or problems. It is often diagnosed when tests are being done for other reasons. However, over time, fatty liver can cause inflammation that may lead to more serious liver problems, such as scarring of the liver (cirrhosis) and liver failure. Fatty liver is associated with insulin resistance, increased body fat, high blood pressure (hypertension), and high cholesterol. These are features of metabolic syndrome and increase your risk for stroke, diabetes, and heart disease. What are the causes? This condition may be caused by components of metabolic syndrome:  Obesity.  Insulin resistance.  High cholesterol. Other causes:  Alcohol abuse.  Poor nutrition.  Cushing syndrome.  Pregnancy.  Certain drugs.  Poisons.  Some viral infections. What increases the risk? You are more likely to develop this condition if you:  Abuse alcohol.  Are overweight.  Have diabetes.  Have hepatitis.  Have a high triglyceride level.  Are pregnant. What are the signs or symptoms? Fatty liver disease often does not cause symptoms. If symptoms do develop, they can include:  Fatigue and weakness.  Weight loss.  Confusion.  Nausea, vomiting, or abdominal pain.  Yellowing of your skin and the white parts of your eyes (jaundice).  Itchy skin. How is this diagnosed? This condition may be diagnosed by:  A physical exam and your medical history.  Blood tests.  Imaging tests, such as an ultrasound, CT scan, or MRI.  A liver biopsy. A small sample of liver tissue is removed using a needle. The sample is then looked at under a microscope. How is this treated? Fatty liver disease is often caused by other health conditions. Treatment for fatty liver may involve medicines and lifestyle changes to manage conditions such as:  Alcoholism.  High cholesterol.  Diabetes.  Being overweight or obese. Follow these instructions at home:  Do not drink  alcohol. If you have trouble quitting, ask your health care provider how to safely quit with the help of medicine or a supervised program. This is important to keep your condition from getting worse.  Eat a healthy diet as told by your health care provider. Ask your health care provider about working with a dietitian to develop an eating plan.  Exercise regularly. This can help you lose weight and control your cholesterol and diabetes. Talk to your health care provider about an exercise plan and which activities are best for you.  Take over-the-counter and prescription medicines only as told by your health care provider.  Keep all follow-up visits. This is important.   Contact a health care provider if:  You have trouble controlling your: ? Blood sugar. This is especially important if you have diabetes. ? Cholesterol. ? Drinking of alcohol. Get help right away if:  You have abdominal pain.  You have jaundice.  You have nausea and are vomiting.  You vomit blood or material that looks like coffee grounds.  You have stools that are black, tar-like, or bloody. Summary  Fatty liver disease develops when too much fat builds up in the cells of your liver.  Fatty liver disease often causes no symptoms or problems. However, over time, fatty liver can cause inflammation that may lead to more serious liver problems, such as scarring of the liver (cirrhosis).  You are more likely to develop this condition if you abuse alcohol, are pregnant, are overweight, have diabetes, have hepatitis, or have high triglyceride or cholesterol levels.  Contact your health care provider if you have trouble controlling your blood sugar, cholesterol, or drinking of alcohol. This information is not intended to replace advice given to you by your health care provider. Make sure you discuss any questions you have with your health care provider. Document Revised: 07/17/2020 Document Reviewed: 07/17/2020 Elsevier  Patient Education  2021 [place].    High Cholesterol  High cholesterol is a condition in which the blood has high levels of a white, waxy substance similar to fat (cholesterol). The liver makes all the cholesterol that the body needs. The human body needs small amounts of cholesterol to help build cells. A person gets extra or excess cholesterol from the food that he or she eats. The blood carries cholesterol from the liver to the rest of the body. If you have high cholesterol, deposits (plaques) may build up on the walls of your arteries. Arteries are the blood vessels that carry blood away from your heart. These plaques make the arteries narrow and stiff. Cholesterol plaques increase your risk for heart attack and stroke. Work with your health care provider to keep your cholesterol levels  in a healthy range. What increases the risk? The following factors may make you more likely to develop this condition:  Eating foods that are high in animal fat (saturated fat) or cholesterol.  Being overweight.  Not getting enough exercise.  A family history of high cholesterol (familial hypercholesterolemia).  Use of tobacco products.  Having diabetes. What are the signs or symptoms? There are no symptoms of this condition. How is this diagnosed? This condition may be diagnosed based on the results of a blood test.  If you are older than 46 years of age, your health care provider may check your cholesterol levels every 4-6 years.  You may be checked more often if you have high cholesterol or other risk factors for heart disease. The blood test for cholesterol measures:  "Bad" cholesterol, or LDL cholesterol. This is the main type of cholesterol that causes heart disease. The desired level is less than 100 mg/dL.  "Good" cholesterol, or HDL cholesterol. HDL helps protect against heart disease by cleaning the arteries and carrying the LDL to the liver for processing. The desired level for  HDL is 60 mg/dL or higher.  Triglycerides. These are fats that your body can store or burn for energy. The desired level is less than 150 mg/dL.  Total cholesterol. This measures the total amount of cholesterol in your blood and includes LDL, HDL, and triglycerides. The desired level is less than 200 mg/dL. How is this treated? This condition may be treated with:  Diet changes. You may be asked to eat foods that have more fiber and less saturated fats or added sugar.  Lifestyle changes. These may include regular exercise, maintaining a healthy weight, and quitting use of tobacco products.  Medicines. These are given when diet and lifestyle changes have not worked. You may be prescribed a statin medicine to help lower your cholesterol levels. Follow these instructions at home: Eating and drinking  Eat a healthy, balanced diet. This diet includes: ? Daily servings of a variety of fresh, frozen, or canned fruits and vegetables. ? Daily servings of whole grain foods that are rich in fiber. ? Foods that are low in saturated fats and trans fats. These include poultry and fish without skin, lean cuts of meat, and low-fat dairy products. ? A variety of fish, especially oily fish that contain omega-3 fatty acids. Aim to eat fish at least 2 times a week.  Avoid foods and drinks that have added sugar.  Use healthy cooking methods, such as roasting, grilling, broiling, baking, poaching, steaming, and stir-frying. Do not fry your food except for stir-frying.   Lifestyle  Get regular exercise. Aim to exercise for a total of 150 minutes a week. Increase your activity level by doing activities such as gardening, walking, and taking the stairs.  Do not use any products that contain nicotine or tobacco, such as cigarettes, e-cigarettes, and chewing tobacco. If you need help quitting, ask your health care provider.   General instructions  Take over-the-counter and prescription medicines only as told by  your health care provider.  Keep all follow-up visits as told by your health care provider. This is important. Where to find more information  American Heart Association: www.heart.org  National Heart, Lung, and Blood Institute: https://wilson-eaton.com/ Contact a health care provider if:  You have trouble achieving or maintaining a healthy diet or weight.  You are starting an exercise program.  You are unable to stop smoking. Get help right away if:  You have chest pain.  You have trouble breathing.  You have any symptoms of a stroke. "BE FAST" is an easy way to remember the main warning signs of a stroke: ? B - Balance. Signs are dizziness, sudden trouble walking, or loss of balance. ? E - Eyes. Signs are trouble seeing or a sudden change in vision. ? F - Face. Signs are sudden weakness or numbness of the face, or the face or eyelid drooping on one side. ? A - Arms. Signs are weakness or numbness in an arm. This happens suddenly and usually on one side of the body. ? S - Speech. Signs are sudden trouble speaking, slurred speech, or trouble understanding what people say. ? T - Time. Time to call emergency services. Write down what time symptoms started.  You have other signs of a stroke, such as: ? A sudden, severe headache with no known cause. ? Nausea or vomiting. ? Seizure. These symptoms may represent a serious problem that is an emergency. Do not wait to see if the symptoms will go away. Get medical help right away. Call your local emergency services (911 in the [place] Do not drive yourself to the hospital. Summary  Cholesterol plaques increase your risk for heart attack and stroke. Work with your health care provider to keep your cholesterol levels in a healthy range.  Eat a healthy, balanced diet, get regular exercise, and maintain a healthy weight.  Do not use any products that contain nicotine or tobacco, such as cigarettes, e-cigarettes, and chewing tobacco.  Get help  right away if you have any symptoms of a stroke. This information is not intended to replace advice given to you by your health care provider. Make sure you discuss any questions you have with your health care provider. Document Revised: 09/03/2019 Document Reviewed: 09/03/2019 Elsevier Patient Education  2021 [employer].

## 2021-03-05 NOTE — Progress Notes (Signed)
Chief Complaint  Patient presents with  . Follow-up   F/u  1. Hypotension today on benicar 40-25 mg qd norvasc 5 mg qd she is w/o sx's today  2. Left ankle pain 8/10 done with PT   3. Obesity insurance would not cover wegovy f/u wt loss clinic 04/01/21 Dr. Leafy Ro 4. ED visit for chest pain will refer to cards [facility] 01/22/21 atypical chest pain with n/v hot and dizzy  5. Anxiety/depression uncontrolled on ativan 0.5 mg prn work is stressor on effexor 150 xr qd  Review of Systems  Constitutional: Negative for weight loss.  HENT: Negative for hearing loss.   Respiratory: Negative for shortness of breath.   Cardiovascular: Negative for chest pain.  Gastrointestinal: Positive for nausea and vomiting.  Skin: Negative for rash.  Neurological: Positive for dizziness.  Psychiatric/Behavioral: Positive for depression. The patient is nervous/anxious.    Past Medical History:  Diagnosis Date  . Anemia    has required 2 units PRBC  . Anxiety   . Chicken pox   . Depression    trazadone has not helped in the past   . Fatty liver   . Headache   . History of blood transfusion 2009   [place]  . Hypertension   . MRSA (methicillin resistant Staphylococcus aureus)   . Sinusitis   . Sinusitis, chronic   . UTI (urinary tract infection)   . Vitamin D deficiency    Past Surgical History:  Procedure Laterality Date  . ANKLE ARTHROSCOPY Left 05/09/2020   Procedure: ANKLE ARTHROSCOPY OCD REPAIR LEFT;  Surgeon: Samara Deist, DPM;  Location: [facility] ORS;  Service: Podiatry;  Laterality: Left;  . CESAREAN SECTION     x3 [place]  . CHOLECYSTECTOMY  1998  . COLONOSCOPY WITH PROPOFOL N/A 05/01/2019   Procedure: COLONOSCOPY WITH PROPOFOL;  Surgeon: Lucilla Lame, MD;  Location: [facility] ENDOSCOPY;  Service: Endoscopy;  Laterality: N/A;  . ESOPHAGOGASTRODUODENOSCOPY (EGD) WITH PROPOFOL N/A 05/01/2019   Procedure: ESOPHAGOGASTRODUODENOSCOPY (EGD) WITH PROPOFOL;  Surgeon: Lucilla Lame, MD;  Location:  [facility] ENDOSCOPY;  Service: Endoscopy;  Laterality: N/A;  . 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 NASAL SINUS SURGERY     2009/2010  . OSTECTOMY Left 05/09/2020   Procedure: EVANS/MCDO LEFT;  Surgeon: Samara Deist, DPM;  Location: [facility] ORS;  Service: Podiatry;  Laterality: Left;  . TENDON TRANSFER Left 05/09/2020   Procedure: FDL TRANSFER;DEEP LEFT;  Surgeon: Samara Deist, DPM;  Location: [facility] ORS;  Service: Podiatry;  Laterality: Left;  . TUBAL LIGATION    . uterine ablation  2010  . WOUND DEBRIDEMENT Left 05/09/2020   Procedure: A-SCOPE/DEBRIDEMENT/EXTENSIVE LEFT;  Surgeon: Samara Deist, DPM;  Location: [facility] ORS;  Service: Podiatry;  Laterality: Left;   Family History  Problem Relation Age of Onset  . Cancer Mother 90       lung cancer-small cell lung cancer   . Heart disease Mother        MI with stents  . Hypertension Mother   . Diabetes Mother   . COPD Mother   . Hypertension Father   . Hyperlipidemia Father   . Cancer Maternal Aunt        breast cancer  . Breast cancer Maternal Aunt   . Cancer Maternal Uncle        colon cancer  . Mental illness Son   . Alcohol abuse Maternal Aunt        Liver Failure  .  Alcohol abuse Maternal Grandfather   . Heart disease Maternal Grandfather   . Arthritis Paternal Grandmother   . Cancer Paternal Grandmother        leukemia   . Hypertension Paternal Grandmother   . Breast cancer Paternal Grandmother   . Cancer Maternal Grandmother        brain cancer   . Heart disease Paternal Grandfather   . Hypertension Paternal Grandfather    Social History   Socioeconomic History  . Marital status: Married    Spouse name: [name]  . Number of children: 3  . Years of education: 46  . Highest education level: Not on file  Occupational History  . Occupation: caseworker  Tobacco Use  . Smoking status: Never Smoker  . Smokeless tobacco: Never Used  Vaping Use  . Vaping Use: Never used  Substance and Sexual  Activity  . Alcohol use: Yes    Comment: 2 DRINK WEEKLY  . Drug use: Not Currently    Types: Marijuana    Comment: 17 YEARS AGO  . Sexual activity: Yes    Birth control/protection: None, Surgical    Comment: tubal ligation  Other Topics Concern  . Not on file  Social History Narrative   Valerie "[name]" grew up in [place], [place]. She attended [employer] and obtained her Bachelors in [employer]. She lives in [place] with her 3 children and her husband       Caffeine - 2 coffee, no sodas   Exercise - not currently   Bachelors degree    Caseworker    4 pregnancies, 3 live births    Feels safe in relationship    Wears seatbelt   No guns       Social Determinants of Health   Financial Resource Strain: Not on file  Food Insecurity: Not on file  Transportation Needs: Not on file  Physical Activity: Not on file  Stress: Not on file  Social Connections: Not on file  Intimate Partner Violence: Not on file   Current Meds  Medication Sig  . acetaminophen (TYLENOL) 650 MG CR tablet   . cetirizine (ZYRTEC) 10 MG tablet Take 10 mg by mouth daily.  . diphenhydrAMINE (BENADRYL) 25 MG tablet Take 25 mg by mouth daily as needed for allergies.   [name] Kitchen etodolac (LODINE) 400 MG tablet Take by mouth.  . famotidine (PEPCID) 20 MG tablet Take 1 tablet (20 mg total) by mouth 2 (two) times daily.  [name] Kitchen LORazepam (ATIVAN) 0.5 MG tablet Take 1 tablet (0.5 mg total) by mouth daily as needed for anxiety.  . Multiple Vitamins-Minerals (MULTIVITAMIN WITH MINERALS) tablet Take 1 tablet by mouth daily. Woman  . olmesartan-hydrochlorothiazide (BENICAR HCT) 20-12.5 MG tablet Take 1 tablet by mouth daily. D/c 40-25 in am  . ondansetron (ZOFRAN) 4 MG tablet Take 1 tablet (4 mg total) by mouth every 8 (eight) hours as needed for nausea or vomiting.  . sucralfate (CARAFATE) 1 GM/10ML suspension Take 10 mLs (1 g total) by mouth 4 (four) times daily.  [name] Kitchen tiZANidine (ZANAFLEX) 4 MG capsule Take 1 capsule (4 mg  total) by mouth at bedtime as needed for muscle spasms.  . [DISCONTINUED] amLODipine (NORVASC) 5 MG tablet Take 1 tablet by mouth once daily  . [DISCONTINUED] Insulin Pen Needle (PEN NEEDLES) 30G X 8 MM MISC 1 Device by Does not apply route once a week.  . [DISCONTINUED] olmesartan-hydrochlorothiazide (BENICAR HCT) 40-25 MG tablet Take 1 tablet by mouth daily.  . [DISCONTINUED] venlafaxine XR (EFFEXOR-XR) 150  MG 24 hr capsule Take 1 capsule (150 mg total) by mouth daily with breakfast.   Allergies  Allergen Reactions  . Other Hives     Nut allergy . Walnuts and hazelnuts are the worst.   . Penicillins Itching    Hives   . Percocet [Oxycodone-Acetaminophen] Hives    Hives   . Ambien [Zolpidem]     Hallucinations falls  . Lipitor [Atorvastatin]     Unable to tolerate h/o fatty liver with elevated lfts   . Molds & Smuts Other (See Comments)    Congestion and sneezing   . Aspirin Itching    hives  . Latex Rash    Hives .    Recent Results (from the past 2160 hour(s))  Lipase, blood     Status: None   Collection Time: 01/01/21  2:57 PM  Result Value Ref Range   Lipase 51 11 - 51 U/L    Comment: Performed at [facility], [address] [place], [place] [address]  Comprehensive metabolic panel     Status: Abnormal   Collection Time: 01/01/21  2:57 PM  Result Value Ref Range   Sodium 126 (L) 135 - 145 mmol/L   Potassium 3.3 (L) 3.5 - 5.1 mmol/L   Chloride 90 (L) 98 - 111 mmol/L   CO2 19 (L) 22 - 32 mmol/L   Glucose, Bld 143 (H) 70 - 99 mg/dL    Comment: Glucose reference range applies only to samples taken after fasting for at least 8 hours.   BUN 8 6 - 20 mg/dL   Creatinine, Ser 1.35 (H) 0.44 - 1.00 mg/dL   Calcium 9.1 8.9 - 10.3 mg/dL   Total Protein 7.9 6.5 - 8.1 g/dL   Albumin 4.4 3.5 - 5.0 g/dL   AST 142 (H) 15 - 41 U/L   ALT 74 (H) 0 - 44 U/L   Alkaline Phosphatase 97 38 - 126 U/L   Total Bilirubin 1.2 0.3 - 1.2 mg/dL   GFR, Estimated 49 (L) >60 mL/min     Comment: (NOTE) Calculated using the CKD-EPI Creatinine Equation (2021)    Anion gap 17 (H) 5 - 15    Comment: Performed at [facility], [address] [place], [place] [address]  CBC     Status: Abnormal   Collection Time: 01/01/21  2:57 PM  Result Value Ref Range   WBC 11.4 (H) 4.0 - 10.5 K/uL   RBC 4.13 3.87 - 5.11 MIL/uL   Hemoglobin 13.8 12.0 - 15.0 g/dL   HCT 38.9 36.0 - 46.0 %   MCV 94.2 80.0 - 100.0 fL   MCH 33.4 26.0 - 34.0 pg   MCHC 35.5 30.0 - 36.0 g/dL   RDW 14.7 11.5 - 15.5 %   Platelets 358 150 - 400 K/uL   nRBC 0.0 0.0 - 0.2 %    Comment: Performed at [facility], [address] [place],  [address]  Urinalysis, Complete w Microscopic     Status: Abnormal   Collection Time: 01/01/21  2:57 PM  Result Value Ref Range   Color, Urine STRAW (A) YELLOW   APPearance CLEAR (A) CLEAR   Specific Gravity, Urine 1.003 (L) 1.005 - 1.030   pH 6.0 5.0 - 8.0   Glucose, UA NEGATIVE NEGATIVE mg/dL   Hgb urine dipstick NEGATIVE NEGATIVE   Bilirubin Urine NEGATIVE NEGATIVE   Ketones, ur NEGATIVE NEGATIVE mg/dL   Protein, ur NEGATIVE NEGATIVE mg/dL   Nitrite NEGATIVE NEGATIVE  Leukocytes,Ua NEGATIVE NEGATIVE   RBC / HPF 0-5 0 - 5 RBC/hpf   WBC, UA 0-5 0 - 5 WBC/hpf   Bacteria, UA RARE (A) NONE SEEN   Squamous Epithelial / LPF NONE SEEN 0 - 5   Mucus PRESENT     Comment: Performed at [facility], [address] [place], [place] [address]  hCG, quantitative, pregnancy     Status: None   Collection Time: 01/01/21  2:57 PM  Result Value Ref Range   hCG, Beta Chain, Quant, S <1 <5 mIU/mL    Comment:          GEST. AGE      CONC.  (mIU/mL)   <=1 WEEK        5 - 50     2 WEEKS       50 - 500     3 WEEKS       100 - 10,000     4 WEEKS     1,000 - 30,000     5 WEEKS     3,500 - 115,000   6-8 WEEKS     12,000 - 270,000    12 WEEKS     15,000 - 220,000        FEMALE AND NON-PREGNANT FEMALE:     LESS THAN 5 mIU/mL Performed at [facility], [address] [place], [place] [address]   POC urine preg, ED     Status: None   Collection Time: 01/01/21  5:18 PM  Result Value Ref Range   Preg Test, Ur Negative Negative  Comprehensive metabolic panel     Status: Abnormal   Collection Time: 01/22/21  7:25 AM  Result Value Ref Range   Sodium 133 (L) 135 - 145 mmol/L   Potassium 3.1 (L) 3.5 - 5.1 mmol/L   Chloride 101 98 - 111 mmol/L   CO2 21 (L) 22 - 32 mmol/L   Glucose, Bld 90 70 - 99 mg/dL    Comment: Glucose reference range applies only to samples taken after fasting for at least 8 hours.   BUN 8 6 - 20 mg/dL   Creatinine, Ser 0.84 0.44 - 1.00 mg/dL   Calcium 8.8 (L) 8.9 - 10.3 mg/dL   Total Protein 7.3 6.5 - 8.1 g/dL   Albumin 3.9 3.5 - 5.0 g/dL   AST 57 (H) 15 - 41 U/L   ALT 38 0 - 44 U/L   Alkaline Phosphatase 80 38 - 126 U/L   Total Bilirubin 0.8 0.3 - 1.2 mg/dL   GFR, Estimated >60 >60 mL/min    Comment: (NOTE) Calculated using the CKD-EPI Creatinine Equation (2021)    Anion gap 11 5 - 15    Comment: Performed at [facility], [address], [place] [address]  Troponin I (High Sensitivity)     Status: None   Collection Time: 01/22/21  7:25 AM  Result Value Ref Range   Troponin I (High Sensitivity) 3 <18 ng/L    Comment: (NOTE) Elevated high sensitivity troponin I (hsTnI) values and significant  changes across serial measurements may suggest ACS but many other  chronic and acute conditions are known to elevate hsTnI results.  Refer to the "Links" section for chest pain algorithms and additional  guidance. Performed at [facility], [address] [place], [place] [address]   CBC with Differential     Status: Abnormal   Collection Time: 01/22/21  7:25 AM  Result Value  Ref Range   WBC 8.1 4.0 - 10.5 K/uL   RBC 3.55 (L) 3.87 - 5.11 MIL/uL   Hemoglobin 11.9 (L) 12.0 - 15.0 g/dL   HCT 33.5 (L) 36.0 - 46.0 %   MCV 94.4 80.0 - 100.0 fL   MCH 33.5 26.0 - 34.0 pg    MCHC 35.5 30.0 - 36.0 g/dL   RDW 15.1 11.5 - 15.5 %   Platelets 235 150 - 400 K/uL   nRBC 0.0 0.0 - 0.2 %   Neutrophils Relative % 59 %   Neutro Abs 4.8 1.7 - 7.7 K/uL   Lymphocytes Relative 31 %   Lymphs Abs 2.6 0.7 - 4.0 K/uL   Monocytes Relative 5 %   Monocytes Absolute 0.4 0.1 - 1.0 K/uL   Eosinophils Relative 4 %   Eosinophils Absolute 0.3 0.0 - 0.5 K/uL   Basophils Relative 1 %   Basophils Absolute 0.1 0.0 - 0.1 K/uL   Immature Granulocytes 0 %   Abs Immature Granulocytes 0.03 0.00 - 0.07 K/uL    Comment: Performed at [facility], [address] [place], [place] [address]  Comprehensive metabolic panel     Status: Abnormal   Collection Time: 03/05/21  9:22 AM  Result Value Ref Range   Sodium 131 (L) 135 - 145 mEq/L   Potassium 3.3 (L) 3.5 - 5.1 mEq/L   Chloride 92 (L) 96 - 112 mEq/L   CO2 26 19 - 32 mEq/L   Glucose, Bld 89 70 - 99 mg/dL   BUN 16 6 - 23 mg/dL   Creatinine, Ser 1.06 0.40 - 1.20 mg/dL   Total Bilirubin 0.4 0.2 - 1.2 mg/dL   Alkaline Phosphatase 107 39 - 117 U/L   AST 54 (H) 0 - 37 U/L   ALT 40 (H) 0 - 35 U/L   Total Protein 6.9 6.0 - 8.3 g/dL   Albumin 4.3 3.5 - 5.2 g/dL   GFR 63.34 >60.00 mL/min    Comment: Calculated using the CKD-EPI Creatinine Equation (2021)   Calcium 9.3 8.4 - 10.5 mg/dL  Lipid panel     Status: Abnormal   Collection Time: 03/05/21  9:22 AM  Result Value Ref Range   Cholesterol 210 (H) 0 - 200 mg/dL    Comment: ATP III Classification       Desirable:  < 200 mg/dL               Borderline High:  200 - 239 mg/dL          High:  > = 240 mg/dL   Triglycerides 169.0 (H) 0.0 - 149.0 mg/dL    Comment: Normal:  <150 mg/dLBorderline High:  150 - 199 mg/dL   HDL 79.60 >39.00 mg/dL   VLDL 33.8 0.0 - 40.0 mg/dL   LDL Cholesterol 97 0 - 99 mg/dL   Total CHOL/HDL Ratio 3     Comment:                Men          Women1/2 Average Risk     3.4          3.3Average Risk          5.0          4.42X Average Risk          9.6           7.13X Average Risk          15.0  11.0                       NonHDL 130.82     Comment: NOTE:  Non-HDL goal should be 30 mg/dL higher than patient's LDL goal (i.e. LDL goal of < 70 mg/dL, would have non-HDL goal of < 100 mg/dL)  Hemoglobin A1c     Status: None   Collection Time: 03/05/21  9:22 AM  Result Value Ref Range   Hgb A1c MFr Bld 5.5 4.6 - 6.5 %    Comment: Glycemic Control Guidelines for People with Diabetes:Non Diabetic:  <6%Goal of Therapy: <7%Additional Action Suggested:  >8%   Vitamin D (25 hydroxy)     Status: None   Collection Time: 03/05/21  9:22 AM  Result Value Ref Range   VITD 30.55 30.00 - 100.00 ng/mL  B12     Status: Abnormal   Collection Time: 03/05/21  9:22 AM  Result Value Ref Range   Vitamin B-12 150 (L) 211 - 911 pg/mL  Magnesium     Status: None   Collection Time: 03/05/21  9:22 AM  Result Value Ref Range   Magnesium 2.0 1.5 - 2.5 mg/dL  Insulin, random     Status: None   Collection Time: 03/05/21  9:22 AM  Result Value Ref Range   Insulin 5.6 uIU/mL    Comment:      Reference Range  < or = 19.6 .      Risk:      Optimal          < or = 19.6      Moderate         NA      High             >19.6 .      Adult cardiovascular event risk category      cut points (optimal, moderate, high)      are based on [employer] population      data from 09/2010. . This insulin assay shows strong cross-reactivity for some insulin analogs (lispro, aspart, and glargine) and much lower cross-reactivity with others (detemir, glulisine).    Objective  Body mass index is 40.03 kg/m. Wt Readings from Last 3 Encounters:  03/05/21 226 lb (102.5 kg)  01/22/21 224 lb (101.6 kg)  01/01/21 222 lb (100.7 kg)   Temp Readings from Last 3 Encounters:  03/05/21 97.7 F (36.5 C) (Oral)  01/22/21 98.2 F (36.8 C) (Oral)  01/01/21 97.9 F (36.6 C) (Oral)   BP Readings from Last 3 Encounters:  03/05/21 (!) 88/60  01/22/21 124/78  01/01/21 107/73    Pulse Readings from Last 3 Encounters:  03/05/21 100  01/22/21 100  01/01/21 (!) 111    Physical Exam Vitals and nursing note reviewed.  Constitutional:      Appearance: Normal appearance. She is well-developed and well-groomed.  HENT:     Head: Normocephalic and atraumatic.  Eyes:     Conjunctiva/sclera: Conjunctivae normal.     Pupils: Pupils are equal, round, and reactive to light.  Cardiovascular:     Rate and Rhythm: Normal rate and regular rhythm.     Heart sounds: No murmur heard.   Pulmonary:     Effort: Pulmonary effort is normal.     Breath sounds: Normal breath sounds.  Abdominal:     General: Abdomen is flat. Bowel sounds are normal.  Skin:    General: Skin is warm and dry.  Neurological:  General: No focal deficit present.     Mental Status: She is alert and oriented to person, place, and time. Mental status is at baseline.     Gait: Gait normal.  Psychiatric:        Attention and Perception: Attention and perception normal.        Mood and Affect: Mood and affect normal.        Speech: Speech normal.        Behavior: Behavior normal. Behavior is cooperative.        Thought Content: Thought content normal.        Cognition and Memory: Cognition and memory normal.        Judgment: Judgment normal.     Assessment  Plan  Atypical chest pain - Plan: Ambulatory referral to Cardiology FH: heart disease - Plan: Ambulatory referral to Cardiology rec  Pt did not pick up phone for referral needs to see cards and likely GI  Hypertension,with hypotension  - Plan: Ambulatory referral to Cardiology, olmesartan-hydrochlorothiazide (BENICAR HCT) 20-12.5 MG tablet reduce dose in 1/2, amLODipine (NORVASC) 5 MG tablet, Comprehensive metabolic panel, Lipid panel  Hyperlipidemia, unspecified hyperlipidemia type - Plan: Ambulatory referral to Cardiology  Fatty liver with h/o elevated lfts and enlarged liver   Scoliosis of thoracic spine, unspecified scoliosis  type Arthritis of lumbar spine  Hyperglycemia - Plan: Hemoglobin A1c  Vitamin D deficiency - Plan: Vitamin D (25 hydroxy)  B12 deficiency - Plan: B12  Hypokalemia - Plan: Comprehensive metabolic panel, Magnesium  Morbid obesity ([place]) - Plan: Hemoglobin A1c, Insulin, random Wt loss clinic f/u sch 04/01/21  Chronic pain of left ankle Done with PT   Anxiety and depression  rec [facility] therapy and psych  Ativan 0.5 qd prn and effexor 150 xl    HM Declinedflu shot  Tdaphad Declines hep B status checkand MMR check moderna 2/2 consider booster Declines STD checkhep C/HIV  Pap had 09/07/17 Dr. Marcelline Mates get records appt sch 10/28/20neg neg HPV  mammo abnormal 01/2018 repeat dx mammo and [place] left, had 07/19/18 repeat ordered 4/2/2020dx mammo repeat 4/5//21neg Screening 4/2022sch 04/29/21  EGD/colonoscpy had 05/01/19 [place] bx negative DEXA age 67  rec healthy diet and exercise   Provider: Dr. Olivia Mackie McLean-Scocuzza-Internal Medicine

## 2021-03-06 LAB — INSULIN, RANDOM: Insulin: 5.6 u[IU]/mL

## 2021-03-10 ENCOUNTER — Encounter: Payer: Self-pay | Admitting: Internal Medicine

## 2021-03-12 ENCOUNTER — Other Ambulatory Visit: Payer: Self-pay | Admitting: Internal Medicine

## 2021-03-12 ENCOUNTER — Ambulatory Visit: Payer: Self-pay

## 2021-03-12 DIAGNOSIS — F32A Depression, unspecified: Secondary | ICD-10-CM

## 2021-03-12 DIAGNOSIS — F419 Anxiety disorder, unspecified: Secondary | ICD-10-CM

## 2021-03-18 ENCOUNTER — Encounter: Payer: Self-pay | Admitting: Internal Medicine

## 2021-03-18 NOTE — Telephone Encounter (Signed)
Please advise, does Patient need an appointment or a referral?

## 2021-03-19 ENCOUNTER — Telehealth: Payer: Self-pay

## 2021-03-19 DIAGNOSIS — M47816 Spondylosis without myelopathy or radiculopathy, lumbar region: Secondary | ICD-10-CM | POA: Insufficient documentation

## 2021-03-19 DIAGNOSIS — E785 Hyperlipidemia, unspecified: Secondary | ICD-10-CM | POA: Insufficient documentation

## 2021-03-19 DIAGNOSIS — M419 Scoliosis, unspecified: Secondary | ICD-10-CM | POA: Insufficient documentation

## 2021-03-19 NOTE — Telephone Encounter (Signed)
Ginger- Please call this patient and see if she is still having bleeding. Her colonoscopy was 2 years ago for diarrhea and her hemorrhoids were not that bad. Have her see me Tuesday if still bleeding and go to the ER if a lot of blood is passed.

## 2021-03-19 NOTE — Addendum Note (Signed)
Addended by: Orland Mustard on: 03/19/2021 08:09 AM   Modules accepted: Orders

## 2021-03-19 NOTE — Telephone Encounter (Signed)
Go to the ED and call for appt Dr. Allen Norris

## 2021-03-19 NOTE — Telephone Encounter (Signed)
Patient reports blood in stool for the second time this week. She has not been to the ED or any walk in clinics visible on Epic. Access nurse recommends she sees her PCP within 24 hours.

## 2021-03-20 ENCOUNTER — Encounter: Payer: Self-pay | Admitting: Internal Medicine

## 2021-03-20 ENCOUNTER — Other Ambulatory Visit: Payer: Self-pay | Admitting: Internal Medicine

## 2021-03-20 DIAGNOSIS — E538 Deficiency of other specified B group vitamins: Secondary | ICD-10-CM

## 2021-03-20 MED ORDER — "NEEDLE (DISP) 25G X 1-1/2"" MISC": 1 refills | Status: DC

## 2021-03-20 MED ORDER — CYANOCOBALAMIN 1000 MCG/ML IJ SOLN: INTRAMUSCULAR | 1 refills | Status: DC

## 2021-03-20 NOTE — Telephone Encounter (Signed)
Called pt back and she stated that she is having bright red blood when she wipes. Not passing a lot of blood. Pt has been scheduled with Dr. Allen Norris on June 7th per Dr. Dorothey Baseman request.

## 2021-03-24 ENCOUNTER — Other Ambulatory Visit: Payer: Self-pay

## 2021-03-24 ENCOUNTER — Ambulatory Visit: Payer: 59 | Admitting: Gastroenterology

## 2021-03-24 ENCOUNTER — Encounter: Payer: Self-pay | Admitting: Gastroenterology

## 2021-03-24 VITALS — BP 116/79 | HR 121 | Ht 63.0 in | Wt 223.0 lb

## 2021-03-24 DIAGNOSIS — K921 Melena: Secondary | ICD-10-CM

## 2021-03-24 NOTE — Progress Notes (Signed)
Primary Care Physician: McLean-Scocuzza, Nino Glow, MD  Primary Gastroenterologist:  Dr. Lucilla Lame  Chief Complaint  Patient presents with  . Rectal Bleeding    HPI: Valerie Morales is a 45 y.o. female here for rectal bleeding.  The patient had contacted her primary care provider because she was having rectal bleeding. The patient reports that the rectal bleeding last 3 days.  She has had no further bleeding since then.  She had been contacted by my office within the last week and was told to come in today because of rectal bleeding.  She denies having any constipation prior to having the rectal bleeding.  She also reports that the rectal bleeding was bright red blood without any blood mixed with the stools and was located on the toilet paper only.  There is no abdominal pain associated with it. The patient has also had elevated liver enzymes with her most recent liver enzymes being 54 for her AST and 40 for her ALT.  The patient's AST has always been higher than her ALT and her ultrasound has shown her to have fatty liver. The patient states that she drinks multiple 12 ounce cans of beer a night.    Past Medical History:  Diagnosis Date  . Anemia    has required 2 units PRBC  . Anxiety   . B12 deficiency   . Chicken pox   . Depression    trazadone has not helped in the past   . Fatty liver   . Headache   . History of blood transfusion 2009   [facility]  . Hypertension   . MRSA (methicillin resistant Staphylococcus aureus)   . Sinusitis   . Sinusitis, chronic   . UTI (urinary tract infection)   . Vitamin D deficiency     Current Outpatient Medications  Medication Sig Dispense Refill  . acetaminophen (TYLENOL) 650 MG CR tablet     . amLODipine (NORVASC) 5 MG tablet Take 1 tablet (5 mg total) by mouth in the morning and at bedtime. Take 2nd dose if BP >130/>80 in the pm 180 tablet 3  . cetirizine (ZYRTEC) 10 MG tablet Take 10 mg by mouth daily.    . cyanocobalamin  (,VITAMIN B-12,) 1000 MCG/ML injection inject B12 1x per week x 1 month then every 30 days 12 mL 1  . diphenhydrAMINE (BENADRYL) 25 MG tablet Take 25 mg by mouth daily as needed for allergies.     [name] Kitchen etodolac (LODINE) 400 MG tablet Take by mouth.    . famotidine (PEPCID) 20 MG tablet Take 1 tablet (20 mg total) by mouth 2 (two) times daily. 60 tablet 1  . LORazepam (ATIVAN) 0.5 MG tablet Take 1 tablet (0.5 mg total) by mouth daily as needed for anxiety. 30 tablet 2  . Multiple Vitamins-Minerals (MULTIVITAMIN WITH MINERALS) tablet Take 1 tablet by mouth daily. Woman    . NEEDLE, DISP, 25 G 25G X 1-1/2" MISC Use with syringe to inject B12 1x per week x 1 month then every 30 days 12 each 1  . olmesartan-hydrochlorothiazide (BENICAR HCT) 20-12.5 MG tablet Take 1 tablet by mouth daily. D/c 40-25 in am 90 tablet 3  . ondansetron (ZOFRAN) 4 MG tablet Take 1 tablet (4 mg total) by mouth every 8 (eight) hours as needed for nausea or vomiting. 40 tablet 0  . sucralfate (CARAFATE) 1 GM/10ML suspension Take 10 mLs (1 g total) by mouth 4 (four) times daily. 420 mL 1  . tiZANidine (  ZANAFLEX) 4 MG capsule Take 1 capsule (4 mg total) by mouth at bedtime as needed for muscle spasms. 30 capsule 0  . venlafaxine XR (EFFEXOR-XR) 150 MG 24 hr capsule TAKE 1 CAPSULE BY MOUTH ONCE DAILY WITH BREAKFAST 30 capsule 0   No current facility-administered medications for this visit.    Allergies as of 03/24/2021 - Review Complete 03/24/2021  Allergen Reaction Noted  . Other Hives 12/18/2017  . Penicillins Itching 12/21/2013  . Percocet [oxycodone-acetaminophen] Hives 02/21/2012  . Ambien [zolpidem]  11/20/2020  . Lipitor [atorvastatin]  07/27/2020  . Molds & smuts Other (See Comments) 03/05/2021  . Aspirin Itching 02/21/2012  . Latex Rash 02/21/2012    ROS:  General: Negative for anorexia, weight loss, fever, chills, fatigue, weakness. ENT: Negative for hoarseness, difficulty swallowing , nasal congestion. CV:  Negative for chest pain, angina, palpitations, dyspnea on exertion, peripheral edema.  Respiratory: Negative for dyspnea at rest, dyspnea on exertion, cough, sputum, wheezing.  GI: See history of present illness. GU:  Negative for dysuria, hematuria, urinary incontinence, urinary frequency, nocturnal urination.  Endo: Negative for unusual weight change.    Physical Examination:   BP 116/79   Pulse (!) 121   Ht 5\' 3"  (1.6 m)   Wt 223 lb (101.2 kg)   BMI 39.50 kg/m   General: Well-nourished, well-developed in no acute distress.  Eyes: No icterus. Conjunctivae pink. Neuro: Alert and oriented x 3.  Grossly intact. Skin: Warm and dry, no jaundice.   Psych: Alert and cooperative, normal mood and affect.  Labs:    Imaging Studies: No results found.  Assessment and Plan:   Valerie Morales is a 45 y.o. y/o female who comes in today with a three-day history of rectal bleeding.  She has had no further signs of rectal bleeding.  The blood was bright red and on the toilet paper consistent with hemorrhoidal bleeding.  The patient had a colonoscopy in 2020 without any cause for the rectal bleeding seen.  The patient has been told to avoid pushing and straining and to contact me if her bleeding returns.   The patient has also been told that the levels of her liver enzymes in conjunction with the AST being higher than the ALT and the imaging showed her to have fatty liver are all consistent with her alcohol use.  The patient has been told to stop her alcohol use and have her liver enzymes checked in one month. The patient has been explained the plan and agrees with it.     Lucilla Lame, MD. Marval Regal    Note: This dictation was prepared with Dragon dictation along with smaller phrase technology. Any transcriptional errors that result from this process are unintentional.

## 2021-03-26 ENCOUNTER — Other Ambulatory Visit (INDEPENDENT_AMBULATORY_CARE_PROVIDER_SITE_OTHER): Payer: 59

## 2021-03-26 ENCOUNTER — Other Ambulatory Visit: Payer: Self-pay

## 2021-03-26 DIAGNOSIS — E871 Hypo-osmolality and hyponatremia: Secondary | ICD-10-CM

## 2021-03-26 DIAGNOSIS — R748 Abnormal levels of other serum enzymes: Secondary | ICD-10-CM

## 2021-03-26 LAB — COMPREHENSIVE METABOLIC PANEL
ALT: 49 U/L — ABNORMAL HIGH (ref 0–35)
AST: 67 U/L — ABNORMAL HIGH (ref 0–37)
Albumin: 4.2 g/dL (ref 3.5–5.2)
Alkaline Phosphatase: 92 U/L (ref 39–117)
BUN: 10 mg/dL (ref 6–23)
CO2: 28 mEq/L (ref 19–32)
Calcium: 9 mg/dL (ref 8.4–10.5)
Chloride: 99 mEq/L (ref 96–112)
Creatinine, Ser: 0.77 mg/dL (ref 0.40–1.20)
GFR: 92.92 mL/min (ref >60.00)
Glucose, Bld: 87 mg/dL (ref 70–99)
Potassium: 3.5 mEq/L (ref 3.5–5.1)
Sodium: 137 mEq/L (ref 135–145)
Total Bilirubin: 0.3 mg/dL (ref 0.2–1.2)
Total Protein: 6.9 g/dL (ref 6.0–8.3)

## 2021-03-27 LAB — MICROALBUMIN / CREATININE URINE RATIO
Creatinine, Urine: 36 mg/dL (ref 20–275)
Microalb Creat Ratio: 6 mcg/mg creat (ref <30)
Microalb, Ur: 0.2 mg/dL

## 2021-03-27 LAB — SODIUM, URINE, RANDOM: Sodium, Ur: 26 mmol/L — ABNORMAL LOW (ref 28–272)

## 2021-04-01 ENCOUNTER — Ambulatory Visit (INDEPENDENT_AMBULATORY_CARE_PROVIDER_SITE_OTHER): Payer: 59 | Admitting: Family Medicine

## 2021-04-01 DIAGNOSIS — Z0289 Encounter for other administrative examinations: Secondary | ICD-10-CM

## 2021-04-06 ENCOUNTER — Ambulatory Visit: Payer: 59 | Admitting: Cardiology

## 2021-04-07 ENCOUNTER — Encounter: Payer: Self-pay | Admitting: Cardiology

## 2021-04-08 ENCOUNTER — Encounter: Payer: Self-pay | Admitting: Internal Medicine

## 2021-04-15 ENCOUNTER — Ambulatory Visit (INDEPENDENT_AMBULATORY_CARE_PROVIDER_SITE_OTHER): Payer: 59 | Admitting: Family Medicine

## 2021-04-23 ENCOUNTER — Other Ambulatory Visit: Payer: Self-pay | Admitting: Internal Medicine

## 2021-04-23 DIAGNOSIS — F32A Depression, unspecified: Secondary | ICD-10-CM

## 2021-04-23 DIAGNOSIS — F419 Anxiety disorder, unspecified: Secondary | ICD-10-CM

## 2021-04-29 ENCOUNTER — Other Ambulatory Visit: Payer: Self-pay

## 2021-04-29 ENCOUNTER — Ambulatory Visit
Admission: RE | Admit: 2021-04-29 | Discharge: 2021-04-29 | Disposition: A | Discharge status: Home / Self Care | Payer: 59 | Source: Ambulatory Visit | Attending: Internal Medicine | Admitting: Internal Medicine

## 2021-04-29 DIAGNOSIS — Z1231 Encounter for screening mammogram for malignant neoplasm of breast: Secondary | ICD-10-CM

## 2021-05-05 ENCOUNTER — Encounter (INDEPENDENT_AMBULATORY_CARE_PROVIDER_SITE_OTHER): Payer: Self-pay | Admitting: Family Medicine

## 2021-05-05 ENCOUNTER — Ambulatory Visit (INDEPENDENT_AMBULATORY_CARE_PROVIDER_SITE_OTHER): Payer: 59 | Admitting: Family Medicine

## 2021-05-05 ENCOUNTER — Other Ambulatory Visit: Payer: Self-pay

## 2021-05-05 VITALS — BP 98/61 | HR 90 | Temp 97.9°F | Ht 63.0 in | Wt 213.0 lb

## 2021-05-05 DIAGNOSIS — R0602 Shortness of breath: Secondary | ICD-10-CM

## 2021-05-05 DIAGNOSIS — K219 Gastro-esophageal reflux disease without esophagitis: Secondary | ICD-10-CM

## 2021-05-05 DIAGNOSIS — E559 Vitamin D deficiency, unspecified: Secondary | ICD-10-CM

## 2021-05-05 DIAGNOSIS — R5383 Other fatigue: Secondary | ICD-10-CM | POA: Diagnosis not present

## 2021-05-05 DIAGNOSIS — G4733 Obstructive sleep apnea (adult) (pediatric): Secondary | ICD-10-CM

## 2021-05-05 DIAGNOSIS — Z9189 Other specified personal risk factors, not elsewhere classified: Secondary | ICD-10-CM

## 2021-05-05 DIAGNOSIS — F1010 Alcohol abuse, uncomplicated: Secondary | ICD-10-CM | POA: Insufficient documentation

## 2021-05-05 DIAGNOSIS — E538 Deficiency of other specified B group vitamins: Secondary | ICD-10-CM

## 2021-05-05 DIAGNOSIS — Z9889 Other specified postprocedural states: Secondary | ICD-10-CM

## 2021-05-05 DIAGNOSIS — Z1331 Encounter for screening for depression: Secondary | ICD-10-CM | POA: Diagnosis not present

## 2021-05-05 DIAGNOSIS — I10 Essential (primary) hypertension: Secondary | ICD-10-CM | POA: Diagnosis not present

## 2021-05-05 DIAGNOSIS — F319 Bipolar disorder, unspecified: Secondary | ICD-10-CM

## 2021-05-05 DIAGNOSIS — Z6837 Body mass index (BMI) 37.0-37.9, adult: Secondary | ICD-10-CM

## 2021-05-06 ENCOUNTER — Other Ambulatory Visit: Payer: Self-pay | Admitting: Internal Medicine

## 2021-05-06 DIAGNOSIS — R110 Nausea: Secondary | ICD-10-CM

## 2021-05-07 MED ORDER — ONDANSETRON HCL 4 MG PO TABS: 4.0000 mg | ORAL_TABLET | Freq: Three times a day (TID) | ORAL | 2 refills | Status: DC | PRN

## 2021-05-10 ENCOUNTER — Encounter (INDEPENDENT_AMBULATORY_CARE_PROVIDER_SITE_OTHER): Payer: Self-pay | Admitting: Family Medicine

## 2021-05-11 NOTE — Telephone Encounter (Signed)
Last OV with Dr Opalski 

## 2021-05-13 LAB — VITAMIN B1: Thiamine: 117.1 nmol/L (ref 66.5–200.0)

## 2021-05-13 LAB — TSH: TSH: 2.22 u[IU]/mL (ref 0.450–4.500)

## 2021-05-13 LAB — T4, FREE: Free T4: 1.04 ng/dL (ref 0.82–1.77)

## 2021-05-13 LAB — FOLATE: Folate: 2.2 ng/mL — ABNORMAL LOW (ref >3.0)

## 2021-05-14 NOTE — Progress Notes (Signed)
Dear Dr. Terese Door,   Thank you for referring Valerie Morales to our clinic. The following note includes my evaluation and treatment recommendations.  Chief Complaint:   OBESITY Valerie Morales (MR# [id number]) is a 45 y.o. female who presents for evaluation and treatment of obesity and related comorbidities. Current BMI is Body mass index is 37.73 kg/m. Valerie Morales has been struggling with her weight for many years and has been unsuccessful in either losing weight, maintaining weight loss, or reaching her healthy weight goal.  Valerie Morales is currently in the action stage of change and ready to dedicate time achieving and maintaining a healthier weight. Valerie Morales is interested in becoming our patient and working on intensive lifestyle modifications including (but not limited to) diet and exercise for weight loss.  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  She lives with her husband, Valerie Morales, and their 71 and 39 year old children.  Craves candy and potatoes.  Snacks on chips and fruit.  Skips dinner every other day.  Drinks a lot of caloric beverages, including alcoholic beverages daily.  She usually drinks scotch whiskey or cognac or gin or vodka or tequila.  Worst habit is her love for french fries and fried chicken.  She is here because her PCP told her to come.  Xoe's habits were reviewed today and are as follows: Her family eats meals together, she thinks her family will eat healthier with her, her desired weight loss is 47 pounds, she has been heavy most of her life, she started gaining weight around age 40, her heaviest weight ever was 297 pounds, she is a picky eater and doesn't like to eat healthier foods, she craves potatoes and candy, she snacks frequently in the evenings, she wakes up frequently in the middle of the night to eat, she skips dinner frequently, she is frequently drinking liquids with calories, she frequently makes poor food choices, and she  struggles with emotional eating.  Depression Screen Valerie Morales Food and Mood (modified PHQ-9) score was 6.  Depression screen [facility] 2/9 05/05/2021  Decreased Interest 0  Down, Depressed, Hopeless 1  PHQ - 2 Score 1  Altered sleeping 1  Tired, decreased energy 1  Change in appetite 1  Feeling bad or failure about yourself  1  Trouble concentrating 1  Moving slowly or fidgety/restless 0  Suicidal thoughts 0  PHQ-9 Score 6  Difficult doing work/chores Not difficult at all   Assessment/Plan:   Orders Placed This Encounter  Procedures   Folate   T4, free   TSH   Vitamin B1   Medications Discontinued During This Encounter  Medication Reason   Multiple Vitamins-Minerals (MULTIVITAMIN WITH MINERALS) tablet Error   acetaminophen (TYLENOL) 650 MG CR tablet Error   NEEDLE, DISP, 25 G 25G X 1-1/2" MISC Error    1. Other fatigue Ladye denies daytime somnolence and reports waking up still tired. Patent has a history of symptoms of daytime fatigue, morning fatigue, and snoring. Trinidi generally gets 5 or 6 hours of sleep per night, and states that she has poor quality sleep. Snoring is present. Apneic episodes are present. Epworth Sleepiness Score is 2.  Valerie Morales does feel that her weight is causing her energy to be lower than it should be. Fatigue may be related to obesity, depression or many other causes. Labs will be ordered, and in the meanwhile, Ngan will focus on self care including making healthy food choices, increasing physical activity and focusing on stress reduction.  -  T4, free - TSH  2. Shortness of breath on exertion Valerie Morales notes increasing shortness of breath with exercising and seems to be worsening over time with weight gain. She notes getting out of breath sooner with activity than she used to. This has gotten worse recently. Cheresa denies shortness of breath at rest or orthopnea.  Valerie Morales does feel that she gets out of breath more easily that she  used to when she exercises. Valerie Morales's shortness of breath appears to be obesity related and exercise induced. She has agreed to work on weight loss and gradually increase exercise to treat her exercise induced shortness of breath. Will continue to monitor closely.  3. Hypertension, unspecified type At goal. Medications: Norvasc 5 mg daily, Benicar 20-12.5 mg daily.  Denies concerns or new symptoms.  Plan: Avoid buying foods that are: processed, frozen, or prepackaged to avoid excess salt. We will watch for signs of hypotension as she continues lifestyle modifications. We will continue to monitor closely alongside her PCP and/or Specialist.  Regular follow up with PCP and specialists was also encouraged.  Check labs today.  BP Readings from Last 3 Encounters:  05/05/21 98/61  03/24/21 116/79  03/05/21 (!) 88/60   Lab Results  Component Value Date   CREATININE 0.77 03/26/2021   4. Gastroesophageal reflux disease, unspecified whether esophagitis present With IBS, gastric bypass surgery, NAFLD.  No history of gastric ulcer.  Taking Zofran, Pepcid, sucralfate, and Lodine.  Plan:  Decrease ETOH intake.  Follow prudent nutritional plan.  We reviewed the diagnosis of GERD and importance of treatment. We discussed "red flag" symptoms and the importance of follow up if symptoms persisted despite treatment. We reviewed non-pharmacologic management of GERD symptoms: including: caffeine reduction, dietary changes, elevate HOB, NPO after supper, reduction of alcohol intake, tobacco cessation, and weight loss.  5. Vitamin D deficiency Not at goal.  On OTC vitamin D.  Unsure of dose.  Taken daily.  Also history of B12 deficiency as well.  Taking vitamin B12 1,000 mcg weekly.  Plan: Continue current OTC vitamin D supplementation.  Will discuss her recent lab results from her PCP's office at her next visit.  Lab Results  Component Value Date   VD25OH 30.55 03/05/2021   VD25OH 35.97 07/25/2019    VD25OH 7.9 (L) 12/16/2017   6. OSA (obstructive sleep apnea) No mask needed, per patient, because she was told it was mild OSA and she opted to go to the dentist for a mouth guard, but she never went.  OSA is a cause of systemic hypertension and is associated with an increased incidence of stroke, heart failure, atrial fibrillation, and coronary heart disease. Severe OSA increases all-cause mortality and cardiovascular mortality.   Goal: Treatment of OSA via CPAP compliance and weight loss. Plasma ghrelin levels (appetite or "hunger hormone") are significantly higher in OSA patients than in BMI-matched controls, but decrease to levels similar to those of obese patients without OSA after CPAP treatment.  Weight loss improves OSA by several mechanisms, including reduction in fatty tissue in the throat (i.e. parapharyngeal fat) and the tongue. Loss of abdominal fat increases mediastinal traction on the upper airway making it less likely to collapse during sleep. Studies have also shown that compliance with CPAP treatment improves leptin (hunger inhibitory hormone) imbalance.  7. Status post gastric surgery In 2003.  Counseling You may need to eat 3 meals and 2 snacks, or 5 small meals each day in order to reach your protein and calorie goals.  Allow at  least 15 minutes for each meal so that you can eat mindfully. Listen to your body so that you do not overeat. For most people, your sleeve or pouch will comfortably hold 4-6 ounces. Eat foods from all food groups. This includes fruits and vegetables, grains, dairy, and meat and other proteins. Include a protein-rich food at every meal and snack, and eat the protein food first.  You should be taking a Bariatric Multivitamin as well as calcium.   8. Alcohol abuse For 1 year now she has had 8 drinks per night, but prior to [employer] job, only 3-4 per night.  Positive family history of alcoholism.  Plan:  Recommend intensive counseling for ETOH abuse.  -  Folate - Vitamin B1  9. Bipolar depression ([place]) With anxiety as well.  Had counselor in the past, but just stopped because she already knew what she needed to do, but could not do it.  Taking Effexor 150 mg daily and Ativan 0.5 mg daily as needed for anxiety.  Plan:  in EMR, she recently was recommended to see Psychiatry and a counselor.  I completely support that today.  I recommend she see an abuse specialist.  Continue medications per PCP.  Decrease ETOH intake.  10. Depression screening Valerie Morales was screened for depression as part of her new patient workup today.  PHQ-9 is 6.  Valerie Morales had a positive depression screening. Depression is commonly associated with obesity and often results in emotional eating behaviors. We will monitor this closely and work on CBT to help improve the non-hunger eating patterns. Referral to Psychology may be required if no improvement is seen as she continues in our clinic.  11. At risk for malnutrition Valerie Morales was given extensive malnutrition prevention education and counseling today of more than 23 minutes.  Counseled her that malnutrition refers to inappropriate nutrients or not the right balance of nutrients for optimal health.  Discussed with Valerie Morales that it is absolutely possible to be malnourished but yet obese.  Risk factors, including but not limited to, inappropriate dietary choices, difficulty with obtaining food due to physical or financial limitations, and various physical and mental health conditions were reviewed with Valerie Morales.    12. Class 2 severe obesity with serious comorbidity and body mass index (BMI) of 37.0 to 37.9 in adult, unspecified obesity type (HCC)  Valerie Morales is currently in the action stage of change and her goal is to continue with weight loss efforts. I recommend Valerie Morales begin the structured treatment plan as follows:  She has agreed to the Category 2 Plan.  Exercise goals:  As is.    Behavioral  modification strategies: decreasing liquid calories, keeping healthy foods in the home, and ways to avoid night time snacking.  She was informed of the importance of frequent follow-up visits to maximize her success with intensive lifestyle modifications for her multiple health conditions. She was informed we would discuss her lab results at her next visit unless there is a critical issue that needs to be addressed sooner. Valerie Morales agreed to keep her next visit at the agreed upon time to discuss these results.  Objective:   Blood pressure 98/61, pulse 90, temperature 97.9 F (36.6 C), height '5\' 3"'$  (1.6 m), weight 213 lb (96.6 kg), SpO2 98 %. Body mass index is 37.73 kg/m.  Indirect Calorimeter completed today shows a VO2 of 260 and a REE of 1810.  Her calculated basal metabolic rate is [id number] thus her basal metabolic rate is better than expected.  General: Cooperative, alert, well developed, in no acute distress. HEENT: Conjunctivae and lids unremarkable. Cardiovascular: Regular rhythm.  Lungs: Normal work of breathing. Neurologic: No focal deficits.   Lab Results  Component Value Date   CREATININE 0.77 03/26/2021   BUN 10 03/26/2021   NA 137 03/26/2021   K 3.5 03/26/2021   CL 99 03/26/2021   CO2 28 03/26/2021   Lab Results  Component Value Date   ALT 49 (H) 03/26/2021   AST 67 (H) 03/26/2021   ALKPHOS 92 03/26/2021   BILITOT 0.3 03/26/2021   Lab Results  Component Value Date   HGBA1C 5.5 03/05/2021   HGBA1C 5.5 07/23/2020   HGBA1C 5.6 02/07/2020   HGBA1C 5.8 07/25/2019   HGBA1C  11/11/2008    5.1 (NOTE)   The ADA recommends the following therapeutic goal for glycemic   control related to Hgb A1C measurement:   Goal of Therapy:   < 7.0% Hgb A1C   Reference: American Diabetes Association: Clinical Practice   Recommendations 2008, Diabetes Care,  2008, 31:(Suppl 1).   Lab Results  Component Value Date   TSH 2.220 05/05/2021   Lab Results  Component Value Date   CHOL  210 (H) 03/05/2021   HDL 79.60 03/05/2021   LDLCALC 97 03/05/2021   TRIG 169.0 (H) 03/05/2021   CHOLHDL 3 03/05/2021   Lab Results  Component Value Date   VD25OH 30.55 03/05/2021   VD25OH 35.97 07/25/2019   VD25OH 7.9 (L) 12/16/2017   Lab Results  Component Value Date   WBC 8.1 01/22/2021   HGB 11.9 (L) 01/22/2021   HCT 33.5 (L) 01/22/2021   MCV 94.4 01/22/2021   PLT 235 01/22/2021   Lab Results  Component Value Date   IRON 71 12/16/2017   TIBC 430 12/16/2017   FERRITIN 23 12/16/2017   Attestation Statements:   This is the patient's first visit at [facility]. The patient's NEW PATIENT PACKET was reviewed at length. Included in the packet: current and past health history, medications, allergies, ROS, gynecologic history (women only), surgical history, family history, social history, weight history, weight loss surgery history (for those that have had weight loss surgery), nutritional evaluation, mood and food questionnaire, PHQ9, Epworth questionnaire, sleep habits questionnaire, patient life and health improvement goals questionnaire. These will all be scanned into the patient's chart under media.   During the visit, I independently reviewed the patient's EKG, bioimpedance scale results, and indirect calorimeter results. I used this information to tailor a meal plan for the patient that will help her to lose weight and will improve her obesity-related conditions going forward. I performed a medically necessary appropriate examination and/or evaluation. I discussed the assessment and treatment plan with the patient. The patient was provided an opportunity to ask questions and all were answered. The patient agreed with the plan and demonstrated an understanding of the instructions. Labs were ordered at this visit and will be reviewed at the next visit unless more critical results need to be addressed immediately. Clinical information was updated and documented in the EMR.    I, Water quality scientist, CMA, am acting as Location manager for [employer], DO.  I have reviewed the above documentation for accuracy and completeness, and I agree with the above. Marjory Sneddon, D.O.  The [place] was signed into law in 2016 which includes the topic of electronic health records.  This provides immediate access to information in MyChart.  This includes consultation notes, operative notes, office  notes, lab results and pathology reports.  If you have any questions about what you read please let us know at your next visit so we can discuss your concerns and take corrective action if need be.  We are right here with you.

## 2021-05-19 ENCOUNTER — Encounter (INDEPENDENT_AMBULATORY_CARE_PROVIDER_SITE_OTHER): Payer: Self-pay | Admitting: Family Medicine

## 2021-05-19 ENCOUNTER — Ambulatory Visit (INDEPENDENT_AMBULATORY_CARE_PROVIDER_SITE_OTHER): Payer: 59 | Admitting: Family Medicine

## 2021-05-19 ENCOUNTER — Other Ambulatory Visit: Payer: Self-pay

## 2021-05-19 VITALS — BP 123/83 | HR 96 | Temp 97.6°F | Ht 63.0 in | Wt 211.0 lb

## 2021-05-19 DIAGNOSIS — Z9189 Other specified personal risk factors, not elsewhere classified: Secondary | ICD-10-CM

## 2021-05-19 DIAGNOSIS — F1010 Alcohol abuse, uncomplicated: Secondary | ICD-10-CM

## 2021-05-19 DIAGNOSIS — E559 Vitamin D deficiency, unspecified: Secondary | ICD-10-CM | POA: Diagnosis not present

## 2021-05-19 DIAGNOSIS — Z9884 Bariatric surgery status: Secondary | ICD-10-CM

## 2021-05-19 DIAGNOSIS — E538 Deficiency of other specified B group vitamins: Secondary | ICD-10-CM | POA: Diagnosis not present

## 2021-05-19 DIAGNOSIS — I10 Essential (primary) hypertension: Secondary | ICD-10-CM | POA: Diagnosis not present

## 2021-05-19 DIAGNOSIS — Z6837 Body mass index (BMI) 37.0-37.9, adult: Secondary | ICD-10-CM

## 2021-05-19 MED ORDER — VITAMIN D (ERGOCALCIFEROL) 1.25 MG (50000 UNIT) PO CAPS: 50000.0000 [IU] | ORAL_CAPSULE | ORAL | 0 refills | Status: DC

## 2021-05-21 NOTE — Progress Notes (Signed)
Chief Complaint:   OBESITY Valerie Morales is here to discuss her progress with her obesity treatment plan along with follow-up of her obesity related diagnoses. Valerie Morales is on the Category 2 Plan and states she is following her eating plan approximately 60% of the time. Valerie Morales states she has not been exercising.  Today's visit was #: 2 Starting weight: 213 lbs Starting date: 05/05/2021 Today's weight: 211 lbs Today's date: 05/19/2021 Total lbs lost to date: 2 Total lbs lost since last in-office visit: 2  Interim History: Valerie Morales is here today for her first follow-up office visit since starting the program with [place].  All blood work/ lab tests that were recently ordered by myself or an outside provider were reviewed with patient today per their request.   Extended time was spent counseling her on all new disease processes that were discovered or preexisting ones that are affected by BMI. She understands that many of these abnormalities will need to monitored regularly along with the current treatment plan of prudent dietary changes, in which we are making each and every office visit, to improve these health parameters.  Valerie Morales is drinking low carb 95 calorie per can beer and has 2 to 5 beers on average per day.  Subjective:   1. Essential hypertension Valerie Morales is taking Norvasc and Benicar and she is at goal. Her serum creatinine is stable at 0.77. Labs from 03/26/21 were discussed today.  BP Readings from Last 3 Encounters:  05/19/21 123/83  05/05/21 98/61  03/24/21 116/79   Lab Results  Component Value Date   CREATININE 0.77 03/26/2021   CREATININE 1.06 03/05/2021   CREATININE 0.84 01/22/2021   2. Vitamin D deficiency She is currently taking no vitamin D supplement. Her vitamin D level is too low at 30 and she is not at goal. Labs were discussed. She denies nausea, vomiting or muscle weakness.  Lab Results  Component Value Date   VD25OH 30.55 03/05/2021    VD25OH 35.97 07/25/2019   VD25OH 7.9 (L) 12/16/2017   3. Folate deficiency Jax has a folate deficiency that is worsening and she is taking 1 tablet OTC everyday, but is unsure of the dose. Labs were discussed today.  4. B12 deficiency Xavier's PCP prescribed B12 injections as she hasn't gotten them yet. Her B12 level was 150 on 03/05/21 which is worsening. Labs were discussed today.  Lab Results  Component Value Date   VITAMINB12 150 (L) 03/05/2021   5. 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  6. ETOH abuse Noorah's LFT's are still up and are slightly worse than prior. Labs were discussed with pt.  7. At risk for malnutrition Heran is at increased risk for malnutrition due to vitamin D, vitamin B12, and folate deficiencies.  Assessment/Plan:   Meds ordered this encounter  Medications   Vitamin D, Ergocalciferol, (DRISDOL) 1.25 MG (50000 UNIT) CAPS capsule    Sig: Take 1 capsule (50,000 Units total) by mouth every 7 (seven) days.    Dispense:  4 capsule    Refill:  0    One mo supply; OV for RF     1. Essential hypertension Valerie Morales is at goal and is working on healthy weight loss and exercise to improve blood pressure control. We will watch for signs of hypotension as she continues her lifestyle modifications.  2. Vitamin D deficiency Plan: - Discussed importance of vitamin D to their health and well-being.  - possible symptoms of low Vitamin D  can be low energy, depressed mood, muscle aches, joint aches, osteoporosis etc. - low Vitamin D levels may be linked to an increased risk of cardiovascular events and even increased risk of cancers- such as colon and breast.  - I recommend pt take a once weekly prescription vit D - see script below   - Informed patient this may be a lifelong thing, and she was encouraged to continue to take the medicine until told otherwise.   - we will need to monitor levels regularly (every 3-4 mo on average)  to keep levels within normal limits.  - weight loss will likely improve availability of vitamin D, thus encouraged Jewell to continue with meal plan and their weight loss efforts to further improve this condition - pt's questions and concerns regarding this condition addressed.   - Vitamin D, Ergocalciferol, (DRISDOL) 1.25 MG (50000 UNIT) CAPS capsule; Take 1 capsule (50,000 Units total) by mouth every 7 (seven) days.  Dispense: 4 capsule; Refill: 0  3. Folate deficiency Valerie Morales agrees to double her OTC supplement and we will recheck in 3 to 4 months.  4. B12 deficiency Valerie Morales agrees to follow up with her PCP for B12 therapy and further care to be provided by her PCP.  5. H/O gastric bypass I discussed with Evanny that she is at higher risk for nutritional and vitamin deficiencies. Counseling was done and all questions were answered.  6. ETOH abuse Marisol has an appointment with her counselor this Thursday at [place] to help to cut back on ETOH. Counseling was done.  7. At risk for malnutrition Valerie Morales was given extensive malnutrition prevention education and counseling today of more than 23 minutes.  Counseled her that malnutrition refers to inappropriate nutrients or not the right balance of nutrients for optimal health.  Discussed with Valerie Morales that it is absolutely possible to be malnourished but yet obese.  Risk factors, including but not limited to, inappropriate dietary choices, difficulty with obtaining food due to physical or financial limitations, and various physical and mental health conditions were reviewed with Valerie Morales.    8. Obesity with current BMI of 37.5 Rhanda is currently in the action stage of change. As such, her goal is to continue with weight loss efforts. She has agreed to the Category 2 Plan, but with 6 ounces of protein at lunch.  Exercise goals:  As is.  Behavioral modification strategies: increasing lean protein  intake, decreasing liquid calories, meal planning and cooking strategies, and planning for success.  Valerie Morales has agreed to follow-up with our clinic in 2 weeks. She was informed of the importance of frequent follow-up visits to maximize her success with intensive lifestyle modifications for her multiple health conditions.   Objective:   Blood pressure 123/83, pulse 96, temperature 97.6 F (36.4 C), height '5\' 3"'$  (1.6 m), weight 211 lb (95.7 kg), SpO2 100 %. Body mass index is 37.38 kg/m.  General: Cooperative, alert, well developed, in no acute distress. HEENT: Conjunctivae and lids unremarkable. Cardiovascular: Regular rhythm.  Lungs: Normal work of breathing. Neurologic: No focal deficits.   Lab Results  Component Value Date   CREATININE 0.77 03/26/2021   BUN 10 03/26/2021   NA 137 03/26/2021   K 3.5 03/26/2021   CL 99 03/26/2021   CO2 28 03/26/2021   Lab Results  Component Value Date   ALT 49 (H) 03/26/2021   AST 67 (H) 03/26/2021   ALKPHOS 92 03/26/2021   BILITOT 0.3 03/26/2021   Lab Results  Component Value Date   HGBA1C 5.5 03/05/2021   HGBA1C 5.5 07/23/2020   HGBA1C 5.6 02/07/2020   HGBA1C 5.8 07/25/2019   HGBA1C  11/11/2008    5.1 (NOTE)   The ADA recommends the following therapeutic goal for glycemic   control related to Hgb A1C measurement:   Goal of Therapy:   < 7.0% Hgb A1C   Reference: American Diabetes Association: Clinical Practice   Recommendations 2008, Diabetes Care,  2008, 31:(Suppl 1).   No results found for: INSULIN Lab Results  Component Value Date   TSH 2.220 05/05/2021   Lab Results  Component Value Date   CHOL 210 (H) 03/05/2021   HDL 79.60 03/05/2021   LDLCALC 97 03/05/2021   TRIG 169.0 (H) 03/05/2021   CHOLHDL 3 03/05/2021   Lab Results  Component Value Date   VD25OH 30.55 03/05/2021   VD25OH 35.97 07/25/2019   VD25OH 7.9 (L) 12/16/2017   Lab Results  Component Value Date   WBC 8.1 01/22/2021   HGB 11.9 (L) 01/22/2021    HCT 33.5 (L) 01/22/2021   MCV 94.4 01/22/2021   PLT 235 01/22/2021   Lab Results  Component Value Date   IRON 71 12/16/2017   TIBC 430 12/16/2017   FERRITIN 23 12/16/2017   Attestation Statements:   Reviewed by clinician on day of visit: allergies, medications, problem list, medical history, surgical history, family history, social history, and previous encounter notes.  IMarcille Blanco, CMA, am acting as transcriptionist for [employer], DO   I have reviewed the above documentation for accuracy and completeness, and I agree with the above. Marjory Sneddon, D.O.  The [place] was signed into law in 2016 which includes the topic of electronic health records.  This provides immediate access to information in MyChart.  This includes consultation notes, operative notes, office notes, lab results and pathology reports.  If you have any questions about what you read please let us know at your next visit so we can discuss your concerns and take corrective action if need be.  We are right here with you.

## 2021-06-09 ENCOUNTER — Ambulatory Visit (INDEPENDENT_AMBULATORY_CARE_PROVIDER_SITE_OTHER): Payer: 59 | Admitting: Family Medicine

## 2021-06-09 ENCOUNTER — Other Ambulatory Visit: Payer: Self-pay

## 2021-06-09 ENCOUNTER — Encounter (INDEPENDENT_AMBULATORY_CARE_PROVIDER_SITE_OTHER): Payer: Self-pay | Admitting: Family Medicine

## 2021-06-09 VITALS — BP 126/86 | HR 114 | Temp 97.7°F | Ht 63.0 in | Wt 214.0 lb

## 2021-06-09 DIAGNOSIS — Z9189 Other specified personal risk factors, not elsewhere classified: Secondary | ICD-10-CM

## 2021-06-09 DIAGNOSIS — Z6837 Body mass index (BMI) 37.0-37.9, adult: Secondary | ICD-10-CM | POA: Diagnosis not present

## 2021-06-09 DIAGNOSIS — E559 Vitamin D deficiency, unspecified: Secondary | ICD-10-CM | POA: Diagnosis not present

## 2021-06-09 MED ORDER — VITAMIN D (ERGOCALCIFEROL) 1.25 MG (50000 UNIT) PO CAPS: 50000.0000 [IU] | ORAL_CAPSULE | ORAL | 0 refills | Status: DC

## 2021-06-10 NOTE — Progress Notes (Signed)
Chief Complaint:   OBESITY Valerie Morales is here to discuss her progress with her obesity treatment plan along with follow-up of her obesity related diagnoses. Valerie Morales is on the Category 2 Plan with 6 oz at lunch and states she is following her eating plan approximately 80% of the time. Valerie Morales states she is not currently exercising.  Today's visit was #: 3 Starting weight: 213 lbs Starting date: 05/05/2021 Today's weight: 214 lbs Today's date: 06/09/2021 Total lbs lost to date: 0 Total lbs lost since last in-office visit: +3  Interim History: Valerie Morales skips dinner about 2-3 days a week but eats breakfast and lunch on plan! She has gained over 7 lbs in water weight since her last OV. Her water intake is at 3-4 maximum bottles per day now. She eats salty foods.  Assessment/Plan:  No orders of the defined types were placed in this encounter.   Medications Discontinued During This Encounter  Medication Reason   Vitamin D, Ergocalciferol, (DRISDOL) 1.25 MG (50000 UNIT) CAPS capsule Reorder     Meds ordered this encounter  Medications   Vitamin D, Ergocalciferol, (DRISDOL) 1.25 MG (50000 UNIT) CAPS capsule    Sig: Take 1 capsule (50,000 Units total) by mouth every 7 (seven) days.    Dispense:  4 capsule    Refill:  0    One mo supply; OV for RF     1. Vitamin D deficiency Not at goal.  Valerie Morales was started on prescription Vit D at last OV and is tolerating it well with no side effects.  Good compliance.  Plan: Continue to take prescription Vitamin D '@50'$ ,000 IU every week as prescribed.  Follow-up for routine testing of Vitamin D, at least 2-3 times per year to avoid over-replacement.  Lab Results  Component Value Date   VD25OH 30.55 03/05/2021   VD25OH 35.97 07/25/2019   VD25OH 7.9 (L) 12/16/2017   Refill- Vitamin D, Ergocalciferol, (DRISDOL) 1.25 MG (50000 UNIT) CAPS capsule; Take 1 capsule (50,000 Units total) by mouth every 7 (seven) days.  Dispense: 4 capsule;  Refill: 0  2. At risk for dehydration Valerie Morales is at higher than average risk of dehydration due to inadequate water intake.  Valerie Morales was given more than 9 minutes of proper hydration counseling today.  We discussed the signs and symptoms of dehydration, some of which may include muscle cramping, constipation or even orthostatic symptoms.  Counseling on the prevention of dehydration was also provided today.  Valerie Morales is at risk for dehydration due to weight loss, lifestyle and behavorial habits and possibly due to taking certain medication(s).  She was encouraged to adequately hydrate and monitor fluid status to avoid dehydration as well as weight loss plateaus.  Unless pre-existing renal or cardiopulmonary conditions exist, in which patient was told to limit their fluid intake, I recommended roughly one half of their weight in pounds to be the approximate ounces of non-caloric, non-caffeinated beverages they should drink per day; including more if they are engaging in exercise.  3. Obesity with current BMI of 38.0  Valerie Morales is currently in the action stage of change. As such, her goal is to continue with weight loss efforts. She has agreed to the Category 2 Plan with 6 oz protein at lunch.   Shoot for 6 bottles of water per day.  Exercise goals:  As is  Behavioral modification strategies: increasing lean protein intake, decreasing simple carbohydrates, and planning for success.  Valerie Morales has agreed to follow-up with our clinic in 2-3 weeks.  She was informed of the importance of frequent follow-up visits to maximize her success with intensive lifestyle modifications for her multiple health conditions.   Objective:   Blood pressure 126/86, pulse (!) 114, temperature 97.7 F (36.5 C), height '5\' 3"'$  (1.6 m), weight 214 lb (97.1 kg), SpO2 97 %. Body mass index is 37.91 kg/m.  General: Cooperative, alert, well developed, in no acute distress. HEENT: Conjunctivae and lids  unremarkable. Cardiovascular: Regular rhythm.  Lungs: Normal work of breathing. Neurologic: No focal deficits.   Lab Results  Component Value Date   CREATININE 0.77 03/26/2021   BUN 10 03/26/2021   NA 137 03/26/2021   K 3.5 03/26/2021   CL 99 03/26/2021   CO2 28 03/26/2021   Lab Results  Component Value Date   ALT 49 (H) 03/26/2021   AST 67 (H) 03/26/2021   ALKPHOS 92 03/26/2021   BILITOT 0.3 03/26/2021   Lab Results  Component Value Date   HGBA1C 5.5 03/05/2021   HGBA1C 5.5 07/23/2020   HGBA1C 5.6 02/07/2020   HGBA1C 5.8 07/25/2019   HGBA1C  11/11/2008    5.1 (NOTE)   The ADA recommends the following therapeutic goal for glycemic   control related to Hgb A1C measurement:   Goal of Therapy:   < 7.0% Hgb A1C   Reference: American Diabetes Association: Clinical Practice   Recommendations 2008, Diabetes Care,  2008, 31:(Suppl 1).   No results found for: INSULIN Lab Results  Component Value Date   TSH 2.220 05/05/2021   Lab Results  Component Value Date   CHOL 210 (H) 03/05/2021   HDL 79.60 03/05/2021   LDLCALC 97 03/05/2021   TRIG 169.0 (H) 03/05/2021   CHOLHDL 3 03/05/2021   Lab Results  Component Value Date   VD25OH 30.55 03/05/2021   VD25OH 35.97 07/25/2019   VD25OH 7.9 (L) 12/16/2017   Lab Results  Component Value Date   WBC 8.1 01/22/2021   HGB 11.9 (L) 01/22/2021   HCT 33.5 (L) 01/22/2021   MCV 94.4 01/22/2021   PLT 235 01/22/2021   Lab Results  Component Value Date   IRON 71 12/16/2017   TIBC 430 12/16/2017   FERRITIN 23 12/16/2017   Attestation Statements:   Reviewed by clinician on day of visit: allergies, medications, problem list, medical history, surgical history, family history, social history, and previous encounter notes.  Coral Ceo, CMA, am acting as transcriptionist for [employer], DO.  I have reviewed the above documentation for accuracy and completeness, and I agree with the above. Marjory Sneddon, D.O.  The  [place] was signed into law in 2016 which includes the topic of electronic health records.  This provides immediate access to information in MyChart.  This includes consultation notes, operative notes, office notes, lab results and pathology reports.  If you have any questions about what you read please let us know at your next visit so we can discuss your concerns and take corrective action if need be.  We are right here with you.

## 2021-06-23 ENCOUNTER — Encounter (INDEPENDENT_AMBULATORY_CARE_PROVIDER_SITE_OTHER): Payer: Self-pay | Admitting: Physician Assistant

## 2021-06-23 ENCOUNTER — Other Ambulatory Visit: Payer: Self-pay

## 2021-06-23 ENCOUNTER — Ambulatory Visit (INDEPENDENT_AMBULATORY_CARE_PROVIDER_SITE_OTHER): Payer: 59 | Admitting: Physician Assistant

## 2021-06-23 VITALS — BP 135/91 | HR 97 | Temp 97.7°F | Ht 63.0 in | Wt 217.0 lb

## 2021-06-23 DIAGNOSIS — Z6837 Body mass index (BMI) 37.0-37.9, adult: Secondary | ICD-10-CM | POA: Diagnosis not present

## 2021-06-23 DIAGNOSIS — E559 Vitamin D deficiency, unspecified: Secondary | ICD-10-CM | POA: Diagnosis not present

## 2021-06-23 NOTE — Progress Notes (Signed)
Chief Complaint:   OBESITY Valerie Morales is here to discuss her progress with her obesity treatment plan along with follow-up of her obesity related diagnoses. Valerie Morales is on the Category 2 Plan + 6 oz protein at lunch and states she is following her eating plan approximately 50% of the time. Valerie Morales states she is not currently exercising.  Today's visit was #: 4 Starting weight: 213 lbs Starting date: 05/05/2021 Today's weight: 217 lbs Today's date: 06/23/2021 Total lbs lost to date: 0 Total lbs lost since last in-office visit: 0  Interim History: Valerie Morales reports having an emotional couple of weeks and doing some comfort eating. She states that she "rebelled" from the plan. She continues to skip meals throughout the week.  Subjective:   1. Vitamin D deficiency Pt denies nausea, vomiting, and muscle weakness. She is on prescription Vit D.  Assessment/Plan:   1. Vitamin D deficiency Low Vitamin D level contributes to fatigue and are associated with obesity, breast, and colon cancer. She agrees to continue to take prescription Vitamin D 50,000 IU every week and will follow-up for routine testing of Vitamin D, at least 2-3 times per year to avoid over-replacement.  Refill Vit D 50,000 IU weekly, Disp #4, RF 0 (no refill ordered)   2. Obesity with current BMI of 38.45  Valerie Morales is currently in the action stage of change. As such, her goal is to continue with weight loss efforts. She has agreed to the Category 2 Plan with 100 extra snack calories.   Exercise goals:  As is  Behavioral modification strategies: no skipping meals and meal planning and cooking strategies.  Valerie Morales has agreed to follow-up with our clinic in 2 weeks. She was informed of the importance of frequent follow-up visits to maximize her success with intensive lifestyle modifications for her multiple health conditions.   Objective:   Blood pressure (!) 135/91, pulse 97, temperature 97.7 F (36.5 C),  height '5\' 3"'$  (1.6 m), weight 217 lb (98.4 kg), SpO2 98 %. Body mass index is 38.44 kg/m.  General: Cooperative, alert, well developed, in no acute distress. HEENT: Conjunctivae and lids unremarkable. Cardiovascular: Regular rhythm.  Lungs: Normal work of breathing. Neurologic: No focal deficits.   Lab Results  Component Value Date   CREATININE 0.77 03/26/2021   BUN 10 03/26/2021   NA 137 03/26/2021   K 3.5 03/26/2021   CL 99 03/26/2021   CO2 28 03/26/2021   Lab Results  Component Value Date   ALT 49 (H) 03/26/2021   AST 67 (H) 03/26/2021   ALKPHOS 92 03/26/2021   BILITOT 0.3 03/26/2021   Lab Results  Component Value Date   HGBA1C 5.5 03/05/2021   HGBA1C 5.5 07/23/2020   HGBA1C 5.6 02/07/2020   HGBA1C 5.8 07/25/2019   HGBA1C  11/11/2008    5.1 (NOTE)   The ADA recommends the following therapeutic goal for glycemic   control related to Hgb A1C measurement:   Goal of Therapy:   < 7.0% Hgb A1C   Reference: American Diabetes Association: Clinical Practice   Recommendations 2008, Diabetes Care,  2008, 31:(Suppl 1).   No results found for: INSULIN Lab Results  Component Value Date   TSH 2.220 05/05/2021   Lab Results  Component Value Date   CHOL 210 (H) 03/05/2021   HDL 79.60 03/05/2021   LDLCALC 97 03/05/2021   TRIG 169.0 (H) 03/05/2021   CHOLHDL 3 03/05/2021   Lab Results  Component Value Date   VD25OH 30.55 03/05/2021  VD25OH 35.97 07/25/2019   VD25OH 7.9 (L) 12/16/2017   Lab Results  Component Value Date   WBC 8.1 01/22/2021   HGB 11.9 (L) 01/22/2021   HCT 33.5 (L) 01/22/2021   MCV 94.4 01/22/2021   PLT 235 01/22/2021   Lab Results  Component Value Date   IRON 71 12/16/2017   TIBC 430 12/16/2017   FERRITIN 23 12/16/2017   Attestation Statements:   Reviewed by clinician on day of visit: allergies, medications, problem list, medical history, surgical history, family history, social history, and previous encounter notes.  Time spent on visit  including pre-visit chart review and post-visit care and charting was 30 minutes.   Coral Ceo, CMA, am acting as transcriptionist for [employer], PA-C.  I have reviewed the above documentation for accuracy and completeness, and I agree with the above. Abby Potash, PA-C

## 2021-06-28 ENCOUNTER — Other Ambulatory Visit: Payer: Self-pay | Admitting: Internal Medicine

## 2021-06-28 DIAGNOSIS — F419 Anxiety disorder, unspecified: Secondary | ICD-10-CM

## 2021-06-28 DIAGNOSIS — F32A Depression, unspecified: Secondary | ICD-10-CM

## 2021-07-07 ENCOUNTER — Ambulatory Visit (INDEPENDENT_AMBULATORY_CARE_PROVIDER_SITE_OTHER): Payer: 59 | Admitting: Family Medicine

## 2021-07-07 ENCOUNTER — Encounter (INDEPENDENT_AMBULATORY_CARE_PROVIDER_SITE_OTHER): Payer: Self-pay

## 2021-07-07 DIAGNOSIS — Z5329 Procedure and treatment not carried out because of patient's decision for other reasons: Secondary | ICD-10-CM

## 2021-08-02 ENCOUNTER — Emergency Department
Admission: EM | Admit: 2021-08-02 | Discharge: 2021-08-02 | Disposition: A | Discharge status: Home / Self Care | Payer: 59 | Attending: Emergency Medicine | Admitting: Emergency Medicine

## 2021-08-02 ENCOUNTER — Other Ambulatory Visit: Payer: Self-pay

## 2021-08-02 DIAGNOSIS — T7840XA Allergy, unspecified, initial encounter: Secondary | ICD-10-CM

## 2021-08-02 DIAGNOSIS — T63441A Toxic effect of venom of bees, accidental (unintentional), initial encounter: Secondary | ICD-10-CM | POA: Insufficient documentation

## 2021-08-02 MED ORDER — EPINEPHRINE 0.3 MG/0.3ML IJ SOAJ: 0.3000 mg | INTRAMUSCULAR | 1 refills | Status: AC | PRN

## 2021-08-02 MED ORDER — FAMOTIDINE IN NACL 20-0.9 MG/50ML-% IV SOLN
20.0000 mg | Freq: Once | INTRAVENOUS | Status: AC
  Administered 2021-08-02: 20 mg via INTRAVENOUS
  Filled 2021-08-02: qty 50

## 2021-08-02 MED ORDER — NAPROXEN 500 MG PO TABS
500.0000 mg | ORAL_TABLET | Freq: Once | ORAL | Status: AC
  Administered 2021-08-02: 500 mg via ORAL
  Filled 2021-08-02: qty 1

## 2021-08-02 MED ORDER — EPINEPHRINE 0.3 MG/0.3ML IJ SOAJ: 0.3000 mg | INTRAMUSCULAR | 1 refills | Status: DC | PRN

## 2021-08-02 MED ORDER — DIPHENHYDRAMINE HCL 50 MG/ML IJ SOLN
25.0000 mg | Freq: Once | INTRAMUSCULAR | Status: AC
  Administered 2021-08-02: 25 mg via INTRAVENOUS
  Filled 2021-08-02: qty 1

## 2021-08-02 MED ORDER — ACETAMINOPHEN 500 MG PO TABS
1000.0000 mg | ORAL_TABLET | Freq: Once | ORAL | Status: AC
  Administered 2021-08-02: 1000 mg via ORAL
  Filled 2021-08-02: qty 2

## 2021-08-02 MED ORDER — METHYLPREDNISOLONE SODIUM SUCC 125 MG IJ SOLR
125.0000 mg | Freq: Once | INTRAMUSCULAR | Status: AC
  Administered 2021-08-02: 125 mg via INTRAVENOUS
  Filled 2021-08-02: qty 2

## 2021-08-02 NOTE — ED Triage Notes (Signed)
Pt comes pov with bee sting to left index finger. Hx of major food and bee allergies so pt already used epi pen. Able to breathe, talk at this time. Used epi pen about 15 mins ago.

## 2021-08-02 NOTE — Discharge Instructions (Addendum)
Please take Tylenol and ibuprofen/Advil for your pain.  It is safe to take them together, or to alternate them every few hours.  Take up to 1000mg  of Tylenol at a time, up to 4 times per day.  Do not take more than 4000 mg of Tylenol in 24 hours.  For ibuprofen, take 400-600 mg, 4-5 times per day.  Use Benadryl for itchiness and skin irritation from the bee sting.   A prescription for another EpiPen was sent to the pharmacy for you.

## 2021-08-02 NOTE — ED Provider Notes (Signed)
Vitals:   08/02/21 1500 08/02/21 1530  BP: 132/86 131/76  Pulse: (!) 109 (!) 109  Resp:    Temp:    SpO2: 99% 99%   Patient is resting comfortably, no distress.  Denies any shortness of breath no wheezing no rash no swelling.  Fully awake alert.  Will provide epinephrine prescription, she believes she may have an extra epinephrine pen at home but not sure.  Return precautions and treatment recommendations and follow-up discussed with the patient who is agreeable with the plan.    Delman Kitten, MD 08/02/21 1537

## 2021-08-02 NOTE — ED Provider Notes (Signed)
[facility] Emergency Department Provider Note ____________________________________________   Event Date/Time   First MD Initiated Contact with Patient 08/02/21 1405     (approximate)  I have reviewed the triage vital signs and the nursing notes.  HISTORY  Chief Complaint Allergic Reaction   HPI Valerie Morales is a 45 y.o. femalewho presents to the ED for evaluation of allergic reaction.  Chart review indicates obese patient  Patient presents to the ED for evaluation for allergic reaction after providing herself an EpiPen just prior to arrival.  She reports a bee sting to her left second finger, the volar aspect of this, around noon today.  Shortly after this she developed increasing pain locally to the area, sensation of feeling hot in a generalized fashion, so she provided herself an EpiPen.  This is the first time she is ever given herself an EpiPen.  Denies any throat closure sensation, shortness of breath, nausea, abdominal pain, emesis, syncope, falls or trauma.  Reports no other trauma beyond the single bee sting to the volar aspect of her left second finger.  Reports feeling much better now, just has some localized pain to the area.  No other sites of pruritus, rash or skin changes noted   Past Medical History:  Diagnosis Date   Anemia    has required 2 units PRBC   Anxiety    B12 deficiency    Back pain    Bipolar depression (HCC)    Chest pain    Chicken pox    Depression    trazadone has not helped in the past    Fatty liver    Headache    High cholesterol    History of blood transfusion 2009   [place]   Hypertension    MRSA (methicillin resistant Staphylococcus aureus)    Shortness of breath on exertion    Sinusitis    Sinusitis, chronic    Sleep apnea    UTI (urinary tract infection)    Vitamin D deficiency     Patient Active Problem List   Diagnosis Date Noted   Alcohol abuse 05/05/2021   B12 deficiency 03/20/2021    Hyperlipidemia 03/19/2021   Scoliosis of thoracic spine 03/19/2021   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   FH: heart disease 03/05/2021   Atypical chest pain 03/05/2021   Hepatomegaly 03/05/2021   OSA (obstructive sleep apnea) 02/02/2021   Sinus tachycardia 05/28/2020   S/P foot surgery, left 05/28/2020   Fatty liver 02/07/2020   Morbid obesity with BMI of 40.0-44.9, adult ([place]) 01/24/2020   Sinus tarsi syndrome of left ankle 01/24/2020   Prediabetes 07/25/2019   Elevated liver enzymes 07/25/2019   Allergic rhinitis 08/11/2018   Chronic sinusitis 08/11/2018   Vitamin D deficiency 04/11/2018   Abnormal mammogram of left breast 04/11/2018   Plantar fasciitis 01/09/2018   Essential hypertension 12/18/2017   Anxiety and depression 12/18/2017   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   Anemia 12/15/2017   Pelvic pain 12/15/2017   Breast pain 12/15/2017   Chronic pain of left ankle 12/15/2017   Morbid obesity ([place]) 12/15/2017   Annual physical exam 01/02/2014   Vaginal discharge 01/02/2014   Fatigue 12/21/2013   Hot flashes 12/21/2013   Sleep disturbance 12/21/2013    Past Surgical History:  Procedure Laterality Date   ANKLE ARTHROSCOPY Left 05/09/2020   Procedure: ANKLE ARTHROSCOPY OCD REPAIR LEFT;  Surgeon: Samara Deist, DPM;  Location: [facility] ORS;  Service: Podiatry;  Laterality: Left;   CESAREAN  SECTION     x3 [place]   COLONOSCOPY WITH PROPOFOL N/A 05/01/2019   Procedure: COLONOSCOPY WITH PROPOFOL;  Surgeon: Lucilla Lame, MD;  Location: [facility] ENDOSCOPY;  Service: Endoscopy;  Laterality: N/A;   ESOPHAGOGASTRODUODENOSCOPY (EGD) WITH PROPOFOL N/A 05/01/2019   Procedure: ESOPHAGOGASTRODUODENOSCOPY (EGD) WITH PROPOFOL;  Surgeon: Lucilla Lame, MD;  Location: [facility] ENDOSCOPY;  Service: Endoscopy;  Laterality: N/A;   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 in [place]     2009/2010   OSTECTOMY Left 05/09/2020   Procedure:  EVANS/MCDO LEFT;  Surgeon: Samara Deist, DPM;  Location: [facility] ORS;  Service: Podiatry;  Laterality: Left;   TENDON TRANSFER Left 05/09/2020   Procedure: FDL TRANSFER;DEEP LEFT;  Surgeon: Samara Deist, DPM;  Location: [facility] ORS;  Service: Podiatry;  Laterality: Left;   TUBAL LIGATION     uterine ablation  2010   WOUND DEBRIDEMENT Left 05/09/2020   Procedure: A-SCOPE/DEBRIDEMENT/EXTENSIVE LEFT;  Surgeon: Samara Deist, DPM;  Location: [facility] ORS;  Service: Podiatry;  Laterality: Left;    Prior to Admission medications   Medication Sig Start Date End Date Taking? Authorizing Provider  amLODipine (NORVASC) 5 MG tablet Take 1 tablet (5 mg total) by mouth in the morning and at bedtime. Take 2nd dose if BP >130/>80 in the pm 03/05/21   McLean-Scocuzza, Nino Glow, MD  cetirizine (ZYRTEC) 10 MG tablet Take 10 mg by mouth daily.    [provider]  cyanocobalamin (,VITAMIN B-12,) 1000 MCG/ML injection inject B12 1x per week x 1 month then every 30 days 03/20/21   McLean-Scocuzza, Nino Glow, MD  diphenhydrAMINE (BENADRYL) 25 MG tablet Take 25 mg by mouth daily as needed for allergies.     [provider]  etodolac (LODINE) 400 MG tablet Take by mouth. 01/17/20 08/28/21  [provider]  famotidine (PEPCID) 20 MG tablet Take 1 tablet (20 mg total) by mouth 2 (two) times daily. 01/22/21 01/22/22  Naaman Plummer, MD  LORazepam (ATIVAN) 0.5 MG tablet Take 1 tablet (0.5 mg total) by mouth daily as needed for anxiety. 11/20/20   McLean-Scocuzza, Nino Glow, MD  olmesartan-hydrochlorothiazide (BENICAR HCT) 20-12.5 MG tablet Take 1 tablet by mouth daily. D/c 40-25 in am 03/05/21   McLean-Scocuzza, Nino Glow, MD  ondansetron (ZOFRAN) 4 MG tablet Take 1 tablet (4 mg total) by mouth every 8 (eight) hours as needed for nausea or vomiting. 05/07/21   McLean-Scocuzza, Nino Glow, MD  sucralfate (CARAFATE) 1 GM/10ML suspension Take 10 mLs (1 g total) by mouth 4 (four) times daily. 01/22/21 01/22/22  Naaman Plummer, MD   tiZANidine (ZANAFLEX) 4 MG capsule Take 1 capsule (4 mg total) by mouth at bedtime as needed for muscle spasms. 11/20/20   McLean-Scocuzza, Nino Glow, MD  venlafaxine XR (EFFEXOR-XR) 150 MG 24 hr capsule TAKE 1 CAPSULE BY MOUTH ONCE DAILY WITH BREAKFAST 06/29/21   McLean-Scocuzza, Nino Glow, MD  Vitamin D, Ergocalciferol, (DRISDOL) 1.25 MG (50000 UNIT) CAPS capsule Take 1 capsule (50,000 Units total) by mouth every 7 (seven) days. 06/09/21   Mellody Dance, DO    Allergies Other, Penicillins, Percocet [oxycodone-acetaminophen], Ambien [zolpidem], Lipitor [atorvastatin], Molds & smuts, Aspirin, and Latex  Family History  Problem Relation Age of Onset   Obesity Mother    Sleep apnea Mother    Thyroid disease Mother    Hyperlipidemia Mother    Cancer Mother 60       lung  cancer-small cell lung cancer    Heart disease Mother        MI with stents   Hypertension Mother    Diabetes Mother    COPD Mother    Hypertension Father    Hyperlipidemia Father    Cancer Maternal Grandmother        brain cancer    Alcohol abuse Maternal Grandfather    Heart disease Maternal Grandfather    Arthritis Paternal Grandmother    Cancer Paternal Grandmother        leukemia    Hypertension Paternal Grandmother    Breast cancer Paternal Grandmother    Heart disease Paternal Grandfather    Hypertension Paternal Grandfather    Mental illness Son    Cancer Maternal Aunt        breast cancer   Breast cancer Maternal Aunt    Alcohol abuse Maternal Aunt        Liver Failure   Cancer Maternal Uncle        colon cancer    Social History Social History   Tobacco Use   Smoking status: Never   Smokeless tobacco: Never  Vaping Use   Vaping Use: Never used  Substance Use Topics   Alcohol use: Yes    Comment: 2 DRINK WEEKLY   Drug use: Not Currently    Types: Marijuana    Comment: 17 YEARS AGO    Review of Systems  Constitutional: No fever/chills Eyes: No visual changes. ENT: No sore  throat. Cardiovascular: Denies chest pain. Respiratory: Denies shortness of breath. Gastrointestinal: No abdominal pain.  No nausea, no vomiting.  No diarrhea.  No constipation. Genitourinary: Negative for dysuria. Musculoskeletal: Negative for back pain. Skin: Positive for bee sting to the left second finger causing erythema and pain Neurological: Negative for headaches, focal weakness or numbness.  ____________________________________________   PHYSICAL EXAM:  VITAL SIGNS: Vitals:   08/02/21 1432 08/02/21 1433  BP: 131/89   Pulse: (!) 108 (!) 108  Resp: 16   Temp:    SpO2: 96% 96%     Constitutional: Alert and oriented. Well appearing and in no acute distress.  Obese Eyes: Conjunctivae are normal. PERRL. EOMI. Head: Atraumatic. Nose: No congestion/rhinnorhea. Mouth/Throat: Mucous membranes are moist.  Oropharynx non-erythematous. Neck: No stridor. No cervical spine tenderness to palpation. Cardiovascular: Tachycardic rate, regular rhythm. Grossly normal heart sounds.  Good peripheral circulation. Respiratory: Normal respiratory effort.  No retractions. Lungs CTAB. Gastrointestinal: Soft , nondistended, nontender to palpation. No CVA tenderness. Musculoskeletal: No lower extremity tenderness nor edema.  No joint effusions.  Freely moving her left second finger as she shows me the bee sting without evidence of [place] signs Flat and confluent erythema to the volar aspect of the proximal phalanx of the left second finger. No signs of laceration or open injury. No signs of trauma to the left hand, fingers, wrist or throughout the remaining 4 extremities. Neurologic:  Normal speech and language. No gross focal neurologic deficits are appreciated. No gait instability noted. Skin:  Skin is warm, dry and intact. No rash noted. Psychiatric: Mood and affect are normal. Speech and behavior are normal.  ____________________________________________   LABS (all labs ordered are  listed, but only abnormal results are displayed)  Labs Reviewed - No data to display ____________________________________________  12 Lead EKG   ____________________________________________  RADIOLOGY  ED MD interpretation:    Official radiology report(s): No results found.  ____________________________________________   PROCEDURES and INTERVENTIONS  Procedure(s) performed (including Critical Care):  [name] Kitchen  1-3 Lead EKG Interpretation Performed by: Vladimir Crofts, MD Authorized by: Vladimir Crofts, MD     Interpretation: abnormal     ECG rate:  102   ECG rate assessment: tachycardic     Rhythm: sinus tachycardia     Ectopy: none     Conduction: normal   .Critical Care Performed by: Vladimir Crofts, MD Authorized by: Vladimir Crofts, MD   Critical care provider statement:    Critical care time (minutes):  30   Critical care was necessary to treat or prevent imminent or life-threatening deterioration of the following conditions:  Toxidrome and metabolic crisis  Medications  methylPREDNISolone sodium succinate (SOLU-MEDROL) 125 mg/2 mL injection 125 mg (125 mg Intravenous Given 08/02/21 1246)  diphenhydrAMINE (BENADRYL) injection 25 mg (25 mg Intravenous Given 08/02/21 1245)  famotidine (PEPCID) IVPB 20 mg premix (0 mg Intravenous Stopped 08/02/21 1431)  acetaminophen (TYLENOL) tablet 1,000 mg (1,000 mg Oral Given 08/02/21 1430)  naproxen (NAPROSYN) tablet 500 mg (500 mg Oral Given 08/02/21 1430)    ____________________________________________   MDM / ED COURSE   45 year old female presents to the ED for evaluation of allergic reaction requiring EpiPen and observation.  She is tachycardic but hemodynamically stable.  No signs of residual anaphylaxis after providing EpiPen.  No wheezing, hives or signs of upper airway obstruction.  Evidence of a singular bee sting is noted to the left second finger, but no evidence of flexor tenosynovitis or superimposed infection.  We will  supplement her EpiPen with steroids, Benadryl and provide nonnarcotic analgesia for her finger.  We will observe for at least 3 hours to ensure no need for repeat epinephrine.  Patient signed out to oncoming provider to follow-up on this reassessment with anticipation of discharge home and outpatient management if this is uncomplicated.     ____________________________________________   FINAL CLINICAL IMPRESSION(S) / ED DIAGNOSES  Final diagnoses:  Bee sting, accidental or unintentional, initial encounter  Allergic reaction, initial encounter     ED Discharge Orders     None        Ameri Cahoon Tamala Julian   Note:  This document was prepared using Dragon voice recognition software and may include unintentional dictation errors.    Vladimir Crofts, MD 08/02/21 [phone]

## 2021-08-16 ENCOUNTER — Other Ambulatory Visit: Payer: Self-pay | Admitting: Internal Medicine

## 2021-08-16 DIAGNOSIS — F419 Anxiety disorder, unspecified: Secondary | ICD-10-CM

## 2021-08-16 DIAGNOSIS — F32A Depression, unspecified: Secondary | ICD-10-CM

## 2021-09-05 ENCOUNTER — Other Ambulatory Visit: Payer: Self-pay | Admitting: Internal Medicine

## 2021-09-05 DIAGNOSIS — I10 Essential (primary) hypertension: Secondary | ICD-10-CM

## 2021-09-09 ENCOUNTER — Encounter: Payer: Self-pay | Admitting: Internal Medicine

## 2021-09-09 ENCOUNTER — Ambulatory Visit: Payer: 59 | Admitting: Internal Medicine

## 2021-09-09 ENCOUNTER — Other Ambulatory Visit: Payer: Self-pay

## 2021-09-09 VITALS — BP 124/82 | HR 111 | Temp 96.9°F | Ht 63.0 in | Wt 219.6 lb

## 2021-09-09 DIAGNOSIS — R000 Tachycardia, unspecified: Secondary | ICD-10-CM

## 2021-09-09 DIAGNOSIS — Z1389 Encounter for screening for other disorder: Secondary | ICD-10-CM

## 2021-09-09 DIAGNOSIS — L309 Dermatitis, unspecified: Secondary | ICD-10-CM

## 2021-09-09 DIAGNOSIS — E538 Deficiency of other specified B group vitamins: Secondary | ICD-10-CM

## 2021-09-09 DIAGNOSIS — Z0000 Encounter for general adult medical examination without abnormal findings: Secondary | ICD-10-CM | POA: Diagnosis not present

## 2021-09-09 DIAGNOSIS — R7989 Other specified abnormal findings of blood chemistry: Secondary | ICD-10-CM

## 2021-09-09 DIAGNOSIS — F419 Anxiety disorder, unspecified: Secondary | ICD-10-CM

## 2021-09-09 DIAGNOSIS — G8929 Other chronic pain: Secondary | ICD-10-CM

## 2021-09-09 DIAGNOSIS — E559 Vitamin D deficiency, unspecified: Secondary | ICD-10-CM

## 2021-09-09 DIAGNOSIS — F339 Major depressive disorder, recurrent, unspecified: Secondary | ICD-10-CM

## 2021-09-09 DIAGNOSIS — F32A Depression, unspecified: Secondary | ICD-10-CM

## 2021-09-09 DIAGNOSIS — I10 Essential (primary) hypertension: Secondary | ICD-10-CM

## 2021-09-09 DIAGNOSIS — M25571 Pain in right ankle and joints of right foot: Secondary | ICD-10-CM

## 2021-09-09 MED ORDER — AMLODIPINE BESYLATE 5 MG PO TABS: 5.0000 mg | ORAL_TABLET | Freq: Every day | ORAL | 3 refills | Status: DC

## 2021-09-09 MED ORDER — "SYRINGE/NEEDLE (DISP) 25G X 1"" 3 ML MISC": 1.0000 | 1 refills | Status: DC

## 2021-09-09 MED ORDER — CYANOCOBALAMIN 1000 MCG/ML IJ SOLN
1000.0000 ug | Freq: Once | INTRAMUSCULAR | Status: AC
  Administered 2021-09-09: 1000 ug via INTRAMUSCULAR

## 2021-09-09 MED ORDER — HYDROCORTISONE 2.5 % EX LOTN: TOPICAL_LOTION | Freq: Two times a day (BID) | CUTANEOUS | 2 refills | Status: DC

## 2021-09-09 MED ORDER — VENLAFAXINE HCL ER 150 MG PO CP24: 150.0000 mg | ORAL_CAPSULE | Freq: Every day | ORAL | 3 refills | Status: DC

## 2021-09-09 NOTE — Progress Notes (Signed)
Chief Complaint  Patient presents with   Annual Exam   Annual  1 htn on norvasc 5 mg qd and benicar 20-125 bp controlled 2. Anxiety/depression/ excess alcohol use with elevated lfts for now triggers home on effexor xr 150 mg qd tried [facility] therapy multiple times did not work  3. Rash to neck and cooked 1 week ago with pablano pepper and using black soap and BBW soaps which is not new  4. Ankle pain right pending tbd repeat surgery as bone graft is not fusing normally with podiatry just completed prednisone and mobic    Review of Systems  Constitutional:  Negative for weight loss.  HENT:  Negative for hearing loss.   Eyes:  Negative for blurred vision.  Respiratory:  Negative for shortness of breath.   Cardiovascular:  Negative for chest pain.  Gastrointestinal:  Negative for abdominal pain and blood in stool.  Genitourinary:  Negative for dysuria.  Musculoskeletal:  Negative for falls and joint pain.  Skin:  Negative for rash.  Neurological:  Negative for headaches.  Psychiatric/Behavioral:  Negative for depression.   Past Medical History:  Diagnosis Date   Anemia    has required 2 units PRBC   Anxiety    B12 deficiency    Back pain    Bipolar depression (HCC)    Chest pain    Chicken pox    Depression    trazadone has not helped in the past    Fatty liver    Headache    High cholesterol    History of blood transfusion 2009   [place]   Hypertension    MRSA (methicillin resistant Staphylococcus aureus)    Shortness of breath on exertion    Sinusitis    Sinusitis, chronic    Sleep apnea    UTI (urinary tract infection)    Vitamin D deficiency    Past Surgical History:  Procedure Laterality Date   ANKLE ARTHROSCOPY Left 05/09/2020   Procedure: ANKLE ARTHROSCOPY OCD REPAIR LEFT;  Surgeon: Samara Deist, DPM;  Location: [facility] ORS;  Service: Podiatry;  Laterality: Left;   CESAREAN SECTION     x3 [place]   COLONOSCOPY WITH  PROPOFOL N/A 05/01/2019   Procedure: COLONOSCOPY WITH PROPOFOL;  Surgeon: Lucilla Lame, MD;  Location: [facility] ENDOSCOPY;  Service: Endoscopy;  Laterality: N/A;   ESOPHAGOGASTRODUODENOSCOPY (EGD) WITH PROPOFOL N/A 05/01/2019   Procedure: ESOPHAGOGASTRODUODENOSCOPY (EGD) WITH PROPOFOL;  Surgeon: Lucilla Lame, MD;  Location: [facility] ENDOSCOPY;  Service: Endoscopy;  Laterality: N/A;   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 in [place]     2009/2010   OSTECTOMY Left 05/09/2020   Procedure: EVANS/MCDO LEFT;  Surgeon: Samara Deist, DPM;  Location: [facility] ORS;  Service: Podiatry;  Laterality: Left;   TENDON TRANSFER Left 05/09/2020   Procedure: FDL TRANSFER;DEEP LEFT;  Surgeon: Samara Deist, DPM;  Location: [facility] ORS;  Service: Podiatry;  Laterality: Left;   TUBAL LIGATION     uterine ablation  2010   WOUND DEBRIDEMENT Left 05/09/2020   Procedure: A-SCOPE/DEBRIDEMENT/EXTENSIVE LEFT;  Surgeon: Samara Deist, DPM;  Location: [facility] ORS;  Service: Podiatry;  Laterality: Left;   Family History  Problem Relation Age of Onset   Obesity Mother    Sleep apnea Mother    Thyroid disease Mother    Hyperlipidemia Mother    Cancer Mother 79       lung cancer-small cell lung cancer  Heart disease Mother        MI with stents   Hypertension Mother    Diabetes Mother    COPD Mother    Hypertension Father    Hyperlipidemia Father    Cancer Maternal Grandmother        brain cancer    Alcohol abuse Maternal Grandfather    Heart disease Maternal Grandfather    Arthritis Paternal Grandmother    Cancer Paternal Grandmother        leukemia    Hypertension Paternal Grandmother    Breast cancer Paternal Grandmother    Heart disease Paternal Grandfather    Hypertension Paternal Grandfather    Autism spectrum disorder Daughter    Mental illness Son    Cancer Maternal Aunt        breast cancer   Breast cancer Maternal Aunt    Alcohol abuse Maternal Aunt        Liver Failure    Cancer Maternal Uncle        colon cancer   Social History   Socioeconomic History   Marital status: Married    Spouse name: [name]   Number of children: 3   Years of education: 16   Highest education level: Not on file  Occupational History   Occupation: caseworker  Tobacco Use   Smoking status: Never   Smokeless tobacco: Never  Vaping Use   Vaping Use: Never used  Substance and Sexual Activity   Alcohol use: Yes    Comment: 2 DRINK WEEKLY   Drug use: Not Currently    Types: Marijuana    Comment: 12 YEARS AGO   Sexual activity: Yes    Birth control/protection: None, Surgical    Comment: tubal ligation  Other Topics Concern   Not on file  Social History Narrative   Bryer "[name]" grew up in [place], [place]. She attended [employer] and obtained her Bachelors in [employer]. She lives in [place] with her 3 children and her husband       Caffeine - 2 coffee, no sodas   Exercise - not currently   Bachelors degree    Caseworker    4 pregnancies, 3 live births    Feels safe in relationship    Wears seatbelt   No guns       Social Determinants of Health   Financial Resource Strain: Not on file  Food Insecurity: Not on file  Transportation Needs: Not on file  Physical Activity: Not on file  Stress: Not on file  Social Connections: Not on file  Intimate Partner Violence: Not on file   Current Meds  Medication Sig   cetirizine (ZYRTEC) 10 MG tablet Take 10 mg by mouth daily.   cyanocobalamin (,VITAMIN B-12,) 1000 MCG/ML injection inject B12 1x per week x 1 month then every 30 days   diphenhydrAMINE (BENADRYL) 25 MG tablet Take 25 mg by mouth daily as needed for allergies.    EPINEPHrine 0.3 mg/0.3 mL IJ SOAJ injection Inject 0.3 mg into the muscle as needed for anaphylaxis.   famotidine (PEPCID) 20 MG tablet Take 1 tablet (20 mg total) by mouth 2 (two) times daily.   hydrocortisone 2.5 % lotion Apply topically 2 (two) times daily. Neck rash   LORazepam  (ATIVAN) 0.5 MG tablet Take 1 tablet (0.5 mg total) by mouth daily as needed for anxiety.   meloxicam (MOBIC) 15 MG tablet Take 15 mg by mouth daily.   olmesartan-hydrochlorothiazide (BENICAR HCT) 20-12.5 MG tablet Take 1 tablet  by mouth daily. D/c 40-25 in am   ondansetron (ZOFRAN) 4 MG tablet Take 1 tablet (4 mg total) by mouth every 8 (eight) hours as needed for nausea or vomiting.   sucralfate (CARAFATE) 1 GM/10ML suspension Take 10 mLs (1 g total) by mouth 4 (four) times daily.   SYRINGE-NEEDLE, DISP, 3 ML 25G X 1" 3 ML MISC 1 Device by Does not apply route every 30 (thirty) days. Q30 days   tiZANidine (ZANAFLEX) 4 MG capsule Take 1 capsule (4 mg total) by mouth at bedtime as needed for muscle spasms.   Vitamin D, Ergocalciferol, (DRISDOL) 1.25 MG (50000 UNIT) CAPS capsule Take 1 capsule (50,000 Units total) by mouth every 7 (seven) days.   [DISCONTINUED] amLODipine (NORVASC) 5 MG tablet Take 1 tablet by mouth once daily   [DISCONTINUED] venlafaxine XR (EFFEXOR-XR) 150 MG 24 hr capsule TAKE 1 CAPSULE BY MOUTH ONCE DAILY WITH BREAKFAST   Allergies  Allergen Reactions   Other Hives     Nut allergy . Walnuts and hazelnuts are the worst.    Penicillins Itching    Hives    Percocet [Oxycodone-Acetaminophen] Hives    Hives    Ambien [Zolpidem]     Hallucinations falls   Lipitor [Atorvastatin]     Unable to tolerate h/o fatty liver with elevated lfts    Molds & Smuts Other (See Comments)    Congestion and sneezing    Aspirin Itching    hives   Latex Rash    Hives .    No results found for this or any previous visit (from the past 2160 hour(s)). Objective  Body mass index is 38.9 kg/m. Wt Readings from Last 3 Encounters:  09/09/21 219 lb 9.6 oz (99.6 kg)  08/02/21 219 lb (99.3 kg)  06/23/21 217 lb (98.4 kg)   Temp Readings from Last 3 Encounters:  09/09/21 (!) 96.9 F (36.1 C) (Temporal)  08/02/21 98.1 F (36.7 C) (Oral)  06/23/21 97.7 F (36.5 C)   BP Readings from Last  3 Encounters:  09/09/21 124/82  08/02/21 131/76  06/23/21 (!) 135/91   Pulse Readings from Last 3 Encounters:  09/09/21 (!) 111  08/02/21 (!) 109  06/23/21 97    Physical Exam Vitals and nursing note reviewed.  Constitutional:      Appearance: Normal appearance. She is well-developed and well-groomed.  HENT:     Head: Normocephalic and atraumatic.  Eyes:     Conjunctiva/sclera: Conjunctivae normal.     Pupils: Pupils are equal, round, and reactive to light.  Cardiovascular:     Rate and Rhythm: Normal rate and regular rhythm.     Heart sounds: Normal heart sounds. No murmur heard. Pulmonary:     Effort: Pulmonary effort is normal.     Breath sounds: Normal breath sounds.  Abdominal:     General: Abdomen is flat. Bowel sounds are normal.     Tenderness: There is no abdominal tenderness.  Musculoskeletal:        General: No tenderness.  Skin:    General: Skin is warm and dry.     Comments: Rash to neck contact   Neurological:     General: No focal deficit present.     Mental Status: She is alert and oriented to person, place, and time. Mental status is at baseline.     Cranial Nerves: Cranial nerves 2-12 are intact.     Gait: Gait is intact.  Psychiatric:        Attention and Perception:  Attention and perception normal.        Mood and Affect: Mood and affect normal.        Speech: Speech normal.        Behavior: Behavior normal. Behavior is cooperative.        Thought Content: Thought content normal.        Cognition and Memory: Cognition and memory normal.        Judgment: Judgment normal.    Assessment  Plan  Annual physical exam Declined flu shot  Tdap had Declines hep B status check and MMR check moderna 2/2 consider booster Declines STD check hep C/HIV   Pap had 09/07/17 Dr. Marcelline Mates get records appt sch 08/15/19 neg neg HPV   mammo abnormal 01/2018 repeat dx mammo and [place] left, had 07/19/18 repeat ordered 01/18/2019 dx mammo repeat 4/5//21 neg Screening  01/2021 sch 04/29/21 negative    EGD/colonoscpy had 05/01/19 [place] bx negative   DEXA age 12    rec healthy diet and exercise   Dermatitis - Plan: hydrocortisone 2.5 % lotion Rec dove unscented   Anxiety and depression - Plan: venlafaxine XR (EFFEXOR-XR) 150 MG 24 hr capsule  Hypertension, controlled - Plan: amLODipine (NORVASC) 5 MG tablet Benicar 20-12.5 mg qd  Consider change norvasc to BB if continues to have ST  B12 deficiency - Plan: SYRINGE-NEEDLE, DISP, 3 ML 25G X 1" 3 ML MISC, Vitamin B12 B12 shot given today   Chronic pain of right ankle TBD surgeyr   Elevated liver function tests likely 2/2 etoh rec cut back - Plan: Comprehensive metabolic panel         Provider: Dr. Olivia Mackie McLean-Scocuzza-Internal Medicine

## 2021-09-09 NOTE — Patient Instructions (Signed)
Rec total 4 moderna shots

## 2021-09-09 NOTE — Addendum Note (Signed)
Addended by: Thressa Sheller on: 09/09/2021 09:28 AM   Modules accepted: Orders

## 2021-10-13 ENCOUNTER — Other Ambulatory Visit (INDEPENDENT_AMBULATORY_CARE_PROVIDER_SITE_OTHER): Payer: 59

## 2021-10-13 ENCOUNTER — Other Ambulatory Visit: Payer: Self-pay

## 2021-10-13 DIAGNOSIS — E538 Deficiency of other specified B group vitamins: Secondary | ICD-10-CM | POA: Diagnosis not present

## 2021-10-13 DIAGNOSIS — I10 Essential (primary) hypertension: Secondary | ICD-10-CM | POA: Diagnosis not present

## 2021-10-13 DIAGNOSIS — R7989 Other specified abnormal findings of blood chemistry: Secondary | ICD-10-CM

## 2021-10-13 LAB — CBC WITH DIFFERENTIAL/PLATELET
Basophils Absolute: 0.1 10*3/uL (ref 0.0–0.1)
Basophils Relative: 1.1 % (ref 0.0–3.0)
Eosinophils Absolute: 0.2 10*3/uL (ref 0.0–0.7)
Eosinophils Relative: 4.1 % (ref 0.0–5.0)
HCT: 36.9 % (ref 36.0–46.0)
Hemoglobin: 12.4 g/dL (ref 12.0–15.0)
Lymphocytes Relative: 43.8 % (ref 12.0–46.0)
Lymphs Abs: 2.4 10*3/uL (ref 0.7–4.0)
MCHC: 33.6 g/dL (ref 30.0–36.0)
MCV: 95.3 fl (ref 78.0–100.0)
Monocytes Absolute: 0.4 10*3/uL (ref 0.1–1.0)
Monocytes Relative: 7.4 % (ref 3.0–12.0)
Neutro Abs: 2.4 10*3/uL (ref 1.4–7.7)
Neutrophils Relative %: 43.6 % (ref 43.0–77.0)
Platelets: 213 10*3/uL (ref 150.0–400.0)
RBC: 3.87 Mil/uL (ref 3.87–5.11)
RDW: 15.8 % — ABNORMAL HIGH (ref 11.5–15.5)
WBC: 5.5 10*3/uL (ref 4.0–10.5)

## 2021-10-13 LAB — COMPREHENSIVE METABOLIC PANEL
ALT: 35 U/L (ref 0–35)
AST: 60 U/L — ABNORMAL HIGH (ref 0–37)
Albumin: 4 g/dL (ref 3.5–5.2)
Alkaline Phosphatase: 75 U/L (ref 39–117)
BUN: 7 mg/dL (ref 6–23)
CO2: 27 mEq/L (ref 19–32)
Calcium: 8.7 mg/dL (ref 8.4–10.5)
Chloride: 99 mEq/L (ref 96–112)
Creatinine, Ser: 0.7 mg/dL (ref 0.40–1.20)
GFR: 103.77 mL/min (ref >60.00)
Glucose, Bld: 85 mg/dL (ref 70–99)
Potassium: 3.9 mEq/L (ref 3.5–5.1)
Sodium: 137 mEq/L (ref 135–145)
Total Bilirubin: 0.4 mg/dL (ref 0.2–1.2)
Total Protein: 6.4 g/dL (ref 6.0–8.3)

## 2021-10-13 LAB — LIPID PANEL
Cholesterol: 202 mg/dL — ABNORMAL HIGH (ref 0–200)
HDL: 94.4 mg/dL (ref >39.00)
LDL Cholesterol: 69 mg/dL (ref 0–99)
NonHDL: 107.6
Total CHOL/HDL Ratio: 2
Triglycerides: 192 mg/dL — ABNORMAL HIGH (ref 0.0–149.0)
VLDL: 38.4 mg/dL (ref 0.0–40.0)

## 2021-10-13 LAB — VITAMIN B12: Vitamin B-12: 176 pg/mL — ABNORMAL LOW (ref 211–911)

## 2021-10-30 ENCOUNTER — Other Ambulatory Visit: Payer: Self-pay | Admitting: Podiatry

## 2021-10-30 ENCOUNTER — Other Ambulatory Visit (HOSPITAL_COMMUNITY): Payer: Self-pay | Admitting: Podiatry

## 2021-10-30 DIAGNOSIS — M79672 Pain in left foot: Secondary | ICD-10-CM

## 2021-10-30 DIAGNOSIS — M9689 Other intraoperative and postprocedural complications and disorders of the musculoskeletal system: Secondary | ICD-10-CM

## 2021-11-13 ENCOUNTER — Other Ambulatory Visit: Payer: Self-pay

## 2021-11-13 ENCOUNTER — Ambulatory Visit
Admission: RE | Admit: 2021-11-13 | Discharge: 2021-11-13 | Disposition: A | Discharge status: Home / Self Care | Payer: 59 | Source: Ambulatory Visit | Attending: Podiatry | Admitting: Podiatry

## 2021-11-13 DIAGNOSIS — M9689 Other intraoperative and postprocedural complications and disorders of the musculoskeletal system: Secondary | ICD-10-CM | POA: Diagnosis present

## 2021-11-13 DIAGNOSIS — M79672 Pain in left foot: Secondary | ICD-10-CM | POA: Diagnosis present

## 2021-11-20 ENCOUNTER — Other Ambulatory Visit: Payer: Self-pay | Admitting: Podiatry

## 2021-11-26 ENCOUNTER — Encounter
Admission: RE | Admit: 2021-11-26 | Discharge: 2021-11-26 | Disposition: A | Discharge status: Home / Self Care | Payer: 59 | Source: Ambulatory Visit | Attending: Podiatry | Admitting: Podiatry

## 2021-11-26 ENCOUNTER — Other Ambulatory Visit: Payer: Self-pay

## 2021-11-26 NOTE — Patient Instructions (Signed)
Your procedure is scheduled on: 12/04/21 Report to [place]. To find out your arrival time please call [phone] between 1PM - 3PM on 12/03/21.  Remember: Instructions that are not followed completely may result in serious medical risk, up to and including death, or upon the discretion of your surgeon and anesthesiologist your surgery may need to be rescheduled.     _X__ 1. Do not eat food after midnight the night before your procedure.                 No gum chewing or hard candies. You may drink clear liquids up to 2 hours                 before you are scheduled to arrive for your surgery- DO not drink clear                 liquids within 2 hours of the start of your surgery.                 Clear Liquids include:  water, apple juice without pulp, clear carbohydrate                 drink such as Clearfast or Gatorade, Black Coffee or Tea (Do not add                 anything to coffee or tea). Diabetics water only  __X__2.  On the morning of surgery brush your teeth with toothpaste and water, you                 may rinse your mouth with mouthwash if you wish.  Do not swallow any              toothpaste of mouthwash.     _X__ 3.  No Alcohol for 24 hours before or after surgery.   _X__ 4.  Do Not Smoke or use e-cigarettes For 24 Hours Prior to Your Surgery.                 Do not use any chewable tobacco products for at least 6 hours prior to                 surgery.  ____  5.  Bring all medications with you on the day of surgery if instructed.   __X__  6.  Notify your doctor if there is any change in your medical condition      (cold, fever, infections).     Do not wear jewelry, make-up, hairpins, clips or nail polish. Do not wear lotions, powders, or perfumes.  Do not shave body hair 48 hours prior to surgery. Men may shave face and neck. Do not bring valuables to the hospital.    [facility] is not responsible for any  belongings or valuables.  Contacts, dentures/partials or body piercings may not be worn into surgery. Bring a case for your contacts, glasses or hearing aids, a denture cup will be supplied. Leave your suitcase in the car. After surgery it may be brought to your room. For patients admitted to the hospital, discharge time is determined by your treatment team.   Patients discharged the day of surgery will not be allowed to drive home.   Please read over the following fact sheets that you were given:     __X__ Take these medicines the morning of surgery with A SIP OF WATER:  1. amLODipine (NORVASC) 5 MG tablet  2. venlafaxine XR (EFFEXOR-XR) 150 MG 24 hr capsule  3.   4.  5.  6.  ____ Fleet Enema (as directed)   ____ Use CHG Soap/SAGE wipes as directed  ____ Use inhalers on the day of surgery  ____ Stop metformin/Janumet/Farxiga 2 days prior to surgery    ____ Take 1/2 of usual insulin dose the night before surgery. No insulin the morning          of surgery.   ____ Stop Blood Thinners Coumadin/Plavix/Xarelto/Pleta/Pradaxa/Eliquis/Effient/Aspirin  on   Or contact your Surgeon, Cardiologist or Medical Doctor regarding  ability to stop your blood thinners  __X__ Stop Anti-inflammatories 7 days before surgery such as Advil, Ibuprofen, Motrin,  BC or Goodies Powder, Naprosyn, Naproxen, Aleve, Aspirin   YOU MAY USE TYLENOL IF NEEDED   __X__ Stop all herbals and supplements, fish oil or vitamins  until after surgery.    ____ Bring C-Pap to the hospital.    Use DIAL "orange" bar for 3-4 days then the night before and the morning of surgery.      OR You may come by office for our surgical soap and instructions. [phone]

## 2021-11-26 NOTE — Progress Notes (Signed)
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 Following analysis of the risk versus benefits, the patient's primary attending surgeon advised that it would be acceptable to discontinue the ordered clindamycin and place an order for cefazolin 2 gm IV on call to the OR. Orders for this patient were amended by me following collaborative conversation with attending surgeon taking into consideration of risk versus benefits associated with the change in therapy.  [name], MSN, APRN, FNP-C, CEN [facility]  Peri-operative Services Nurse Practitioner Phone: [phone] 11/26/21 4:10 PM

## 2021-11-26 NOTE — Progress Notes (Signed)
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   Screened as appropriate for cephalosporin use during medication reconciliation No immediate angioedema, dysphagia, SOB, anaphylaxis symptoms. No severe rash involving mucous membranes or skin necrosis. No hospital admissions related to side effects of PCN/cephalosporin use.  No documented reaction to PCN or cephalosporin in the last 5 years.  Request:  As an evidence based approach to reducing the rate of incidence for post-operative SSI and the development of MDROs, could an agent with narrower coverage for preoperative prophylaxis in this patient's upcoming surgical course be considered?   Currently ordered preoperative prophylactic ABX: clindamycin.   Specifically requesting change to cephalosporin (CEFAZOLIN).   Please communicate decision with me and I will change the orders in Epic as per your direction.   Things to consider: Many patients report that they were "allergic" to PCN earlier in life, however this does not translate into a true lifelong allergy. Patients can lose sensitivity to specific IgE antibodies over time if PCN is avoided (Kleris & Lugar, 2019).  Up to 10% of the adult population and  15% of hospitalized patients report an allergy to PCN, however clinical studies suggest that 90% of those reporting an allergy can tolerate PCN antibiotics (Kleris & Lugar, 2019).  Cross-sensitivity between PCN and cephalosporins has been documented as being as high as 10%, however this estimation included data believed to have been collected in a setting where there was contamination. Newer data suggests that the prevalence of cross-sensitivity between PCN and cephalosporins is actually estimated to be closer to 1% (Hermanides et al., 2018).   Patients labeled as PCN allergic, whether they are truly allergic or not, have been found to have inferior outcomes in terms of rates of serious infection, and these patients tend to have longer hospital stays (T[place]ah, 2019).  Treatment related secondary infections, such as Clostridioides difficile, have been linked to the improper use of broad spectrum antibiotics in patients improperly labeled as PCN allergic (Kleris & Lugar, 2019).  Anaphylaxis from cephalosporins is rare and the evidence suggests that there is no increased risk of an anaphylactic type reaction when cephalosporins are used in a PCN allergic patient (Pichichero, 2006).  Citations: Hermanides J, Lemkes BA, Prins Pearla Dubonnet MW, Terreehorst I. Presumed ?-Lactam Allergy and Cross-reactivity in the Operating Theater: A Practical Approach. Anesthesiology. 2018 Aug;129(2):335-342. doi: 10.1097/ALN.0000000000002252. PMID: 13244010.  Kleris, [place]., & Lugar, P. L. (2019). Things We Do For No Reason: Failing to Question a Penicillin Allergy History. Journal of hospital medicine, 14(10), [phone]. Advance online publication. https://www.wallace-middleton.info/  Pichichero, M. E. (2006). Cephalosporins can be prescribed safely for penicillin-allergic patients. Journal of family medicine, 55(2), 106-112. Accessed:  https://cdn.mdedge.com/files/s68fs-public/Document/September-2017/5502JFP_AppliedEvidence1.pdf   [name], MSN, APRN, FNP-C, [place]  [place]  Peri-operative Services Nurse Practitioner FAX: [phone] 11/26/21 12:12 PM

## 2021-12-04 ENCOUNTER — Ambulatory Visit
Admission: RE | Admit: 2021-12-04 | Discharge: 2021-12-04 | Disposition: A | Discharge status: Home / Self Care | Payer: 59 | Attending: Podiatry | Admitting: Podiatry

## 2021-12-04 ENCOUNTER — Other Ambulatory Visit: Payer: Self-pay

## 2021-12-04 ENCOUNTER — Ambulatory Visit: Payer: 59 | Admitting: Urgent Care

## 2021-12-04 ENCOUNTER — Encounter
Admission: RE | Disposition: A | Discharge status: Home / Self Care | Payer: Self-pay | Source: Home / Self Care | Attending: Podiatry

## 2021-12-04 ENCOUNTER — Ambulatory Visit: Payer: 59

## 2021-12-04 ENCOUNTER — Encounter: Payer: Self-pay | Admitting: Podiatry

## 2021-12-04 DIAGNOSIS — Z419 Encounter for procedure for purposes other than remedying health state, unspecified: Secondary | ICD-10-CM

## 2021-12-04 DIAGNOSIS — D649 Anemia, unspecified: Secondary | ICD-10-CM | POA: Insufficient documentation

## 2021-12-04 DIAGNOSIS — M960 Pseudarthrosis after fusion or arthrodesis: Secondary | ICD-10-CM | POA: Diagnosis present

## 2021-12-04 DIAGNOSIS — Z6838 Body mass index (BMI) 38.0-38.9, adult: Secondary | ICD-10-CM | POA: Diagnosis not present

## 2021-12-04 DIAGNOSIS — I10 Essential (primary) hypertension: Secondary | ICD-10-CM | POA: Insufficient documentation

## 2021-12-04 DIAGNOSIS — M1990 Unspecified osteoarthritis, unspecified site: Secondary | ICD-10-CM | POA: Diagnosis not present

## 2021-12-04 DIAGNOSIS — G4730 Sleep apnea, unspecified: Secondary | ICD-10-CM | POA: Diagnosis not present

## 2021-12-04 DIAGNOSIS — M9689 Other intraoperative and postprocedural complications and disorders of the musculoskeletal system: Secondary | ICD-10-CM

## 2021-12-04 HISTORY — PX: GRAFT APPLICATION: SHX6696

## 2021-12-04 HISTORY — PX: ORIF CALCANEOUS FRACTURE: SHX5030

## 2021-12-04 LAB — POCT PREGNANCY, URINE: Preg Test, Ur: NEGATIVE

## 2021-12-04 SURGERY — OPEN REDUCTION INTERNAL FIXATION (ORIF) CALCANEOUS FRACTURE
Anesthesia: General | Site: Foot | Laterality: Left

## 2021-12-04 MED ORDER — ONDANSETRON HCL 4 MG/2ML IJ SOLN
INTRAMUSCULAR | Status: AC
  Filled 2021-12-04: qty 2

## 2021-12-04 MED ORDER — DEXAMETHASONE SODIUM PHOSPHATE 10 MG/ML IJ SOLN
INTRAMUSCULAR | Status: DC | PRN
  Administered 2021-12-04: 10 mg via INTRAVENOUS

## 2021-12-04 MED ORDER — NEOMYCIN-POLYMYXIN B GU 40-200000 IR SOLN
Status: AC
  Filled 2021-12-04: qty 2

## 2021-12-04 MED ORDER — CEFAZOLIN SODIUM-DEXTROSE 2-4 GM/100ML-% IV SOLN
2.0000 g | Freq: Once | INTRAVENOUS | Status: AC
Start: 2021-12-04 — End: 2021-12-04
  Administered 2021-12-04: 2 g via INTRAVENOUS

## 2021-12-04 MED ORDER — SUCCINYLCHOLINE CHLORIDE 200 MG/10ML IV SOSY
PREFILLED_SYRINGE | INTRAVENOUS | Status: DC | PRN
  Administered 2021-12-04: 120 mg via INTRAVENOUS

## 2021-12-04 MED ORDER — PROPOFOL 10 MG/ML IV BOLUS
INTRAVENOUS | Status: DC | PRN
  Administered 2021-12-04: 200 mg via INTRAVENOUS

## 2021-12-04 MED ORDER — ACETAMINOPHEN 10 MG/ML IV SOLN
INTRAVENOUS | Status: DC | PRN
  Administered 2021-12-04: 1000 mg via INTRAVENOUS

## 2021-12-04 MED ORDER — CEFAZOLIN SODIUM-DEXTROSE 2-4 GM/100ML-% IV SOLN
INTRAVENOUS | Status: AC
  Filled 2021-12-04: qty 100

## 2021-12-04 MED ORDER — PHENYLEPHRINE HCL (PRESSORS) 10 MG/ML IV SOLN
INTRAVENOUS | Status: AC
  Filled 2021-12-04: qty 1

## 2021-12-04 MED ORDER — HYDROMORPHONE HCL 1 MG/ML IJ SOLN
INTRAMUSCULAR | Status: AC
  Filled 2021-12-04: qty 1

## 2021-12-04 MED ORDER — FENTANYL CITRATE (PF) 100 MCG/2ML IJ SOLN
INTRAMUSCULAR | Status: DC | PRN
  Administered 2021-12-04: 100 ug via INTRAVENOUS
  Administered 2021-12-04 (×3): 50 ug via INTRAVENOUS

## 2021-12-04 MED ORDER — HYDROCODONE-ACETAMINOPHEN 5-325 MG PO TABS: 1.0000 | ORAL_TABLET | Freq: Four times a day (QID) | ORAL | 0 refills | Status: DC | PRN

## 2021-12-04 MED ORDER — SUCCINYLCHOLINE CHLORIDE 200 MG/10ML IV SOSY
PREFILLED_SYRINGE | INTRAVENOUS | Status: AC
  Filled 2021-12-04: qty 10

## 2021-12-04 MED ORDER — CHLORHEXIDINE GLUCONATE 0.12 % MT SOLN
15.0000 mL | Freq: Once | OROMUCOSAL | Status: AC
Start: 2021-12-04 — End: 2021-12-04

## 2021-12-04 MED ORDER — FAMOTIDINE 20 MG PO TABS
ORAL_TABLET | ORAL | Status: AC
Start: 2021-12-04 — End: 2021-12-04
  Administered 2021-12-04: 20 mg via ORAL
  Filled 2021-12-04: qty 1

## 2021-12-04 MED ORDER — BUPIVACAINE HCL (PF) 0.5 % IJ SOLN
INTRAMUSCULAR | Status: AC
  Filled 2021-12-04: qty 20

## 2021-12-04 MED ORDER — SODIUM CHLORIDE 0.9 % IR SOLN
Status: DC | PRN
  Administered 2021-12-04 (×2): 502 mL

## 2021-12-04 MED ORDER — FENTANYL CITRATE (PF) 100 MCG/2ML IJ SOLN
INTRAMUSCULAR | Status: AC
  Filled 2021-12-04: qty 2

## 2021-12-04 MED ORDER — EPHEDRINE 5 MG/ML INJ
INTRAVENOUS | Status: AC
  Filled 2021-12-04: qty 5

## 2021-12-04 MED ORDER — FENTANYL CITRATE (PF) 100 MCG/2ML IJ SOLN
INTRAMUSCULAR | Status: AC
Start: 2021-12-04 — End: ?
  Filled 2021-12-04: qty 2

## 2021-12-04 MED ORDER — BUPIVACAINE HCL (PF) 0.5 % IJ SOLN
INTRAMUSCULAR | Status: AC
  Filled 2021-12-04: qty 10

## 2021-12-04 MED ORDER — HYDROMORPHONE HCL 1 MG/ML IJ SOLN
INTRAMUSCULAR | Status: DC | PRN
  Administered 2021-12-04: 1 mg via INTRAVENOUS

## 2021-12-04 MED ORDER — METOCLOPRAMIDE HCL 10 MG PO TABS: 5.0000 mg | ORAL_TABLET | Freq: Three times a day (TID) | ORAL | Status: DC | PRN

## 2021-12-04 MED ORDER — BUPIVACAINE LIPOSOME 1.3 % IJ SUSP
INTRAMUSCULAR | Status: AC
  Filled 2021-12-04: qty 20

## 2021-12-04 MED ORDER — LIDOCAINE HCL (CARDIAC) PF 100 MG/5ML IV SOSY
PREFILLED_SYRINGE | INTRAVENOUS | Status: DC | PRN
Start: 2021-12-04 — End: 2021-12-04
  Administered 2021-12-04: 100 mg via INTRAVENOUS

## 2021-12-04 MED ORDER — OXYCODONE HCL 5 MG PO TABS
5.0000 mg | ORAL_TABLET | Freq: Once | ORAL | Status: AC | PRN
  Administered 2021-12-04: 5 mg via ORAL

## 2021-12-04 MED ORDER — ACETAMINOPHEN 10 MG/ML IV SOLN
INTRAVENOUS | Status: AC
  Filled 2021-12-04: qty 100

## 2021-12-04 MED ORDER — BUPIVACAINE HCL (PF) 0.5 % IJ SOLN
INTRAMUSCULAR | Status: DC | PRN
  Administered 2021-12-04: 10 mL via PERINEURAL

## 2021-12-04 MED ORDER — BUPIVACAINE HCL (PF) 0.5 % IJ SOLN
INTRAMUSCULAR | Status: DC | PRN
  Administered 2021-12-04: 10 mL

## 2021-12-04 MED ORDER — LIDOCAINE HCL (PF) 1 % IJ SOLN
INTRAMUSCULAR | Status: DC | PRN
  Administered 2021-12-04 (×2): 1 mL via SUBCUTANEOUS

## 2021-12-04 MED ORDER — LIDOCAINE HCL (PF) 1 % IJ SOLN
INTRAMUSCULAR | Status: AC
  Filled 2021-12-04: qty 5

## 2021-12-04 MED ORDER — LACTATED RINGERS IV SOLN: INTRAVENOUS | Status: DC

## 2021-12-04 MED ORDER — EPHEDRINE SULFATE (PRESSORS) 50 MG/ML IJ SOLN
INTRAMUSCULAR | Status: DC | PRN
  Administered 2021-12-04: 5 mg via INTRAVENOUS
  Administered 2021-12-04: 10 mg via INTRAVENOUS

## 2021-12-04 MED ORDER — ACETAMINOPHEN 10 MG/ML IV SOLN: 1000.0000 mg | Freq: Once | INTRAVENOUS | Status: DC | PRN

## 2021-12-04 MED ORDER — PHENYLEPHRINE HCL-NACL 20-0.9 MG/250ML-% IV SOLN
INTRAVENOUS | Status: AC
  Filled 2021-12-04: qty 250

## 2021-12-04 MED ORDER — OXYCODONE HCL 5 MG PO TABS
ORAL_TABLET | ORAL | Status: AC
  Filled 2021-12-04: qty 1

## 2021-12-04 MED ORDER — GLYCOPYRROLATE 0.2 MG/ML IJ SOLN
INTRAMUSCULAR | Status: AC
  Filled 2021-12-04: qty 1

## 2021-12-04 MED ORDER — BUPIVACAINE HCL (PF) 0.5 % IJ SOLN
INTRAMUSCULAR | Status: AC
  Filled 2021-12-04: qty 30

## 2021-12-04 MED ORDER — PROMETHAZINE HCL 25 MG/ML IJ SOLN: 6.2500 mg | INTRAMUSCULAR | Status: DC | PRN

## 2021-12-04 MED ORDER — ONDANSETRON HCL 4 MG PO TABS
4.0000 mg | ORAL_TABLET | Freq: Four times a day (QID) | ORAL | Status: DC | PRN
Start: 2021-12-04 — End: 2021-12-04

## 2021-12-04 MED ORDER — LIDOCAINE HCL (PF) 1 % IJ SOLN
INTRAMUSCULAR | Status: AC
  Filled 2021-12-04: qty 30

## 2021-12-04 MED ORDER — OXYCODONE HCL 5 MG/5ML PO SOLN: 5.0000 mg | Freq: Once | ORAL | Status: AC | PRN

## 2021-12-04 MED ORDER — MIDAZOLAM HCL 2 MG/2ML IJ SOLN
INTRAMUSCULAR | Status: DC | PRN
  Administered 2021-12-04: 2 mg via INTRAVENOUS

## 2021-12-04 MED ORDER — ROCURONIUM BROMIDE 10 MG/ML (PF) SYRINGE
PREFILLED_SYRINGE | INTRAVENOUS | Status: AC
  Filled 2021-12-04: qty 10

## 2021-12-04 MED ORDER — CHLORHEXIDINE GLUCONATE 0.12 % MT SOLN
OROMUCOSAL | Status: AC
  Administered 2021-12-04: 15 mL via OROMUCOSAL
  Filled 2021-12-04: qty 15

## 2021-12-04 MED ORDER — FENTANYL CITRATE (PF) 100 MCG/2ML IJ SOLN
25.0000 ug | INTRAMUSCULAR | Status: DC | PRN
  Administered 2021-12-04: 50 ug via INTRAVENOUS

## 2021-12-04 MED ORDER — DEXAMETHASONE SODIUM PHOSPHATE 10 MG/ML IJ SOLN
INTRAMUSCULAR | Status: AC
  Filled 2021-12-04: qty 1

## 2021-12-04 MED ORDER — LIDOCAINE HCL (PF) 2 % IJ SOLN
INTRAMUSCULAR | Status: AC
  Filled 2021-12-04: qty 5

## 2021-12-04 MED ORDER — FAMOTIDINE 20 MG PO TABS: 20.0000 mg | ORAL_TABLET | Freq: Once | ORAL | Status: AC

## 2021-12-04 MED ORDER — SODIUM CHLORIDE (PF) 0.9 % IJ SOLN
INTRAMUSCULAR | Status: AC
  Filled 2021-12-04: qty 50

## 2021-12-04 MED ORDER — METOCLOPRAMIDE HCL 5 MG/ML IJ SOLN: 5.0000 mg | Freq: Three times a day (TID) | INTRAMUSCULAR | Status: DC | PRN

## 2021-12-04 MED ORDER — ONDANSETRON HCL 4 MG/2ML IJ SOLN: 4.0000 mg | Freq: Four times a day (QID) | INTRAMUSCULAR | Status: DC | PRN

## 2021-12-04 MED ORDER — DEXMEDETOMIDINE (PRECEDEX) IN NS 20 MCG/5ML (4 MCG/ML) IV SYRINGE
PREFILLED_SYRINGE | INTRAVENOUS | Status: DC | PRN
  Administered 2021-12-04 (×2): 8 ug via INTRAVENOUS
  Administered 2021-12-04: 4 ug via INTRAVENOUS
  Administered 2021-12-04: 8 ug via INTRAVENOUS
  Administered 2021-12-04: 4 ug via INTRAVENOUS
  Administered 2021-12-04: 8 ug via INTRAVENOUS

## 2021-12-04 MED ORDER — PROPOFOL 10 MG/ML IV BOLUS
INTRAVENOUS | Status: AC
  Filled 2021-12-04: qty 40

## 2021-12-04 MED ORDER — BUPIVACAINE LIPOSOME 1.3 % IJ SUSP
INTRAMUSCULAR | Status: DC | PRN
  Administered 2021-12-04: 20 mL

## 2021-12-04 MED ORDER — ONDANSETRON HCL 4 MG/2ML IJ SOLN
INTRAMUSCULAR | Status: DC | PRN
  Administered 2021-12-04: 4 mg via INTRAVENOUS

## 2021-12-04 MED ORDER — ORAL CARE MOUTH RINSE: 15.0000 mL | Freq: Once | OROMUCOSAL | Status: AC

## 2021-12-04 MED ORDER — MIDAZOLAM HCL 2 MG/2ML IJ SOLN
INTRAMUSCULAR | Status: AC
Start: 2021-12-04 — End: ?
  Filled 2021-12-04: qty 2

## 2021-12-04 MED ORDER — KETOROLAC TROMETHAMINE 30 MG/ML IJ SOLN
INTRAMUSCULAR | Status: AC
  Administered 2021-12-04: 30 mg
  Filled 2021-12-04: qty 1

## 2021-12-04 SURGICAL SUPPLY — 59 items
BENZOIN TINCTURE PRP APPL 2/3 (GAUZE/BANDAGES/DRESSINGS) ×1 IMPLANT
BIT DRILL 2 FENESTRATED (MISCELLANEOUS) IMPLANT
BIT DRILL 2.4X140 LONG SOLID (BIT) ×1 IMPLANT
BIT DRILL SOLID 2.0 X 110MM (DRILL) IMPLANT
BIT DRILLL 2 FENESTRATED (MISCELLANEOUS) ×2
BLADE OSC/SAGITTAL 5.5X25 (BLADE) ×1 IMPLANT
BLADE OSC/SAGITTAL MD 9X18.5 (BLADE) ×1 IMPLANT
BNDG COHESIVE 4X5 TAN ST LF (GAUZE/BANDAGES/DRESSINGS) ×2 IMPLANT
BNDG CONFORM 2 STRL LF (GAUZE/BANDAGES/DRESSINGS) ×2 IMPLANT
BNDG CONFORM 3 STRL LF (GAUZE/BANDAGES/DRESSINGS) ×1 IMPLANT
BNDG ELASTIC 4X5.8 VLCR NS LF (GAUZE/BANDAGES/DRESSINGS) ×2 IMPLANT
BNDG ESMARK 4X12 TAN STRL LF (GAUZE/BANDAGES/DRESSINGS) ×2 IMPLANT
BNDG GAUZE ELAST 4 BULKY (GAUZE/BANDAGES/DRESSINGS) ×2 IMPLANT
BONE CANC CHIPS 20CC PCAN1/4 (Bone Implant) ×2 IMPLANT
BOOT STEPPER DURA MED (SOFTGOODS) ×1 IMPLANT
CHIPS CANC BONE 20CC PCAN1/4 (Bone Implant) ×1 IMPLANT
COVER PIN YLW 0.028-062 (MISCELLANEOUS) ×3 IMPLANT
DRAPE FLUOR MINI C-ARM 54X84 (DRAPES) ×1 IMPLANT
DRILL SOLID 2.0 X 110MM (DRILL) ×2
DURAPREP 26ML APPLICATOR (WOUND CARE) ×2 IMPLANT
ELECT REM PT RETURN 9FT ADLT (ELECTROSURGICAL) ×2
ELECTRODE REM PT RTRN 9FT ADLT (ELECTROSURGICAL) ×1 IMPLANT
GAUZE SPONGE 4X4 12PLY STRL (GAUZE/BANDAGES/DRESSINGS) ×2 IMPLANT
GAUZE XEROFORM 1X8 LF (GAUZE/BANDAGES/DRESSINGS) ×2 IMPLANT
GLOVE SURG ENC MOIS LTX SZ7.5 (GLOVE) ×2 IMPLANT
GLOVE SURG UNDER LTX SZ8 (GLOVE) ×2 IMPLANT
GOWN STRL REUS W/ TWL XL LVL3 (GOWN DISPOSABLE) ×2 IMPLANT
GOWN STRL REUS W/TWL XL LVL3 (GOWN DISPOSABLE) ×4
GRAFT BNE CANC CHIPS 1-8 20CC (Bone Implant) IMPLANT
K-WIRE SMOOTH TROCAR 2.0X150 (WIRE) ×2
KIT TURNOVER KIT A (KITS) ×2 IMPLANT
KWIRE SMOOTH TROCAR 2.0X150 (WIRE) IMPLANT
MANIFOLD NEPTUNE II (INSTRUMENTS) ×2 IMPLANT
NDL HYPO 25X1 1.5 SAFETY (NEEDLE) ×2 IMPLANT
NEEDLE HYPO 22GX1.5 SAFETY (NEEDLE) ×2 IMPLANT
NEEDLE HYPO 25X1 1.5 SAFETY (NEEDLE) ×4 IMPLANT
NS IRRIG 500ML POUR BTL (IV SOLUTION) ×3 IMPLANT
PACK EXTREMITY ARMC (MISCELLANEOUS) ×2 IMPLANT
PENCIL ELECTRO HAND CTR (MISCELLANEOUS) ×2 IMPLANT
PLATE 20MM DOGBONE (Plate) ×1 IMPLANT
SCREW LOCK PLATE R3 2.7X15 (Screw) ×1 IMPLANT
SCREW LOCK PLATE R3 2.7X18 (Screw) ×1 IMPLANT
SCREW LOCK PLATE R3 3.5X16 (Screw) ×2 IMPLANT
SCREW LOCK PLATE R3 3.5X20 (Screw) ×1 IMPLANT
SPLINT CAST 1 STEP 4X30 (MISCELLANEOUS) ×2 IMPLANT
SPLINT FAST PLASTER 5X30 (CAST SUPPLIES) ×1
SPLINT PLASTER CAST FAST 5X30 (CAST SUPPLIES) ×1 IMPLANT
STIMULATOR BONE (ORTHOPEDIC SUPPLIES) ×2
STIMULATOR BONE GROWTH EMG EXT (ORTHOPEDIC SUPPLIES) IMPLANT
STOCKINETTE M/LG 89821 (MISCELLANEOUS) ×2 IMPLANT
STRIP CLOSURE SKIN 1/4X4 (GAUZE/BANDAGES/DRESSINGS) ×2 IMPLANT
SUT MNCRL AB 4-0 PS2 18 (SUTURE) ×1 IMPLANT
SUT VIC AB 3-0 SH 27 (SUTURE) ×2
SUT VIC AB 3-0 SH 27X BRD (SUTURE) IMPLANT
SUT VIC AB 4-0 SH 27 (SUTURE) ×2
SUT VIC AB 4-0 SH 27XANBCTRL (SUTURE) IMPLANT
SYR 10ML LL (SYRINGE) ×6 IMPLANT
WIRE OLIVE SMOOTH 1.4MMX60MM (WIRE) ×2 IMPLANT
WIRE Z .062 C-WIRE SPADE TIP (WIRE) ×2 IMPLANT

## 2021-12-04 NOTE — Op Note (Signed)
Operative note   Surgeon:Iisha Soyars Lawyer: None    Preop diagnosis: Nonunion left calcaneal osteotomy    Postop diagnosis: Same    Procedure: 1.  Revision and repair nonunion left calcaneal osteotomy 2.  Harvest posterior superior calcaneal bone graft 3.  Application of external bone stimulator    EBL: Minimal    Anesthesia:local and general.  Local consisted of a total of 20 cc of Exparel long-acting anesthetic and 10 cc of 0.5% bupivacaine    Hemostasis: Mid calf tourniquet inflated to 200 mmHg for 110 minutes    Specimen: None    Complications: None    Operative indications:Valerie Morales is an 46 y.o. that presents today for surgical intervention.  The risks/benefits/alternatives/complications have been discussed and consent has been given.    Procedure:  Patient was brought into the OR and placed on the operating table in thesupine position. After anesthesia was obtained theleft lower extremity was prepped and draped in usual sterile fashion.  Attention was directed to the lateral aspect of the calcaneus where longitudinal incision was performed along the area of the peroneal tendon at the calcaneocuboid joint region.  Sharp and blunt dissection carried down to the peroneal tendons.  These were retracted plantarly throughout the entire procedure.  Subperiosteal dissection revealed the nonunion at the Evans calcaneal osteotomy site.  The nonunion was taken down with a combination of curettage and rongeur.  This was taken down to healthy normal bleeding bone.  At this time attention was directed to the posterior aspect of the calcaneus where a separate incision was performed from the superior aspect of the calcaneus to the lateral body of the calcaneus.  Sharp and blunt dissection carried down to the periosteum.  Care was taken to retract the neurovascular structures.  The sural nerve was not encountered during the procedure.  At this time the posterior aspect and lateral  aspect of the superior calcaneus was then marked and mapped out.  An 8 mm x 2 cm calcaneal bone graft was then hard for this.  This was removed from the surgical field.  The medial cortex was left intact.  Attention was redirected to the nonunion site.  The nonunion site was then drilled with a 2.0 mm drill bit.  The harvested bone graft was then placed within the nonunion site.  This was impacted into the area.  Finally a small wish bone plate from the [employer] screw set was placed to stabilize the nonunion and graft site.  2.7 screws were used distally and 3.5 screws were used proximally locking into the wish bone plate.  Good stability was noted.  All wounds were flushed with copious amounts of irrigation.  The graft harvest site was packed with bone chips.  Closure was performed with a combination of 3-0 and 4-0 Vicryl and a 4-0 Monocryl for skin.  A bulky sterile dressing was applied and the patient was placed in neutral position in an equalizer walker boot.  She will remain nonweightbearing.  A prescription for Norco was sent to her pharmacy as she had a Percocet allergy.    Patient tolerated the procedure and anesthesia well.  Was transported from the OR to the PACU with all vital signs stable and vascular status intact. To be discharged per routine protocol.  Will follow up in approximately 1 week in the outpatient clinic.

## 2021-12-04 NOTE — H&P (Signed)
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  The various methods of treatment have been discussed with the patient.  No guarantees were given.  After consideration of risks, benefits and other options for treatment, the patient has consented to surgery.  I have reviewed the patients chart and labs.     A history and physical examination was performed in my office.  The patient was reexamined.  There have been no changes to this history and physical examination.  Samara Deist A

## 2021-12-04 NOTE — Anesthesia Procedure Notes (Signed)
Procedure Name: Intubation Date/Time: 12/04/2021 7:36 AM Performed by: Demetrius Charity, CRNA Pre-anesthesia Checklist: Patient identified, Patient being monitored, Timeout performed, Emergency Drugs available and Suction available Patient Re-evaluated:Patient Re-evaluated prior to induction Oxygen Delivery Method: Circle system utilized Preoxygenation: Pre-oxygenation with 100% oxygen Induction Type: IV induction Ventilation: Mask ventilation without difficulty Laryngoscope Size: 3 and McGraph Grade View: Grade I Tube type: Oral Tube size: 6.5 mm Number of attempts: 1 Airway Equipment and Method: Stylet Placement Confirmation: ETT inserted through vocal cords under direct vision, positive ETCO2 and breath sounds checked- equal and bilateral Secured at: 20 cm Tube secured with: Tape Dental Injury: Teeth and Oropharynx as per pre-operative assessment

## 2021-12-04 NOTE — Anesthesia Postprocedure Evaluation (Signed)
Anesthesia Post Note  Patient: Valerie Morales  Procedure(s) Performed: REPAIR NONUNION - CALCANEAL EVANS OSTEOTOMY SITE (Left: Foot) GRAFT APPLICATION - CALCANEAL AUTOGRAFT (Left: Foot)  Patient location during evaluation: PACU Anesthesia Type: General Level of consciousness: awake and alert Pain management: pain level controlled Vital Signs Assessment: post-procedure vital signs reviewed and stable Respiratory status: spontaneous breathing, nonlabored ventilation and respiratory function stable Cardiovascular status: blood pressure returned to baseline and stable Postop Assessment: no apparent nausea or vomiting Anesthetic complications: no   No notable events documented.   Last Vitals:  Vitals:   12/04/21 1130 12/04/21 1148  BP: 135/88 129/86  Pulse: (!) 114 (!) 113  Resp: (!) 21 18  Temp: 36.4 C 36.6 C  SpO2: 95% 97%    Last Pain:  Vitals:   12/04/21 1148  TempSrc: Temporal  PainSc: [place]

## 2021-12-04 NOTE — Anesthesia Procedure Notes (Signed)
Anesthesia Regional Block: Popliteal block   Pre-Anesthetic Checklist: , timeout performed,  Correct Patient, Correct Site, Correct Laterality,  Correct Procedure, Correct Position, site marked,  Risks and benefits discussed,  Surgical consent,  Pre-op evaluation,  At surgeon's request and post-op pain management  Laterality: Left  Prep: chloraprep       Needles:  Injection technique: Single-shot  Needle Type: Stimiplex     Needle Length: 9cm  Needle Gauge: 22     Additional Needles:   Procedures:,,,, ultrasound used (permanent image in chart),,    Narrative:  Start time: 12/04/2021 11:15 AM End time: 12/04/2021 11:20 AM Injection made incrementally with aspirations every 20 mL.  Performed by: Personally  Anesthesiologist: Iran Ouch, MD  Additional Notes: Patient consented for risk and benefits of nerve block including but not limited to nerve damage, failed block, bleeding and infection.  Patient voiced understanding.  Functioning IV was confirmed and monitors were applied.  Timeout done prior to procedure and prior to any sedation being given to the patient.  Patient confirmed procedure site prior to any sedation given to the patient.  A 40mm 22ga Stimuplex needle was used. Sterile prep,hand hygiene and sterile gloves were used.  Minimal sedation used for procedure.  No paresthesia endorsed by patient during the procedure.  Negative aspiration and negative test dose prior to incremental administration of local anesthetic. The patient tolerated the procedure well with no immediate complications.

## 2021-12-04 NOTE — Anesthesia Procedure Notes (Signed)
Anesthesia Regional Block: Adductor canal block   Pre-Anesthetic Checklist: , timeout performed,  Correct Patient, Correct Site, Correct Laterality,  Correct Procedure, Correct Position, site marked,  Risks and benefits discussed,  Surgical consent,  Pre-op evaluation,  At surgeon's request and post-op pain management  Laterality: Left  Prep: chloraprep       Needles:  Injection technique: Single-shot  Needle Type: Stimiplex     Needle Length: 9cm  Needle Gauge: 22     Additional Needles:   Procedures:,,,, ultrasound used (permanent image in chart),,    Narrative:  Start time: 12/04/2021 11:25 AM End time: 12/04/2021 11:27 AM Injection made incrementally with aspirations every 20 mL.  Performed by: Personally  Anesthesiologist: Iran Ouch, MD  Additional Notes: Patient consented for risk and benefits of nerve block including but not limited to nerve damage, failed block, bleeding and infection.  Patient voiced understanding.  Functioning IV was confirmed and monitors were applied.  Timeout done prior to procedure and prior to any sedation being given to the patient.  Patient confirmed procedure site prior to any sedation given to the patient.  A 29mm 22ga Stimuplex needle was used. Sterile prep,hand hygiene and sterile gloves were used.  Minimal sedation used for procedure.  No paresthesia endorsed by patient during the procedure.  Negative aspiration and negative test dose prior to incremental administration of local anesthetic. The patient tolerated the procedure well with no immediate complications.

## 2021-12-04 NOTE — Transfer of Care (Signed)
Immediate Anesthesia Transfer of Care Note  Patient: Valerie Morales  Procedure(s) Performed: REPAIR NONUNION - CALCANEAL EVANS OSTEOTOMY SITE (Left: Foot) GRAFT APPLICATION - CALCANEAL AUTOGRAFT (Left: Foot)  Patient Location: PACU  Anesthesia Type:General  Level of Consciousness: awake, alert  and oriented  Airway & Oxygen Therapy: Patient Spontanous Breathing and Patient connected to face mask oxygen  Post-op Assessment: Report given to RN and Post -op Vital signs reviewed and stable  Post vital signs: Reviewed and stable  Last Vitals:  Vitals Value Taken Time  BP 121/63 12/04/21 1011  Temp    Pulse 112 12/04/21 1015  Resp 18 12/04/21 1015  SpO2 98 % 12/04/21 1015  Vitals shown include unvalidated device data.  Last Pain:  Vitals:   12/04/21 0656  TempSrc: Temporal  PainSc: 0-No pain         Complications: No notable events documented.

## 2021-12-04 NOTE — Anesthesia Preprocedure Evaluation (Addendum)
Anesthesia Evaluation  Patient identified by MRN, date of birth, ID band Patient awake    Reviewed: Allergy & Precautions, NPO status , Patient's Chart, lab work & pertinent test results  History of Anesthesia Complications Negative for: history of anesthetic complications  Airway Mallampati: III  TM Distance: >3 FB Neck ROM: Full    Dental  (+) Missing,    Pulmonary sleep apnea , neg COPD, Patient abstained from smoking.Not current smoker,    Pulmonary exam normal breath sounds clear to auscultation       Cardiovascular Exercise Tolerance: Good METShypertension, Pt. on medications (-) CAD and (-) Past MI Normal cardiovascular exam(-) dysrhythmias  Rhythm:Regular Rate:Normal - Systolic murmurs    Neuro/Psych  Headaches, PSYCHIATRIC DISORDERS Anxiety Depression    GI/Hepatic Neg liver ROS, neg GERD  ,  Endo/Other  neg diabetesMorbid obesityS/p gastric sleeve  Renal/GU negative Renal ROS     Musculoskeletal  (+) Arthritis ,   Abdominal Normal abdominal exam  (+) + obese,   Peds negative pediatric ROS (+)  Hematology  (+) Blood dyscrasia, anemia ,   Anesthesia Other Findings Past Medical History: No date: Anemia     Comment:  has required 2 units PRBC No date: Anxiety No date: Chicken pox No date: Depression     Comment:  trazadone has not helped in the past  No date: Fatty liver No date: Headache 2009: History of blood transfusion     Comment:  [place] No date: Hypertension No date: MRSA (methicillin resistant Staphylococcus aureus) No date: Sinusitis No date: Sinusitis, chronic No date: UTI (urinary tract infection) No date: Vitamin D deficiency  Reproductive/Obstetrics                            Anesthesia Physical  Anesthesia Plan  ASA: III  Anesthesia Plan: General   Post-op Pain Management: Toradol IV (intra-op)*, Ofirmev IV (intra-op)* and Regional block*    Induction: Intravenous  PONV Risk Score and Plan: 3 and Midazolam, Dexamethasone and Ondansetron  Airway Management Planned: Video Laryngoscope Planned and Oral ETT  Additional Equipment: None  Intra-op Plan:   Post-operative Plan: Extubation in OR  Informed Consent: I have reviewed the patients History and Physical, chart, labs and discussed the procedure including the risks, benefits and alternatives for the proposed anesthesia with the patient or authorized representative who has indicated his/her understanding and acceptance.     Dental advisory given  Plan Discussed with: CRNA and Surgeon  Anesthesia Plan Comments: (  Consented for post-op regional anesthesia if needed )      Anesthesia Quick Evaluation

## 2021-12-04 NOTE — Discharge Instructions (Addendum)
[facility]  POST OPERATIVE INSTRUCTIONS FOR DR. FOWLER AND [facility] PODIAT[facility]PART[facility]   Take your medication as prescribed.  Pain medication should be taken only as needed.  Keep the dressing clean, dry and intact.  Keep your foot elevated above the heart level for the first 48 hours.  We have instructed you to be non-weight bearing.  Always wear your post-op shoe when walking.  Always use your crutches if you are to be non-weight bearing.  Do not take a shower. Baths are permissible as long as the foot is kept out of the water.   Every hour you are awake:  Bend your knee 15 times.  Call [facility] ([phone]) if any of the following problems occur: You develop a temperature or fever. The bandage becomes saturated with blood. Medication does not stop your pain. Injury of the foot occurs. Any symptoms of infection including redness, odor, or red streaks running from wound.  AMBULATORY SURGERY  DISCHARGE INSTRUCTIONS   The drugs that you were given will stay in your system until tomorrow so for the next 24 hours you should not:  Drive an automobile Make any legal decisions Drink any alcoholic beverage   You may resume regular meals tomorrow.  Today it is better to start with liquids and gradually work up to solid foods.  You may eat anything you prefer, but it is better to start with liquids, then soup and crackers, and gradually work up to solid foods.   Please notify your doctor immediately if you have any unusual bleeding, trouble breathing, redness and pain at the surgery site, drainage, fever, or pain not relieved by medication.    Additional Instructions:  Please contact your physician with any problems or Same Day Surgery at [phone], Monday through Friday 6 am to 4 pm, or [place] at [place] Main number at [phone]. 

## 2021-12-07 ENCOUNTER — Encounter: Payer: Self-pay | Admitting: Podiatry

## 2022-01-19 ENCOUNTER — Other Ambulatory Visit: Payer: Self-pay | Admitting: Internal Medicine

## 2022-01-19 DIAGNOSIS — G4700 Insomnia, unspecified: Secondary | ICD-10-CM

## 2022-01-19 DIAGNOSIS — F419 Anxiety disorder, unspecified: Secondary | ICD-10-CM

## 2022-01-19 DIAGNOSIS — F32A Depression, unspecified: Secondary | ICD-10-CM

## 2022-03-04 ENCOUNTER — Other Ambulatory Visit: Payer: Self-pay | Admitting: Podiatry

## 2022-03-04 DIAGNOSIS — M9689 Other intraoperative and postprocedural complications and disorders of the musculoskeletal system: Secondary | ICD-10-CM

## 2022-03-10 ENCOUNTER — Ambulatory Visit: Payer: 59 | Admitting: Internal Medicine

## 2022-04-07 ENCOUNTER — Inpatient Hospital Stay: Admission: RE | Admit: 2022-04-07 | Payer: 59 | Source: Ambulatory Visit

## 2022-04-08 ENCOUNTER — Ambulatory Visit
Admission: RE | Admit: 2022-04-08 | Discharge: 2022-04-08 | Disposition: A | Discharge status: Home / Self Care | Payer: 59 | Source: Ambulatory Visit | Attending: Podiatry | Admitting: Podiatry

## 2022-04-08 DIAGNOSIS — M9689 Other intraoperative and postprocedural complications and disorders of the musculoskeletal system: Secondary | ICD-10-CM

## 2022-05-01 ENCOUNTER — Other Ambulatory Visit: Payer: Self-pay | Admitting: Internal Medicine

## 2022-05-01 DIAGNOSIS — I10 Essential (primary) hypertension: Secondary | ICD-10-CM

## 2022-05-26 ENCOUNTER — Encounter (INDEPENDENT_AMBULATORY_CARE_PROVIDER_SITE_OTHER): Payer: Self-pay

## 2022-06-16 ENCOUNTER — Other Ambulatory Visit: Payer: Self-pay | Admitting: Internal Medicine

## 2022-06-16 DIAGNOSIS — R110 Nausea: Secondary | ICD-10-CM

## 2022-07-14 ENCOUNTER — Other Ambulatory Visit: Payer: Self-pay | Admitting: Internal Medicine

## 2022-07-14 DIAGNOSIS — I10 Essential (primary) hypertension: Secondary | ICD-10-CM

## 2022-07-14 MED ORDER — AMLODIPINE BESYLATE 5 MG PO TABS: 5.0000 mg | ORAL_TABLET | Freq: Every day | ORAL | 2 refills | Status: DC

## 2022-07-14 NOTE — Telephone Encounter (Signed)
Sch f/u in 3-6 months TOC Dr. Volanda Napoleon

## 2022-11-22 ENCOUNTER — Other Ambulatory Visit: Payer: Self-pay

## 2022-11-22 ENCOUNTER — Telehealth: Payer: Self-pay | Admitting: Internal Medicine

## 2022-11-22 DIAGNOSIS — I10 Essential (primary) hypertension: Secondary | ICD-10-CM

## 2022-11-22 MED ORDER — AMLODIPINE BESYLATE 5 MG PO TABS: 5.0000 mg | ORAL_TABLET | Freq: Every day | ORAL | 0 refills | Status: DC

## 2022-11-22 NOTE — Telephone Encounter (Signed)
30 days upply of amlodipine sent enough to cover until [facility] w/ [facility] 12-22-22 Lorazepam request sent to [place] for approval

## 2022-11-22 NOTE — Telephone Encounter (Signed)
Pt need a refill on amLODipine and LORazepam sent to [facility] [address] [place]

## 2022-12-02 ENCOUNTER — Other Ambulatory Visit: Payer: Self-pay | Admitting: Family

## 2022-12-02 ENCOUNTER — Telehealth: Payer: Self-pay | Admitting: Family Medicine

## 2022-12-02 DIAGNOSIS — G4700 Insomnia, unspecified: Secondary | ICD-10-CM

## 2022-12-02 DIAGNOSIS — F32A Depression, unspecified: Secondary | ICD-10-CM

## 2022-12-02 MED ORDER — LORAZEPAM 0.5 MG PO TABS: 0.5000 mg | ORAL_TABLET | Freq: Every day | ORAL | 2 refills | Status: DC | PRN

## 2022-12-02 MED ORDER — VENLAFAXINE HCL ER 150 MG PO CP24: 150.0000 mg | ORAL_CAPSULE | Freq: Every day | ORAL | 3 refills | Status: DC

## 2022-12-02 NOTE — Telephone Encounter (Signed)
Prescription Request  12/02/2022  Is this a "Controlled Substance" medicine? Yes  LOV: Visit date not found  What is the name of the medication or equipment? venlafaxine XR (EFFEXOR-XR) 150 MG 24 hr capsule and LORazepam (ATIVAN) 0.5 MG tablet  Have you contacted your pharmacy to request a refill? Yes   Which pharmacy would you like this sent to?  [facility] ([facility] [place] - [address] [place] ([place] [address] Phone: [phone] Fax: [phone]    Patient notified that their request is being sent to the clinical staff for review and that they should receive a response within 2 business days.   Please advise at Mobile [phone] (mobile)

## 2022-12-20 ENCOUNTER — Ambulatory Visit
Admission: EM | Admit: 2022-12-20 | Discharge: 2022-12-20 | Disposition: A | Discharge status: Home / Self Care | Payer: 59 | Attending: Emergency Medicine | Admitting: Emergency Medicine

## 2022-12-20 DIAGNOSIS — J0100 Acute maxillary sinusitis, unspecified: Secondary | ICD-10-CM

## 2022-12-20 MED ORDER — CEFDINIR 300 MG PO CAPS: 300.0000 mg | ORAL_CAPSULE | Freq: Two times a day (BID) | ORAL | 0 refills | Status: AC

## 2022-12-20 NOTE — ED Provider Notes (Signed)
Valerie Morales    CSN: [id number] Arrival date & time: 12/20/22  0841      History   Chief Complaint Chief Complaint  Patient presents with   Nasal Congestion    Entered by patient   Facial Pain   Hoarse    HPI Valerie Morales is a 47 y.o. female.  Patient presents with 2-week history of sinus pressure, sinus headache, postnasal drip, occasional nose bleed, hoarse voice, cough.  Treating with Sudafed; none taken today.  Patient denies fever, chills, rash, shortness of breath, vomiting, diarrhea, or other symptoms.  Her medical history includes chronic sinusitis, hypertension, morbid obesity, gastric bypass.   The history is provided by the patient and medical records.    Past Medical History:  Diagnosis Date   Anemia    has required 2 units PRBC   Anxiety    B12 deficiency    Back pain    Bipolar depression (HCC)    Chest pain    Chicken pox    Depression    trazadone has not helped in the past    Fatty liver    Headache    High cholesterol    History of blood transfusion 2009   [place]   Hypertension    MRSA (methicillin resistant Staphylococcus aureus)    Shortness of breath on exertion    Sinusitis    Sinusitis, chronic    Sleep apnea    UTI (urinary tract infection)    Vitamin D deficiency     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   FH: heart disease 03/05/2021   Atypical chest pain 03/05/2021   Hepatomegaly 03/05/2021   OSA (obstructive sleep apnea) 02/02/2021   Sinus tachycardia 05/28/2020   S/P foot surgery, left 05/28/2020   Fatty liver 02/07/2020   Morbid obesity with BMI of 40.0-44.9, adult ([place]) 01/24/2020   Sinus tarsi syndrome of left ankle 01/24/2020   Prediabetes 07/25/2019   Elevated liver enzymes 07/25/2019   Allergic rhinitis 08/11/2018    Chronic sinusitis 08/11/2018   Vitamin D deficiency 04/11/2018   Abnormal mammogram of left breast 04/11/2018   Plantar fasciitis 01/09/2018   Essential hypertension 12/18/2017   Anxiety and depression 12/18/2017   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   Anemia 12/15/2017   Pelvic pain 12/15/2017   Breast pain 12/15/2017   Chronic pain of left ankle 12/15/2017   Morbid obesity ([place]) 12/15/2017   Annual physical exam 01/02/2014   Vaginal discharge 01/02/2014   Fatigue 12/21/2013   Hot flashes 12/21/2013   Sleep disturbance 12/21/2013    Past Surgical History:  Procedure Laterality Date   ANKLE ARTHROSCOPY Left 05/09/2020   Procedure: ANKLE ARTHROSCOPY OCD REPAIR LEFT;  Surgeon: Samara Deist, DPM;  Location: [facility] ORS;  Service: Podiatry;  Laterality: Left;   CESAREAN SECTION     x3 [place]   COLONOSCOPY WITH PROPOFOL N/A 05/01/2019   Procedure: COLONOSCOPY WITH PROPOFOL;  Surgeon: Lucilla Lame, MD;  Location: [facility] ENDOSCOPY;  Service: Endoscopy;  Laterality: N/A;   ESOPHAGOGASTRODUODENOSCOPY (EGD) WITH PROPOFOL N/A 05/01/2019   Procedure: ESOPHAGOGASTRODUODENOSCOPY (EGD) WITH PROPOFOL;  Surgeon: Lucilla Lame, MD;  Location: [facility] ENDOSCOPY;  Service: Endoscopy;  Laterality: N/A;   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 in [facility]  GRAFT APPLICATION Left 0000000   Procedure: GRAFT APPLICATION - CALCANEAL AUTOGRAFT;  Surgeon: Samara Deist, DPM;  Location: [facility] ORS;  Service: Podiatry;  Laterality: Left;   NASAL SINUS SURGERY     2009/2010   ORIF CALCANEOUS FRACTURE Left 12/04/2021   Procedure: [place];  Surgeon: Samara Deist, DPM;  Location: [facility] ORS;  Service: Podiatry;  Laterality: Left;   OSTECTOMY Left 05/09/2020   Procedure: EVANS/MCDO LEFT;  Surgeon: Samara Deist, DPM;  Location: [facility] ORS;  Service: Podiatry;  Laterality: Left;   TENDON TRANSFER Left 05/09/2020   Procedure: FDL TRANSFER;DEEP  LEFT;  Surgeon: Samara Deist, DPM;  Location: [facility] ORS;  Service: Podiatry;  Laterality: Left;   TUBAL LIGATION     uterine ablation  2010   WOUND DEBRIDEMENT Left 05/09/2020   Procedure: A-SCOPE/DEBRIDEMENT/EXTENSIVE LEFT;  Surgeon: Samara Deist, DPM;  Location: [facility] ORS;  Service: Podiatry;  Laterality: Left;    OB History     Gravida  4   Para  3   Term  3   Preterm      AB  1   Living  3      SAB  0   IAB  1   Ectopic      Multiple      Live Births  3            Home Medications    Prior to Admission medications   Medication Sig Start Date End Date Taking? Authorizing Provider  cefdinir (OMNICEF) 300 MG capsule Take 1 capsule (300 mg total) by mouth 2 (two) times daily for 7 days. 12/20/22 12/27/22 Yes Sharion Balloon, NP  amLODipine (NORVASC) 5 MG tablet Take 1 tablet (5 mg total) by mouth daily. 11/22/22   Kennyth Arnold, FNP  cetirizine (ZYRTEC) 10 MG tablet Take 10 mg by mouth at bedtime.    [provider]  cyanocobalamin (,VITAMIN B-12,) 1000 MCG/ML injection inject B12 1x per week x 1 month then every 30 days Patient not taking: Reported on 12/20/2022 03/20/21   McLean-Scocuzza, Nino Glow, MD  diphenhydrAMINE (BENADRYL) 25 MG tablet Take 25 mg by mouth daily as needed for allergies.     [provider]  EPINEPHrine 0.3 mg/0.3 mL IJ SOAJ injection Inject 0.3 mg into the muscle as needed for anaphylaxis. 08/02/21   Delman Kitten, MD  HYDROcodone-acetaminophen (NORCO) 5-325 MG tablet Take 1 tablet by mouth every 6 (six) hours as needed for moderate pain. 12/04/21   Samara Deist, DPM  hydrocortisone 2.5 % lotion Apply topically 2 (two) times daily. Neck rash 09/09/21   McLean-Scocuzza, Nino Glow, MD  LORazepam (ATIVAN) 0.5 MG tablet Take 1 tablet (0.5 mg total) by mouth daily as needed for anxiety. 12/02/22   Kennyth Arnold, FNP  meloxicam (MOBIC) 15 MG tablet Take 15 mg by mouth daily. 08/05/21   [provider]   olmesartan-hydrochlorothiazide (BENICAR HCT) 20-12.5 MG tablet Take 1 tablet by mouth once daily 07/14/22   McLean-Scocuzza, Nino Glow, MD  ondansetron (ZOFRAN) 4 MG tablet TAKE 1 TABLET BY MOUTH EVERY 8 HOURS AS NEEDED FOR NAUSEA FOR VOMITING 06/16/22   McLean-Scocuzza, Nino Glow, MD  SYRINGE-NEEDLE, DISP, 3 ML 25G X 1" 3 ML MISC 1 Device by Does not apply route every 30 (thirty) days. Q30 days 09/09/21   McLean-Scocuzza, Nino Glow, MD  tiZANidine (ZANAFLEX) 4 MG capsule Take 1 capsule (4 mg total) by mouth at bedtime as needed for muscle spasms. 11/20/20  McLean-Scocuzza, Nino Glow, MD  venlafaxine XR (EFFEXOR-XR) 150 MG 24 hr capsule Take 1 capsule (150 mg total) by mouth daily with breakfast. 12/02/22   Dutch Quint B, FNP  VITAMIN D, CHOLECALCIFEROL, PO Take 2 tablets by mouth daily.    [provider]    Family History Family History  Problem Relation Age of Onset   Obesity Mother    Sleep apnea Mother    Thyroid disease Mother    Hyperlipidemia Mother    Cancer Mother 79       lung cancer-small cell lung cancer    Heart disease Mother        MI with stents   Hypertension Mother    Diabetes Mother    COPD Mother    Hypertension Father    Hyperlipidemia Father    Cancer Maternal Grandmother        brain cancer    Alcohol abuse Maternal Grandfather    Heart disease Maternal Grandfather    Arthritis Paternal Grandmother    Cancer Paternal Grandmother        leukemia    Hypertension Paternal Grandmother    Breast cancer Paternal Grandmother    Heart disease Paternal Grandfather    Hypertension Paternal Grandfather    Autism spectrum disorder Daughter    Mental illness Son    Cancer Maternal Aunt        breast cancer   Breast cancer Maternal Aunt    Alcohol abuse Maternal Aunt        Liver Failure   Cancer Maternal Uncle        colon cancer    Social History Social History   Tobacco Use   Smoking status: Never   Smokeless tobacco: Never  Vaping Use   Vaping Use:  Never used  Substance Use Topics   Alcohol use: Yes    Comment: 2 DRINK WEEKLY   Drug use: Not Currently    Types: Marijuana    Comment: 17 YEARS AGO     Allergies   Other, Penicillins, Percocet [oxycodone-acetaminophen], Ambien [zolpidem], Lipitor [atorvastatin], Molds & smuts, Aspirin, and Latex   Review of Systems Review of Systems  Constitutional:  Negative for chills and fever.  HENT:  Positive for congestion, nosebleeds, postnasal drip, sinus pressure, sinus pain and voice change. Negative for ear pain and sore throat.   Respiratory:  Positive for cough. Negative for shortness of breath.   Cardiovascular:  Negative for chest pain and palpitations.  Gastrointestinal:  Negative for diarrhea and vomiting.  Skin:  Negative for color change and rash.  All other systems reviewed and are negative.    Physical Exam Triage Vital Signs ED Triage Vitals  Enc Vitals Group     BP 12/20/22 0852 130/89     Pulse Rate 12/20/22 0849 (!) 108     Resp 12/20/22 0849 18     Temp 12/20/22 0849 97.8 F (36.6 C)     Temp src --      SpO2 12/20/22 0849 96 %     Weight --      Height --      Head Circumference --      Peak Flow --      Pain Score 12/20/22 0850 0     Pain Loc --      Pain Edu? --      Excl. in [place]? --    No data found.  Updated Vital Signs BP 130/89   Pulse (!) 108  Temp 97.8 F (36.6 C)   Resp 18   LMP  (LMP Unknown)   SpO2 96%   Visual Acuity Right Eye Distance:   Left Eye Distance:   Bilateral Distance:    Right Eye Near:   Left Eye Near:    Bilateral Near:     Physical Exam Vitals and nursing note reviewed.  Constitutional:      General: She is not in acute distress.    Appearance: She is well-developed. She is not ill-appearing.  HENT:     Right Ear: Tympanic membrane normal.     Left Ear: Tympanic membrane normal.     Nose: Congestion present.     Mouth/Throat:     Mouth: Mucous membranes are moist.     Pharynx: Oropharynx is clear.      Comments: Hoarse voice. Cardiovascular:     Rate and Rhythm: Normal rate and regular rhythm.     Heart sounds: Normal heart sounds.  Pulmonary:     Effort: Pulmonary effort is normal. No respiratory distress.     Breath sounds: Normal breath sounds.  Musculoskeletal:     Cervical back: Neck supple.  Skin:    General: Skin is warm and dry.  Neurological:     Mental Status: She is alert.  Psychiatric:        Mood and Affect: Mood normal.        Behavior: Behavior normal.      UC Treatments / Results  Labs (all labs ordered are listed, but only abnormal results are displayed) Labs Reviewed - No data to display  EKG   Radiology No results found.  Procedures Procedures (including critical care time)  Medications Ordered in UC Medications - No data to display  Initial Impression / Assessment and Plan / UC Course  I have reviewed the triage vital signs and the nursing notes.  Pertinent labs & imaging results that were available during my care of the patient were reviewed by me and considered in my medical decision making (see chart for details).    Acute sinusitis.  Treating with cefdinir.  Education provided on sinus infection.  Discussed symptomatic treatment including Flonase nasal spray.  Instructed patient to follow up with her PCP if her symptoms are not improving.  She agrees to plan of care.    Final Clinical Impressions(s) / UC Diagnoses   Final diagnoses:  Acute non-recurrent maxillary sinusitis     Discharge Instructions      Take the cefdinir as directed.  Follow up with your primary care provider if your symptoms are not improving.        ED Prescriptions     Medication Sig Dispense Auth. Provider   cefdinir (OMNICEF) 300 MG capsule Take 1 capsule (300 mg total) by mouth 2 (two) times daily for 7 days. 14 capsule Sharion Balloon, NP      PDMP not reviewed this encounter.   Sharion Balloon, NP 12/20/22 [phone]

## 2022-12-20 NOTE — Discharge Instructions (Addendum)
Take the cefdinir as directed.  Follow up with your primary care provider if your symptoms are not improving.

## 2022-12-20 NOTE — ED Triage Notes (Signed)
Pt presents to uc with co of sinus congestion, ha, dry nose and nose bleeds, with post nasal drip for 2 weeks, has been taking otc sudafed for symptoms. No fevers noted.

## 2022-12-21 NOTE — Progress Notes (Deleted)
   SUBJECTIVE:  No chief complaint on file.  HPI ***  PERTINENT PMH / PSH: ***  OBJECTIVE:  LMP  (LMP Unknown)    Physical Exam  ASSESSMENT/PLAN:  There are no diagnoses linked to this encounter. PDMP reviewed***  No follow-ups on file.  Carollee Leitz, MD

## 2022-12-22 ENCOUNTER — Encounter: Payer: 59 | Admitting: Family Medicine

## 2023-01-26 NOTE — Progress Notes (Unsigned)
Bethanie DickerKacy Jesua Tamblyn, Valerie Morales Phone: [phone]  Valerie Morales is a 47 y.o. female who presents today for transfer of care and congestion.   Patient was treated at Urgent Care on 12/20/2022 for sinusitis. She completed a 7 day course of antibiotics. She continues to have some congestion and feeling of ear fullness.  Respiratory illness:  Cough- Occasionally  Congestion-    Sinus- Yes, nasal   Chest- No  Post nasal drip- No  Sore throat- No  Shortness of breath- No  Fever- No  Fatigue/Myalgia- Yes Headache- No Nausea/Vomiting- No Taste disturbance- No  Smell disturbance- No  Covid exposure- No  Covid vaccination- x 2  Flu vaccination- Declined  Medications- Nasal spray, Zyrtec PRN  HYPERTENSION Disease Monitoring Home BP Monitoring- Not checking Chest pain- No    Dyspnea- No Medications Compliance-  Norvasc, Olmesartan/HCTZ. Lightheadedness-  No  Edema- No BMET    Component Value Date/Time   NA 137 10/13/2021 0748   NA 141 12/16/2017 0942   NA 136 07/06/2013 1029   K 3.9 10/13/2021 0748   K 4.5 07/06/2013 1029   CL 99 10/13/2021 0748   CL 107 07/06/2013 1029   CO2 27 10/13/2021 0748   CO2 22 07/06/2013 1029   GLUCOSE 85 10/13/2021 0748   GLUCOSE 83 07/06/2013 1029   BUN 7 10/13/2021 0748   BUN 8 12/16/2017 0942   BUN 8 07/06/2013 1029   CREATININE 0.70 10/13/2021 0748   CREATININE 0.39 (L) 07/06/2013 1029   CALCIUM 8.7 10/13/2021 0748   CALCIUM 8.7 07/06/2013 1029   GFRNONAA >60 01/22/2021 0725   GFRNONAA >60 07/06/2013 1029   GFRAA 125 12/16/2017 0942   GFRAA >60 07/06/2013 1029   HYPERLIPIDEMIA Symptoms Chest pain on exertion:  No   Leg claudication:   No Medications: Compliance- Diet controlled Right upper quadrant pain- No  Muscle aches- No Lipid Panel     Component Value Date/Time   CHOL 202 (H) 10/13/2021 0748   CHOL 212 (H) 12/16/2017 0942   TRIG 192.0 (H) 10/13/2021 0748   HDL 94.40 10/13/2021 0748   HDL 49 12/16/2017 0942   CHOLHDL 2  10/13/2021 0748   VLDL 38.4 10/13/2021 0748   LDLCALC 69 10/13/2021 0748   LDLCALC 140 (H) 12/16/2017 0942   LABVLDL 23 12/16/2017 0942   Anxiety/Depression- Patient reports worsening depression symptoms. She is requesting to increase her Effexor. She is currently on Effexor XR 150 mg daily and Ativan 0.5 mg daily PRN. PHQ- 9 and GAD- 4. Denies SI/HI. She is open to seeing Psychiatry, she has seen them in the past. She is not currently seeing a therapist/counselor.   Social History   Tobacco Use  Smoking Status Never  Smokeless Tobacco Never    Current Outpatient Medications on File Prior to Visit  Medication Sig Dispense Refill   amLODipine (NORVASC) 5 MG tablet Take 1 tablet (5 mg total) by mouth daily. 30 tablet 0   cetirizine (ZYRTEC) 10 MG tablet Take 10 mg by mouth at bedtime.     diphenhydrAMINE (BENADRYL) 25 MG tablet Take 25 mg by mouth daily as needed for allergies.      EPINEPHrine 0.3 mg/0.3 mL IJ SOAJ injection Inject 0.3 mg into the muscle as needed for anaphylaxis. 1 each 1   LORazepam (ATIVAN) 0.5 MG tablet Take 1 tablet (0.5 mg total) by mouth daily as needed for anxiety. 30 tablet 2   olmesartan-hydrochlorothiazide (BENICAR HCT) 20-12.5 MG tablet Take 1 tablet by mouth once daily 90 tablet  2   venlafaxine XR (EFFEXOR-XR) 150 MG 24 hr capsule Take 1 capsule (150 mg total) by mouth daily with breakfast. 90 capsule 3   No current facility-administered medications on file prior to visit.    ROS see history of present illness  Objective  Physical Exam Vitals:   01/27/23 1402  BP: 118/72  Pulse: (!) 120  Temp: 97.8 F (36.6 C)  SpO2: 98%    BP Readings from Last 3 Encounters:  01/27/23 118/72  12/20/22 130/89  12/04/21 129/86   Wt Readings from Last 3 Encounters:  01/27/23 212 lb 9.6 oz (96.4 kg)  12/04/21 219 lb (99.3 kg)  11/26/21 253 lb (114.8 kg)    Physical Exam Constitutional:      General: She is not in acute distress.    Appearance: Normal  appearance.  HENT:     Head: Normocephalic.     Right Ear: Tympanic membrane normal.     Left Ear: Tympanic membrane normal.     Nose: Nose normal.     Mouth/Throat:     Mouth: Mucous membranes are moist.     Pharynx: Oropharynx is clear.  Eyes:     Conjunctiva/sclera: Conjunctivae normal.     Pupils: Pupils are equal, round, and reactive to light.  Cardiovascular:     Rate and Rhythm: Normal rate and regular rhythm.     Heart sounds: Normal heart sounds.  Pulmonary:     Effort: Pulmonary effort is normal.     Breath sounds: Normal breath sounds.  Abdominal:     General: Abdomen is flat. Bowel sounds are normal.     Palpations: Abdomen is soft. There is no mass.     Tenderness: There is no abdominal tenderness.  Lymphadenopathy:     Cervical: No cervical adenopathy.  Skin:    General: Skin is warm and dry.  Neurological:     General: No focal deficit present.     Mental Status: She is alert.  Psychiatric:        Mood and Affect: Mood normal.        Behavior: Behavior normal.    Assessment/Plan: Please see individual problem list.  Allergic rhinitis, unspecified seasonality, unspecified trigger Assessment & Plan: Advised to take Zyrtec daily and continue nasal spray as needed. She can add plain Mucinex twice daily to help with congestion. Encouraged adequate fluid intake. Return precautions given to patient.    Anxiety and depression Assessment & Plan: Chronic. Worsening depression symptoms. Will add Effexor XR 37.5 mg in addition to her Effexor XR 150 mg daily to total 187.5 mg daily. Denies SI/HI. Will refer to Psychiatry. She will continue Ativan 0.5 mg daily PRN, refills not needed at this time. Encouraged to contact if worsening symptoms, unusual behavior changes or suicidal thoughts occur.   Orders: -     Venlafaxine HCl ER; Take 1 capsule (37.5 mg total) by mouth daily with breakfast. Take with 150 mg capsule to total 187.5 mg daily.  Dispense: 90 capsule; Refill:  3 -     Ambulatory referral to Psychiatry  Essential hypertension Assessment & Plan: Chronic. Stable on Norvasc and Olmesartan/HCTZ daily. Continue.    Hyperlipidemia, unspecified hyperlipidemia type Assessment & Plan: Chronic. Stable with diet control. Continue. Will check lipids at next appointment. The 10-year ASCVD risk score (Arnett DK, et al., 2019) is: 0.8%. Encouraged healthy diet and exercise.    Nausea -     Ondansetron HCl; TAKE 1 TABLET BY MOUTH EVERY 8 HOURS AS  NEEDED FOR NAUSEA FOR VOMITING  Dispense: 40 tablet; Refill: 2   Return if symptoms worsen or fail to improve, for Annual Exam.   Bethanie Dicker, Valerie Morales [facility] - [employer]

## 2023-01-27 ENCOUNTER — Encounter: Payer: Self-pay | Admitting: Nurse Practitioner

## 2023-01-27 ENCOUNTER — Ambulatory Visit: Payer: 59 | Admitting: Nurse Practitioner

## 2023-01-27 VITALS — BP 118/72 | HR 120 | Temp 97.8°F | Ht 63.0 in | Wt 212.6 lb

## 2023-01-27 DIAGNOSIS — F32A Depression, unspecified: Secondary | ICD-10-CM

## 2023-01-27 DIAGNOSIS — J309 Allergic rhinitis, unspecified: Secondary | ICD-10-CM | POA: Diagnosis not present

## 2023-01-27 DIAGNOSIS — E559 Vitamin D deficiency, unspecified: Secondary | ICD-10-CM

## 2023-01-27 DIAGNOSIS — E785 Hyperlipidemia, unspecified: Secondary | ICD-10-CM | POA: Diagnosis not present

## 2023-01-27 DIAGNOSIS — I10 Essential (primary) hypertension: Secondary | ICD-10-CM

## 2023-01-27 DIAGNOSIS — Z1231 Encounter for screening mammogram for malignant neoplasm of breast: Secondary | ICD-10-CM

## 2023-01-27 DIAGNOSIS — K760 Fatty (change of) liver, not elsewhere classified: Secondary | ICD-10-CM

## 2023-01-27 DIAGNOSIS — F419 Anxiety disorder, unspecified: Secondary | ICD-10-CM

## 2023-01-27 DIAGNOSIS — G4733 Obstructive sleep apnea (adult) (pediatric): Secondary | ICD-10-CM

## 2023-01-27 DIAGNOSIS — E538 Deficiency of other specified B group vitamins: Secondary | ICD-10-CM

## 2023-01-27 DIAGNOSIS — R110 Nausea: Secondary | ICD-10-CM

## 2023-01-27 MED ORDER — VENLAFAXINE HCL ER 37.5 MG PO CP24
37.5000 mg | ORAL_CAPSULE | Freq: Every day | ORAL | 3 refills | Status: DC
Start: 2023-01-27 — End: 2023-09-14

## 2023-01-27 MED ORDER — ONDANSETRON HCL 4 MG PO TABS: ORAL_TABLET | ORAL | 2 refills | Status: DC

## 2023-01-27 NOTE — Assessment & Plan Note (Signed)
Chronic. Worsening depression symptoms. Will add Effexor XR 37.5 mg in addition to her Effexor XR 150 mg daily to total 187.5 mg daily. Denies SI/HI. Will refer to Psychiatry. She will continue Ativan 0.5 mg daily PRN, refills not needed at this time. Encouraged to contact if worsening symptoms, unusual behavior changes or suicidal thoughts occur.

## 2023-01-27 NOTE — Assessment & Plan Note (Signed)
Advised to take Zyrtec daily and continue nasal spray as needed. She can add plain Mucinex twice daily to help with congestion. Encouraged adequate fluid intake. Return precautions given to patient.

## 2023-01-27 NOTE — Assessment & Plan Note (Signed)
Chronic. Stable with diet control. Continue. Will check lipids at next appointment. The 10-year ASCVD risk score (Arnett DK, et al., 2019) is: 0.8%. Encouraged healthy diet and exercise.

## 2023-01-27 NOTE — Assessment & Plan Note (Signed)
Chronic. Stable on Norvasc and Olmesartan/HCTZ daily. Continue.

## 2023-02-16 ENCOUNTER — Other Ambulatory Visit: Payer: Self-pay

## 2023-02-16 ENCOUNTER — Encounter: Payer: Self-pay | Admitting: Emergency Medicine

## 2023-02-16 ENCOUNTER — Emergency Department
Admission: EM | Admit: 2023-02-16 | Discharge: 2023-02-16 | Disposition: A | Discharge status: Home / Self Care | Payer: 59 | Attending: Emergency Medicine | Admitting: Emergency Medicine

## 2023-02-16 ENCOUNTER — Emergency Department: Payer: 59

## 2023-02-16 DIAGNOSIS — S61451A Open bite of right hand, initial encounter: Secondary | ICD-10-CM | POA: Diagnosis not present

## 2023-02-16 DIAGNOSIS — I10 Essential (primary) hypertension: Secondary | ICD-10-CM | POA: Diagnosis not present

## 2023-02-16 DIAGNOSIS — Y92009 Unspecified place in unspecified non-institutional (private) residence as the place of occurrence of the external cause: Secondary | ICD-10-CM | POA: Diagnosis not present

## 2023-02-16 DIAGNOSIS — Z23 Encounter for immunization: Secondary | ICD-10-CM | POA: Insufficient documentation

## 2023-02-16 DIAGNOSIS — S6991XA Unspecified injury of right wrist, hand and finger(s), initial encounter: Secondary | ICD-10-CM | POA: Diagnosis present

## 2023-02-16 DIAGNOSIS — W540XXA Bitten by dog, initial encounter: Secondary | ICD-10-CM | POA: Insufficient documentation

## 2023-02-16 MED ORDER — SULFAMETHOXAZOLE-TRIMETHOPRIM 800-160 MG PO TABS: 1.0000 | ORAL_TABLET | Freq: Two times a day (BID) | ORAL | 0 refills | Status: DC

## 2023-02-16 MED ORDER — CLINDAMYCIN HCL 300 MG PO CAPS: 300.0000 mg | ORAL_CAPSULE | Freq: Three times a day (TID) | ORAL | 0 refills | Status: AC

## 2023-02-16 MED ORDER — TETANUS-DIPHTH-ACELL PERTUSSIS 5-2.5-18.5 LF-MCG/0.5 IM SUSY
0.5000 mL | PREFILLED_SYRINGE | Freq: Once | INTRAMUSCULAR | Status: AC
  Administered 2023-02-16: 0.5 mL via INTRAMUSCULAR
  Filled 2023-02-16: qty 0.5

## 2023-02-16 NOTE — ED Triage Notes (Signed)
Pt via POV from home. Pt has an animal bite to R hand. States that it was her dog and the shots are not up to date. Pt states the bite happened last night. Pt has not contacted animal control yet. Unknown last tetanus. Pt is A&Ox4 and NAD

## 2023-02-16 NOTE — Discharge Instructions (Signed)
Follow-up with your primary care provider if any continued problems.  Return to the emergency department if any severe worsening of your symptoms or suspicion of infection in your right hand.  Clean the area daily with mild soap and water.  Begin taking antibiotics until completely finished.  As we discussed since you are allergic to penicillin will be 2 antibiotics sent to the pharmacy for this.  You may take Tylenol or ibuprofen as needed for pain and discomfort.  Also elevate your hand which will also help reduce pain.  So have your primary care provider check your blood pressures your blood pressure was elevated in the emergency department which may be due to the situation.

## 2023-02-16 NOTE — ED Provider Notes (Signed)
[facility] Provider Note    Event Date/Time   First MD Initiated Contact with Patient 02/16/23 [phone]     (approximate)   History   Animal Bite   HPI  Cassandra CHERIKA JESSIE is a 47 y.o. female presents to the ED with dog bite to her right hand.  Patient states that she was placing her son's dog and its kennel and was taking something out of the dog's mouth when the dog bit her.  She found out from her son that the dog is not up-to-date on rabies immunizations.  This was not reported to animal control.  Patient has a history of hypertension, vitamin D deficiency, bipolar depression, anxiety, anemia.     Physical Exam   Triage Vital Signs: ED Triage Vitals  Enc Vitals Group     BP 02/16/23 0837 (!) 139/98     Pulse Rate 02/16/23 0837 96     Resp 02/16/23 0837 18     Temp 02/16/23 0837 98.6 F (37 C)     Temp src --      SpO2 02/16/23 0837 100 %     Weight 02/16/23 0835 216 lb (98 kg)     Height 02/16/23 0835 5\' 3"  (1.6 m)     Head Circumference --      Peak Flow --      Pain Score --      Pain Loc --      Pain Edu? --      Excl. in GC? --     Most recent vital signs: Vitals:   02/16/23 0837 02/16/23 0937  BP: (!) 139/98 119/89  Pulse: 96 95  Resp: 18 18  Temp: 98.6 F (37 C)   SpO2: 100% 100%     General: Awake, no distress.  CV:  Good peripheral perfusion.  Resp:  Normal effort.  Abd:  No distention.  Other:  Volar aspect of the right hand there is a 2 cm superficial appearing laceration without active bleeding to the palm of the hand.  Patient is able to move all digits without any difficulty and motor or sensory function intact.  No foreign body noted on visual inspection.   ED Results / Procedures / Treatments   Labs (all labs ordered are listed, but only abnormal results are displayed) Labs Reviewed - No data to display    RADIOLOGY Right hand x-ray images were reviewed and interpreted by myself independent of the  radiologist and no foreign body or bony abnormality is noted.    PROCEDURES:  Critical Care performed:   Procedures   MEDICATIONS ORDERED IN ED: Medications  Tdap (BOOSTRIX) injection 0.5 mL (0.5 mLs Intramuscular Given 02/16/23 0936)     IMPRESSION / MDM / ASSESSMENT AND PLAN / ED COURSE  I reviewed the triage vital signs and the nursing notes.   Differential diagnosis includes, but is not limited to, dog bite right hand, infection, foreign body, fracture, contusion, tendon injury.  47 year old female presents to the ED with complaint of a dog bite that occurred last evening.  Patient has a small injury to her right hand and reports that the dog is not up-to-date on rabies immunizations.  Animal control was contacted and has talked to the patient while in the ED.  Area was cleaned and dressed.  Patient was given care instructions and also to watch for any signs of infection.  Because she is allergic to penicillins she was placed on clindamycin and Bactrim DS  for prophylaxis along with updating her tetanus.  She was given strict return precautions if any signs of infection or also if the animal control people feel that she needs to have rabies prophylaxis.      Patient's presentation is most consistent with acute complicated illness / injury requiring diagnostic workup.  FINAL CLINICAL IMPRESSION(S) / ED DIAGNOSES   Final diagnoses:  Dog bite of right hand, initial encounter     Rx / DC Orders   ED Discharge Orders          Ordered    clindamycin (CLEOCIN) 300 MG capsule  3 times daily        02/16/23 0930    sulfamethoxazole-trimethoprim (BACTRIM DS) 800-160 MG tablet  2 times daily        02/16/23 0930             Note:  This document was prepared using Dragon voice recognition software and may include unintentional dictation errors.   Tommi Rumps, PA-C 02/16/23 1346    Jene Every, MD 02/16/23 [phone]

## 2023-02-18 ENCOUNTER — Telehealth: Payer: Self-pay

## 2023-02-18 NOTE — Transitions of Care (Post Inpatient/ED Visit) (Signed)
Unable to reach pt by phone and left v/m requesting cb [phone].       02/18/2023  Name: Valerie Morales MRN: [id number] DOB: 25-Jun-1976  Today's TOC FU Call Status: Today's TOC FU Call Status:: Unsuccessul Call (1st Attempt) Unsuccessful Call (1st Attempt) Date: 02/18/23  Attempted to reach the patient regarding the most recent Inpatient/ED visit.  Follow Up Plan: Additional outreach attempts will be made to reach the patient to complete the Transitions of Care (Post Inpatient/ED visit) call.   Signature Lewanda Rife, LPN

## 2023-02-21 NOTE — Transitions of Care (Post Inpatient/ED Visit) (Signed)
Unable to reach pt by phone and left v/m requesting pt to cb [phone].      02/21/2023  Name: Valerie Morales MRN: [id number] DOB: [birth year]  Today's TOC FU Call Status: Today's TOC FU Call Status:: Unsuccessful Call (2nd Attempt) Unsuccessful Call (1st Attempt) Date: 02/21/23  Attempted to reach the patient regarding the most recent Inpatient/ED visit.  Follow Up Plan: Additional outreach attempts will be made to reach the patient to complete the Transitions of Care (Post Inpatient/ED visit) call.   Signature Lewanda Rife, LPN

## 2023-03-15 ENCOUNTER — Encounter: Payer: 59 | Admitting: Nurse Practitioner

## 2023-04-21 ENCOUNTER — Emergency Department
Admission: EM | Admit: 2023-04-21 | Discharge: 2023-04-21 | Disposition: A | Discharge status: Home / Self Care | Payer: 59 | Attending: Emergency Medicine | Admitting: Emergency Medicine

## 2023-04-21 ENCOUNTER — Other Ambulatory Visit: Payer: Self-pay

## 2023-04-21 DIAGNOSIS — T63441A Toxic effect of venom of bees, accidental (unintentional), initial encounter: Secondary | ICD-10-CM | POA: Diagnosis present

## 2023-04-21 DIAGNOSIS — T63461A Toxic effect of venom of wasps, accidental (unintentional), initial encounter: Secondary | ICD-10-CM

## 2023-04-21 MED ORDER — DIPHENHYDRAMINE HCL 50 MG/ML IJ SOLN
25.0000 mg | Freq: Once | INTRAMUSCULAR | Status: AC
  Administered 2023-04-21: 25 mg via INTRAVENOUS
  Filled 2023-04-21: qty 1

## 2023-04-21 MED ORDER — FAMOTIDINE IN NACL 20-0.9 MG/50ML-% IV SOLN
20.0000 mg | Freq: Once | INTRAVENOUS | Status: AC
  Administered 2023-04-21: 20 mg via INTRAVENOUS
  Filled 2023-04-21: qty 50

## 2023-04-21 MED ORDER — SODIUM CHLORIDE 0.9 % IV BOLUS
1000.0000 mL | Freq: Once | INTRAVENOUS | Status: AC
  Administered 2023-04-21: 1000 mL via INTRAVENOUS

## 2023-04-21 MED ORDER — METHYLPREDNISOLONE SODIUM SUCC 125 MG IJ SOLR
125.0000 mg | Freq: Once | INTRAMUSCULAR | Status: AC
  Administered 2023-04-21: 125 mg via INTRAVENOUS
  Filled 2023-04-21: qty 2

## 2023-04-21 MED ORDER — HYDROCODONE-ACETAMINOPHEN 5-325 MG PO TABS
1.0000 | ORAL_TABLET | Freq: Once | ORAL | Status: AC
  Administered 2023-04-21: 1 via ORAL
  Filled 2023-04-21: qty 1

## 2023-04-21 MED ORDER — KETOROLAC TROMETHAMINE 15 MG/ML IJ SOLN
15.0000 mg | Freq: Once | INTRAMUSCULAR | Status: AC
  Administered 2023-04-21: 15 mg via INTRAVENOUS
  Filled 2023-04-21: qty 1

## 2023-04-21 MED ORDER — HYDROCODONE-ACETAMINOPHEN 5-325 MG PO TABS: 1.0000 | ORAL_TABLET | Freq: Four times a day (QID) | ORAL | 0 refills | Status: AC | PRN

## 2023-04-21 NOTE — Discharge Instructions (Addendum)
You were seen in the emergency department today for multiple stings.  You can take Tylenol and ibuprofen to help with pain.  If you have breakthrough pain, I sent a short course of narcotic pain medicine to your pharmacy that you can take as needed.  This does make you drowsy, do not drive or operate machinery.  You can take Benadryl as needed to help with itching.  Return to the ER for new or worsening symptoms.

## 2023-04-21 NOTE — ED Notes (Signed)
This RN to bedside to reassess pt. Pt. States she is not less itchy, and chest pain remains the same. Will notify MD.

## 2023-04-21 NOTE — ED Triage Notes (Signed)
Pt to ED for multiple stings by yellow jackets today while pouring gasoline into nest in hole in ground. Pt cannot state how many stings or where at. Becoming irritated when asking questions in triage.  Received epi pen PTA.

## 2023-04-21 NOTE — ED Provider Notes (Signed)
[facility] Provider Note    Event Date/Time   First MD Initiated Contact with Patient 04/21/23 1025     (approximate)   History   No chief complaint on file.   HPI  Valerie Morales is a 47 y.o. female with history of multiple allergies including bee stings presenting to the emergency department for evaluation of multiple yellowjacket bites.  Patient noticed a nest in a hole in the ground.  She went to pour gasoline on it, but multiple yellowjacket came up and stung her over her body.  No facial stings, does report stings over her breast, legs.  She immediately gave herself an EpiPen but is unsure if it penetrated the skin.  Denies any throat swelling, noted hives, GI symptoms, fever.       Physical Exam   Triage Vital Signs: ED Triage Vitals  Enc Vitals Group     BP 04/21/23 1021 (!) 157/109     Pulse Rate 04/21/23 1019 (!) 130     Resp 04/21/23 1019 20     Temp 04/21/23 1019 98.1 F (36.7 C)     Temp src --      SpO2 04/21/23 1019 95 %     Weight 04/21/23 1019 219 lb (99.3 kg)     Height 04/21/23 1019 5\' 3"  (1.6 m)     Head Circumference --      Peak Flow --      Pain Score 04/21/23 1019 8     Pain Loc --      Pain Edu? --      Excl. in GC? --     Most recent vital signs: Vitals:   04/21/23 1200 04/21/23 1340  BP: 116/80 122/85  Pulse: 96 98  Resp: 15 20  Temp:    SpO2: 100% 100%     General: Awake, interactive  CV:  Regular rate, good peripheral perfusion.  HEENT:  No facial or airway swelling Resp:  Lungs clear, unlabored respirations.  Abd:  Soft, nondistended.  Neuro:  Symmetric facial movement, fluid speech Skin:  There are scattered erythematous patches over the body consistent with sting marks.  No clear urticaria noted.   ED Results / Procedures / Treatments   Labs (all labs ordered are listed, but only abnormal results are displayed) Labs Reviewed - No data to display   EKG EKG independently reviewed  interpreted by myself (ER attending) demonstrates:  EKG demonstrates sinus rhythm at a rate 95, PR 166, QRS 79, QTc 443, no acute ST changes  RADIOLOGY Imaging independently reviewed and interpreted by myself demonstrates:    PROCEDURES:  Critical Care performed: No  Procedures   MEDICATIONS ORDERED IN ED: Medications  ketorolac (TORADOL) 15 MG/ML injection 15 mg (has no administration in time range)  diphenhydrAMINE (BENADRYL) injection 25 mg (25 mg Intravenous Given 04/21/23 1106)  methylPREDNISolone sodium succinate (SOLU-MEDROL) 125 mg/2 mL injection 125 mg (125 mg Intravenous Given 04/21/23 1106)  famotidine (PEPCID) IVPB 20 mg premix (0 mg Intravenous Stopped 04/21/23 1223)  sodium chloride 0.9 % bolus 1,000 mL (0 mLs Intravenous Stopped 04/21/23 1224)  HYDROcodone-acetaminophen (NORCO/VICODIN) 5-325 MG per tablet 1 tablet (1 tablet Oral Given 04/21/23 1111)  diphenhydrAMINE (BENADRYL) injection 25 mg (25 mg Intravenous Given 04/21/23 1201)     IMPRESSION / MDM / ASSESSMENT AND PLAN / ED COURSE  I reviewed the triage vital signs and the nursing notes.  Differential diagnosis includes, but is not limited to, localized reaction in the  setting of staying, allergic reaction, consideration for anaphylaxis Mast after receiving EpiPen, though no clinical signs of this currently  Patient's presentation is most consistent with acute presentation with potential threat to life or bodily function.  47 year old female presenting to the emergency department for evaluation after being stung multiple times by yellow jackets.  Administered her EpiPen prior to presentation here though she if she received the medication.  Regardless, we will plan for prolonged observation.  She was ordered for Benadryl, steroids, famotidine, Norco for pain control.  No indication for lab work.  While here, patient did complain of some tightness over her right chest.  On exam, this is consistent with location of a bite mark.   EKG reassuring.  I discussed lab work, but patient declined this which I do think is reasonable.  On serial reevaluations, patient with out worsening or recurrent symptoms.  After 4 hours from administration of her EpiPen, patient was reevaluated.  She does report some ongoing pain at the location of her multiple bite marks, but otherwise feels comfortable with discharge home.  Do think she is stable for this.  Strict return precautions provided.    FINAL CLINICAL IMPRESSION(S) / ED DIAGNOSES   Final diagnoses:  Yellow jacket sting, accidental or unintentional, initial encounter     Rx / DC Orders   ED Discharge Orders          Ordered    HYDROcodone-acetaminophen (NORCO) 5-325 MG tablet  Every 6 hours PRN        04/21/23 1401             Note:  This document was prepared using Dragon voice recognition software and may include unintentional dictation errors.   Trinna Post, MD 04/21/23 [phone]

## 2023-04-22 ENCOUNTER — Telehealth: Payer: Self-pay

## 2023-04-22 NOTE — Transitions of Care (Post Inpatient/ED Visit) (Unsigned)
I spoke with pt; pt seen [facility] ED on 04/21/23 due to multiple yellow jacket stings all over body. Pt said today doing better but still itchy but no difficulty in breathing and no swelling in neck,throat, tongue or mouth. Pt does not need appt with PCP at this time but if pt needs appt she has office contact info. Sending note to Bethanie Dicker NP.      04/22/2023  Name: Valerie Morales MRN: [id number] DOB: 09/12/1976  Today's TOC FU Call Status: Today's TOC FU Call Status:: Successful TOC FU Call Competed TOC FU Call Complete Date: 04/22/23  Transition Care Management Follow-up Telephone Call Date of Discharge: 04/21/23 Discharge Facility: [facility]) Type of Discharge: Emergency Department Reason for ED Visit: Other: (multiple yellow jacket stings breast, stomach, arms and legs) How have you been since you were released from the hospital?: Better (still some itching but no difficulty in breathing or swelling at neck,throat,mouth or tongue.) Any questions or concerns?: No  Items Reviewed: Did you receive and understand the discharge instructions provided?: Yes Medications obtained,verified, and reconciled?: Yes (Medications Reviewed) Any new allergies since your discharge?: No Dietary orders reviewed?: NA Do you have support at home?: Yes People in Home: spouse Name of Support/Comfort Primary Source: Maurine Minister  Medications Reviewed Today: Medications Reviewed Today     Reviewed by Donavan Foil, CMA (Certified Medical Assistant) on 01/27/23 at 1405  Med List Status: <None>   Medication Order Taking? Sig Documenting Provider Last Dose Status Informant  amLODipine (NORVASC) 5 MG tablet [id number] Yes Take 1 tablet (5 mg total) by mouth daily. Worthy Rancher B, FNP Taking Active   cetirizine (ZYRTEC) 10 MG tablet [id number] Yes Take 10 mg by mouth at bedtime. [provider] Taking Active Self  cyanocobalamin (,VITAMIN B-12,) 1000 MCG/ML injection  [id number]  inject B12 1x per week x 1 month then every 30 days McLean-Scocuzza, Pasty Spillers, MD  Consider Medication Status and Discontinue (Patient Preference)   diphenhydrAMINE (BENADRYL) 25 MG tablet [id number] Yes Take 25 mg by mouth daily as needed for allergies.  [provider] Taking Active Self  EPINEPHrine 0.3 mg/0.3 mL IJ SOAJ injection [id number] Yes Inject 0.3 mg into the muscle as needed for anaphylaxis. Sharyn Creamer, MD Taking Active   gabapentin (NEURONTIN) 100 MG capsule [id number]  Take 1 capsule by mouth at bedtime. [provider]  Consider Medication Status and Discontinue (Patient Preference)   HYDROcodone-acetaminophen (NORCO) 5-325 MG tablet [id number]  Take 1 tablet by mouth every 6 (six) hours as needed for moderate pain. Gwyneth Revels, DPM  Active   hydrocortisone 2.5 % lotion [id number]  Apply topically 2 (two) times daily. Neck rash McLean-Scocuzza, Pasty Spillers, MD  Consider Medication Status and Discontinue (Patient Preference)   LORazepam (ATIVAN) 0.5 MG tablet [id number] Yes Take 1 tablet (0.5 mg total) by mouth daily as needed for anxiety. Worthy Rancher B, FNP Taking Active   meloxicam (MOBIC) 15 MG tablet [id number]  Take 15 mg by mouth daily. [provider]  Consider Medication Status and Discontinue (Patient Preference)   olmesartan-hydrochlorothiazide (BENICAR HCT) 20-12.5 MG tablet [id number] Yes Take 1 tablet by mouth once daily McLean-Scocuzza, Pasty Spillers, MD Taking Active   ondansetron (ZOFRAN) 4 MG tablet [id number] Yes TAKE 1 TABLET BY MOUTH EVERY 8 HOURS AS NEEDED FOR NAUSEA FOR VOMITING McLean-Scocuzza, Pasty Spillers, MD Taking Active   SYRINGE-NEEDLE, DISP, 3 ML 25G X 1" 3 ML MISC [id number]  1 Device by Does not apply route every  30 (thirty) days. Q30 days McLean-Scocuzza, Pasty Spillers, MD  Consider Medication Status and Discontinue (Patient Preference)   tiZANidine (ZANAFLEX) 4 MG capsule [id number]  Take 1 capsule (4 mg total) by mouth at bedtime as needed for  muscle spasms. McLean-Scocuzza, Pasty Spillers, MD  Consider Medication Status and Discontinue (Patient Preference)            Med Note Rubin Payor, Glenard Haring   Fri Nov 27, 2021  9:35 AM) No fill record within the past 12 months  traMADol (ULTRAM) 50 MG tablet [id number]  1-2 tabs every 6 hours as needed for pain [provider]  Consider Medication Status and Discontinue (Patient Preference)   venlafaxine XR (EFFEXOR-XR) 150 MG 24 hr capsule [id number] Yes Take 1 capsule (150 mg total) by mouth daily with breakfast. Eulis Foster, FNP Taking Active   VITAMIN D, CHOLECALCIFEROL, PO [id number]  Take 2 tablets by mouth daily. [provider]  Consider Medication Status and Discontinue (Patient Preference) Self            Home Care and Equipment/Supplies: Were Home Health Services Ordered?: NA Any new equipment or medical supplies ordered?: NA  Functional Questionnaire: Do you need assistance with bathing/showering or dressing?: No Do you need assistance with meal preparation?: No Do you need assistance with eating?: No Do you have difficulty maintaining continence: No Do you need assistance with getting out of bed/getting out of a chair/moving?: No Do you have difficulty managing or taking your medications?: No  Follow up appointments reviewed: PCP Follow-up appointment confirmed?: NA (pt said does not need FU and if appt needed pt has contact info for oiffice.) Specialist Hospital Follow-up appointment confirmed?: NA Do you need transportation to your follow-up appointment?: No Do you understand care options if your condition(s) worsen?: Yes-patient verbalized understanding    SIGNATURE Lewanda Rife, LPN

## 2023-05-16 ENCOUNTER — Encounter: Payer: Self-pay | Admitting: Emergency Medicine

## 2023-05-16 ENCOUNTER — Emergency Department: Payer: 59

## 2023-05-16 ENCOUNTER — Other Ambulatory Visit: Payer: Self-pay

## 2023-05-16 ENCOUNTER — Emergency Department
Admission: EM | Admit: 2023-05-16 | Discharge: 2023-05-16 | Disposition: A | Discharge status: Home / Self Care | Payer: 59 | Attending: Emergency Medicine | Admitting: Emergency Medicine

## 2023-05-16 DIAGNOSIS — N760 Acute vaginitis: Secondary | ICD-10-CM | POA: Insufficient documentation

## 2023-05-16 DIAGNOSIS — I10 Essential (primary) hypertension: Secondary | ICD-10-CM | POA: Insufficient documentation

## 2023-05-16 DIAGNOSIS — R102 Pelvic and perineal pain: Secondary | ICD-10-CM | POA: Diagnosis present

## 2023-05-16 LAB — COMPREHENSIVE METABOLIC PANEL
ALT: 32 U/L (ref 0–44)
AST: 65 U/L — ABNORMAL HIGH (ref 15–41)
Albumin: 3.9 g/dL (ref 3.5–5.0)
Alkaline Phosphatase: 109 U/L (ref 38–126)
Anion gap: 13 (ref 5–15)
BUN: 7 mg/dL (ref 6–20)
CO2: 25 mmol/L (ref 22–32)
Calcium: 9.1 mg/dL (ref 8.9–10.3)
Chloride: 99 mmol/L (ref 98–111)
Creatinine, Ser: 0.56 mg/dL (ref 0.44–1.00)
GFR, Estimated: >60 mL/min (ref >60)
Glucose, Bld: 94 mg/dL (ref 70–99)
Potassium: 3.5 mmol/L (ref 3.5–5.1)
Sodium: 137 mmol/L (ref 135–145)
Total Bilirubin: 0.4 mg/dL (ref 0.3–1.2)
Total Protein: 7.5 g/dL (ref 6.5–8.1)

## 2023-05-16 LAB — URINALYSIS, ROUTINE W REFLEX MICROSCOPIC
Bilirubin Urine: NEGATIVE
Glucose, UA: NEGATIVE mg/dL
Hgb urine dipstick: NEGATIVE
Ketones, ur: NEGATIVE mg/dL
Leukocytes,Ua: NEGATIVE
Nitrite: NEGATIVE
Protein, ur: NEGATIVE mg/dL
Specific Gravity, Urine: 1.006 (ref 1.005–1.030)
pH: 6 (ref 5.0–8.0)

## 2023-05-16 LAB — CBC
HCT: 37.2 % (ref 36.0–46.0)
Hemoglobin: 13 g/dL (ref 12.0–15.0)
MCH: 34 pg (ref 26.0–34.0)
MCHC: 34.9 g/dL (ref 30.0–36.0)
MCV: 97.4 fL (ref 80.0–100.0)
Platelets: 276 10*3/uL (ref 150–400)
RBC: 3.82 MIL/uL — ABNORMAL LOW (ref 3.87–5.11)
RDW: 14.1 % (ref 11.5–15.5)
WBC: 7.5 10*3/uL (ref 4.0–10.5)
nRBC: 0 % (ref 0.0–0.2)

## 2023-05-16 LAB — WET PREP, GENITAL
Sperm: NONE SEEN
Trich, Wet Prep: NONE SEEN
WBC, Wet Prep HPF POC: <10 (ref <10)
Yeast Wet Prep HPF POC: NONE SEEN

## 2023-05-16 LAB — CHLAMYDIA/NGC RT PCR (ARMC ONLY)
Chlamydia Tr: NOT DETECTED
N gonorrhoeae: NOT DETECTED

## 2023-05-16 LAB — POC URINE PREG, ED: Preg Test, Ur: NEGATIVE

## 2023-05-16 LAB — LIPASE, BLOOD: Lipase: 54 U/L — ABNORMAL HIGH (ref 11–51)

## 2023-05-16 MED ORDER — METRONIDAZOLE 500 MG PO TABS: 500.0000 mg | ORAL_TABLET | Freq: Two times a day (BID) | ORAL | 0 refills | Status: AC

## 2023-05-16 NOTE — ED Triage Notes (Signed)
Patient to ED via POV for pelvic pain x4 days. Denies burning with urination or discharge. Tearful in triage.

## 2023-05-16 NOTE — ED Notes (Signed)
See triage note  Presents with pain to pelvic area  States pain started 3 days ago  Denies any urinary sxs' or fever  Positive nausea

## 2023-05-16 NOTE — ED Provider Notes (Signed)
[facility] Provider Note    Event Date/Time   First MD Initiated Contact with Patient 05/16/23 [phone]     (approximate)   History   Pelvic Pain   HPI  Valerie Morales is a 47 y.o. female with a past medical history of morbid obesity, anxiety,, hypertension who presents today for evaluation of pelvic pain.  Patient reports this has been ongoing for 4 days.  She reports that she is sexually active with her husband only.  She has not noticed any discharge.  She denies nausea or vomiting.  She denies fevers or chills.  She reports that she has been pregnant 4 times, had 1 abortion and 3 C-sections.  She denies urinary symptoms.  She is very tearful because she says that she is frustrated that she has pain.  She declines analgesia.  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   Elevated liver enzymes 07/25/2019   Allergic rhinitis 08/11/2018   Chronic sinusitis 08/11/2018   Vitamin D deficiency 04/11/2018   Abnormal mammogram of left breast 04/11/2018   Plantar fasciitis 01/09/2018   Essential hypertension 12/18/2017   Anxiety and depression 12/18/2017   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   Anemia 12/15/2017   Pelvic pain 12/15/2017   Breast pain 12/15/2017   Chronic pain of left ankle 12/15/2017   Morbid obesity (HCC) 12/15/2017   Annual physical exam 01/02/2014   Vaginal discharge 01/02/2014   Fatigue 12/21/2013   Hot flashes 12/21/2013   Sleep  disturbance 12/21/2013          Physical Exam   Triage Vital Signs: ED Triage Vitals  Encounter Vitals Group     BP 05/16/23 0757 (!) 112/98     Systolic BP Percentile --      Diastolic BP Percentile --      Pulse Rate 05/16/23 0757 (!) 110     Resp 05/16/23 0757 18     Temp 05/16/23 0757 98.5 F (36.9 C)     Temp Source 05/16/23 0757 Oral     SpO2 05/16/23 0757 98 %     Weight 05/16/23 0758 205 lb (93 kg)     Height 05/16/23 0758 5\' 3"  (1.6 m)     Head Circumference --      Peak Flow --      Pain Score 05/16/23 0758 7     Pain Loc --      Pain Education --      Exclude from Growth Chart --     Most recent vital signs: Vitals:   05/16/23 0757  BP: (!) 112/98  Pulse: (!) 110  Resp: 18  Temp: 98.5 F (36.9 C)  SpO2: 98%    Physical Exam Vitals and nursing note reviewed.  Constitutional:      General: Awake and alert.  Very tearful and avoidant of eye contact.  Sitting crosslegged and appears to not be in any physical distress    Appearance: Normal appearance. The  patient is obese.  HENT:     Head: Normocephalic and atraumatic.     Mouth: Mucous membranes are moist.  Eyes:     General: PERRL. Normal EOMs        Right eye: No discharge.        Left eye: No discharge.     Conjunctiva/sclera: Conjunctivae normal.  Cardiovascular:     Rate and Rhythm: Normal rate and regular rhythm.     Pulses: Normal pulses.  Pulmonary:     Effort: Pulmonary effort is normal. No respiratory distress.     Breath sounds: Normal breath sounds.  Abdominal:     Abdomen is soft. There is no abdominal tenderness. No rebound or guarding. No distention. Pelvic exam: Scant white discharge in vaginal vault.  Normal-appearing cervix.  No adnexal fullness or tenderness.  No cervical motion tenderness. nNgative chandelier sign. Musculoskeletal:        General: No swelling. Normal range of motion.     Cervical back: Normal range of motion and neck supple.  Skin:    General: Skin is warm  and dry.     Capillary Refill: Capillary refill takes less than 2 seconds.     Findings: No rash.  Neurological:     Mental Status: The patient is awake and alert.      ED Results / Procedures / Treatments   Labs (all labs ordered are listed, but only abnormal results are displayed) Labs Reviewed  WET PREP, GENITAL - Abnormal; Notable for the following components:      Result Value   Clue Cells Wet Prep HPF POC PRESENT (*)    All other components within normal limits  LIPASE, BLOOD - Abnormal; Notable for the following components:   Lipase 54 (*)    All other components within normal limits  COMPREHENSIVE METABOLIC PANEL - Abnormal; Notable for the following components:   AST 65 (*)    All other components within normal limits  CBC - Abnormal; Notable for the following components:   RBC 3.82 (*)    All other components within normal limits  URINALYSIS, ROUTINE W REFLEX MICROSCOPIC - Abnormal; Notable for the following components:   Color, Urine STRAW (*)    APPearance CLEAR (*)    All other components within normal limits  CHLAMYDIA/NGC RT PCR ([facility] ONLY)            POC URINE PREG, ED     EKG     RADIOLOGY I independently reviewed and interpreted imaging and agree with radiologists findings.     PROCEDURES:  Critical Care performed:   Procedures   MEDICATIONS ORDERED IN ED: Medications - No data to display   IMPRESSION / MDM / ASSESSMENT AND PLAN / ED COURSE  I reviewed the triage vital signs and the nursing notes.   Differential diagnosis includes, but is not limited to, sexually transmitted disease, PID, endometriosis, uterine fibroids, UTI.  Patient is awake and alert, mildly tachycardic on arrival although normotensive and afebrile.  She is tearful on exam, reports that she is tearful because she is frustrated that she has pain and always cries when she is frustrated, though denies being frustrated about anything else and also declines  analgesia.  Pelvic exam performed with chaperone Makaya.  Patient has scant white discharge in her vaginal vault which was cultured.  On bimanual exam she has no adnexal fullness or tenderness, negative chandelier sign.  GC chlamydia was negative.  Ultrasound obtained given her degree of discomfort  was negative for any acute findings to explain her symptoms today.  Wet prep obtained reveals clue cells, suggestive of bacterial vaginosis.  We discussed treatment options including p.o. versus intravaginal metronidazole.  Patient opts for p.o. metronidazole.  Discussed expected side effects, advised that she cannot have alcohol with this medication.  Recommend that she follow-up with OB/GYN.  We discussed return precautions and the importance of close outpatient follow-up.  Patient or stands and agrees with plan.  She was discharged in stable condition.   Patient's presentation is most consistent with acute complicated illness / injury requiring diagnostic workup.     FINAL CLINICAL IMPRESSION(S) / ED DIAGNOSES   Final diagnoses:  BV (bacterial vaginosis)     Rx / DC Orders   ED Discharge Orders          Ordered    metroNIDAZOLE (FLAGYL) 500 MG tablet  2 times daily        05/16/23 1142             Note:  This document was prepared using Dragon voice recognition software and may include unintentional dictation errors.   Jackelyn Hoehn, PA-C 05/16/23 1519    Phineas Semen, MD 05/16/23 [phone]

## 2023-05-16 NOTE — Discharge Instructions (Addendum)
You are found to have bacterial vaginosis.  Please take the antibiotics as prescribed.  Please return for any new, worsening, or changing symptoms or other concerns.  It was a pleasure caring for you today.

## 2023-05-19 ENCOUNTER — Telehealth: Payer: Self-pay

## 2023-05-19 NOTE — Transitions of Care (Post Inpatient/ED Visit) (Signed)
Unable to reach pt by phone and left v/m requesting cb [phone].      05/19/2023  Name: Valerie Morales MRN: [id number] DOB: 06/26/1976  Today's TOC FU Call Status: Today's TOC FU Call Status:: Unsuccessful Call (1st Attempt) Unsuccessful Call (1st Attempt) Date: 05/19/23  Attempted to reach the patient regarding the most recent Inpatient/ED visit.  Follow Up Plan: Additional outreach attempts will be made to reach the patient to complete the Transitions of Care (Post Inpatient/ED visit) call.   Signature  Lewanda Rife, LPN

## 2023-05-20 NOTE — Transitions of Care (Post Inpatient/ED Visit) (Signed)
Unable to reach pt by phone and left v/m requesting pt to cb [phone].       05/20/2023  Name: Valerie Morales MRN: [id number] DOB: February 06, 1976  Today's TOC FU Call Status: Today's TOC FU Call Status:: Unsuccessful Call (2nd Attempt) Unsuccessful Call (1st Attempt) Date: 05/19/23 Unsuccessful Call (2nd Attempt) Date: 05/20/23  Attempted to reach the patient regarding the most recent Inpatient/ED visit.  Follow Up Plan: Additional outreach attempts will be made to reach the patient to complete the Transitions of Care (Post Inpatient/ED visit) call.   Signature  Lewanda Rife, LPN

## 2023-06-02 ENCOUNTER — Ambulatory Visit
Admission: RE | Admit: 2023-06-02 | Discharge: 2023-06-02 | Disposition: A | Discharge status: Home / Self Care | Payer: 59 | Source: Ambulatory Visit | Attending: Nurse Practitioner | Admitting: Nurse Practitioner

## 2023-06-02 ENCOUNTER — Encounter: Payer: Self-pay | Admitting: Nurse Practitioner

## 2023-06-02 ENCOUNTER — Ambulatory Visit: Payer: 59 | Admitting: Nurse Practitioner

## 2023-06-02 VITALS — BP 142/86 | HR 112 | Temp 98.1°F | Ht 63.0 in | Wt 210.2 lb

## 2023-06-02 DIAGNOSIS — R102 Pelvic and perineal pain: Secondary | ICD-10-CM | POA: Insufficient documentation

## 2023-06-02 MED ORDER — IOHEXOL 300 MG/ML  SOLN
100.0000 mL | Freq: Once | INTRAMUSCULAR | Status: AC | PRN
  Administered 2023-06-02: 100 mL via INTRAVENOUS

## 2023-06-02 NOTE — Assessment & Plan Note (Addendum)
Tenderness to palpitation to LLU and left pelvic area. Needs further work up. Stat CT abdomen pelvis and lab ordered.

## 2023-06-02 NOTE — Progress Notes (Addendum)
Established Patient Office Visit  Subjective:  Patient ID: Valerie Morales, female    DOB: Nov 12, 1975  Age: 47 y.o. MRN: [id number]  CC:  Chief Complaint  Patient presents with   Pelvic Pain    HPI  Valerie Morales presents for pelvic pain on the left side that moves to the center. The pain is all the time. She has been seen in the ED on 05/16/23 was diagnosed with BV and treated with PO metronidazole.  She states that the pain started back again on Monday and was not able to go to work due pain and is tearful.  Pt states that the pain radiates to the back. She has history of endometrial ablation and tubal ligation.  Have some nausea. Denise any discharge, dysuria, diarrhea  EXAM: TRANSABDOMINAL AND TRANSVAGINAL ULTRASOUND OF PELVIS  IMPRESSION: *Essentially unremarkable exam. *Hemorrhagic cyst in the left ovary, for which no further follow-up is recommended.  HPI   Past Medical History:  Diagnosis Date   Anemia    has required 2 units PRBC   Anxiety    B12 deficiency    Back pain    Bipolar depression (HCC)    Chest pain    Chicken pox    Depression    trazadone has not helped in the past    Fatty liver    Headache    High cholesterol    History of blood transfusion 2009   [place]   Hypertension    MRSA (methicillin resistant Staphylococcus aureus)    Shortness of breath on exertion    Sinusitis    Sinusitis, chronic    Sleep apnea    UTI (urinary tract infection)    Vitamin D deficiency     Past Surgical History:  Procedure Laterality Date   ANKLE ARTHROSCOPY Left 05/09/2020   Procedure: ANKLE ARTHROSCOPY OCD REPAIR LEFT;  Surgeon: Gwyneth Revels, DPM;  Location: [facility];  Service: Podiatry;  Laterality: Left;   CESAREAN SECTION     x3 1995, 1996, 1998   CHOLECYSTECTOMY  1998   COLONOSCOPY WITH PROPOFOL N/A 05/01/2019   Procedure: COLONOSCOPY WITH PROPOFOL;  Surgeon: Midge Minium, MD;  Location: [facility];  Service: Endoscopy;   Laterality: N/A;   ESOPHAGOGASTRODUODENOSCOPY (EGD) WITH PROPOFOL N/A 05/01/2019   Procedure: ESOPHAGOGASTRODUODENOSCOPY (EGD) WITH PROPOFOL;  Surgeon: Midge Minium, MD;  Location: [facility];  Service: Endoscopy;  Laterality: N/A;   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 in [facility]    GRAFT APPLICATION Left 12/04/2021   Procedure: GRAFT APPLICATION - CALCANEAL AUTOGRAFT;  Surgeon: Gwyneth Revels, DPM;  Location: [facility];  Service: Podiatry;  Laterality: Left;   NASAL SINUS SURGERY     2009/2010   ORIF CALCANEOUS FRACTURE Left 12/04/2021   Procedure: REPAIR NONUNION - CALCANEAL EVANS OSTEOTOMY SITE;  Surgeon: Gwyneth Revels, DPM;  Location: [facility];  Service: Podiatry;  Laterality: Left;   OSTECTOMY Left 05/09/2020   Procedure: EVANS/MCDO LEFT;  Surgeon: Gwyneth Revels, DPM;  Location: [facility];  Service: Podiatry;  Laterality: Left;   TENDON TRANSFER Left 05/09/2020   Procedure: FDL TRANSFER;DEEP LEFT;  Surgeon: Gwyneth Revels, DPM;  Location: [facility];  Service: Podiatry;  Laterality: Left;   TUBAL LIGATION     uterine ablation  2010   WOUND DEBRIDEMENT Left 05/09/2020   Procedure: A-SCOPE/DEBRIDEMENT/EXTENSIVE LEFT;  Surgeon: Gwyneth Revels, DPM;  Location: [facility];  Service: Podiatry;  Laterality: Left;    Family History  Problem Relation Age of  Onset   Obesity Mother    Sleep apnea Mother    Thyroid disease Mother    Hyperlipidemia Mother    Cancer Mother 37       lung cancer-small cell lung cancer    Heart disease Mother        MI with stents   Hypertension Mother    Diabetes Mother    COPD Mother    Hypertension Father    Hyperlipidemia Father    Cancer Maternal Grandmother        brain cancer    Alcohol abuse Maternal Grandfather    Heart disease Maternal Grandfather    Arthritis Paternal Grandmother    Cancer Paternal Grandmother        leukemia    Hypertension Paternal Grandmother    Breast cancer Paternal Grandmother    Heart disease Paternal  Grandfather    Hypertension Paternal Grandfather    Autism spectrum disorder Daughter    Mental illness Son    Cancer Maternal Aunt        breast cancer   Breast cancer Maternal Aunt    Alcohol abuse Maternal Aunt        Liver Failure   Cancer Maternal Uncle        colon cancer    Social History   Socioeconomic History   Marital status: Married    Spouse name: Maurine Minister   Number of children: 3   Years of education: 16   Highest education level: Not on file  Occupational History   Occupation: caseworker  Tobacco Use   Smoking status: Never   Smokeless tobacco: Never  Vaping Use   Vaping status: Never Used  Substance and Sexual Activity   Alcohol use: Yes    Comment: 2 DRINK WEEKLY   Drug use: Not Currently    Types: Marijuana    Comment: 17 YEARS AGO   Sexual activity: Yes    Birth control/protection: None, Surgical    Comment: tubal ligation  Other Topics Concern   Not on file  Social History Narrative   Ysabel "[name]" grew up in [place], [place]. She attended [employer] and obtained her Bachelors in [employer]. She lives in [place] with her 3 children and her husband       Caffeine - 2 coffee, no sodas   Exercise - not currently   Bachelors degree    Caseworker    4 pregnancies, 3 live births    Feels safe in relationship    Wears seatbelt   No guns       Social Determinants of Health   Financial Resource Strain: Not on file  Food Insecurity: Not on file  Transportation Needs: Not on file  Physical Activity: Not on file  Stress: Not on file  Social Connections: Not on file  Intimate Partner Violence: Not on file     Outpatient Medications Prior to Visit  Medication Sig Dispense Refill   amLODipine (NORVASC) 5 MG tablet Take 1 tablet (5 mg total) by mouth daily. 30 tablet 0   cetirizine (ZYRTEC) 10 MG tablet Take 10 mg by mouth at bedtime.     diphenhydrAMINE (BENADRYL) 25 MG tablet Take 25 mg by mouth daily as needed for allergies.       EPINEPHrine 0.3 mg/0.3 mL IJ SOAJ injection Inject 0.3 mg into the muscle as needed for anaphylaxis. 1 each 1   LORazepam (ATIVAN) 0.5 MG tablet Take 1 tablet (0.5 mg total) by mouth daily as needed for anxiety.  30 tablet 2   olmesartan-hydrochlorothiazide (BENICAR HCT) 20-12.5 MG tablet Take 1 tablet by mouth once daily 90 tablet 2   ondansetron (ZOFRAN) 4 MG tablet TAKE 1 TABLET BY MOUTH EVERY 8 HOURS AS NEEDED FOR NAUSEA FOR VOMITING 40 tablet 2   sulfamethoxazole-trimethoprim (BACTRIM DS) 800-160 MG tablet Take 1 tablet by mouth 2 (two) times daily. 14 tablet 0   venlafaxine XR (EFFEXOR XR) 37.5 MG 24 hr capsule Take 1 capsule (37.5 mg total) by mouth daily with breakfast. Take with 150 mg capsule to total 187.5 mg daily. 90 capsule 3   venlafaxine XR (EFFEXOR-XR) 150 MG 24 hr capsule Take 1 capsule (150 mg total) by mouth daily with breakfast. 90 capsule 3   No facility-administered medications prior to visit.    Allergies  Allergen Reactions   Other Hives     Nut allergy . Walnuts and hazelnuts are the worst.    Penicillins Itching    Hives    Percocet [Oxycodone-Acetaminophen] Hives    Hives    Ambien [Zolpidem]     Hallucinations falls   Lipitor [Atorvastatin]     Unable to tolerate h/o fatty liver with elevated lfts    Molds & Smuts Other (See Comments)    Congestion and sneezing    Aspirin Itching    hives   Latex Rash    Hives .     ROS Review of Systems Negative unless indicated in HPI.    Objective:    Physical Exam Constitutional:      Appearance: Normal appearance.  Cardiovascular:     Rate and Rhythm: Regular rhythm.     Pulses: Normal pulses.     Heart sounds: Normal heart sounds.  Abdominal:     General: Bowel sounds are normal.     Palpations: Abdomen is soft.     Tenderness: There is abdominal tenderness (LLU, left pelvic region). There is no right CVA tenderness or left CVA tenderness.  Musculoskeletal:     Cervical back: Normal range of motion.   Neurological:     General: No focal deficit present.     Mental Status: She is alert. Mental status is at baseline.  Psychiatric:        Mood and Affect: Mood normal.        Behavior: Behavior normal.        Thought Content: Thought content normal.        Judgment: Judgment normal.     BP (!) 142/86   Pulse (!) 112   Temp 98.1 F (36.7 C) (Oral)   Ht 5\' 3"  (1.6 m)   Wt 210 lb 3.2 oz (95.3 kg)   SpO2 97%   BMI 37.24 kg/m  Wt Readings from Last 3 Encounters:  06/02/23 210 lb 3.2 oz (95.3 kg)  05/16/23 205 lb (93 kg)  04/21/23 219 lb (99.3 kg)     Health Maintenance  Topic Date Due   HIV Screening  Never done   Hepatitis C Screening  Never done   COVID-19 Vaccine (3 - 2023-24 season) 06/18/2022   PAP SMEAR-Modifier  08/14/2022   INFLUENZA VACCINE  01/16/2024 (Originally 05/19/2023)   Colonoscopy  04/30/2029   DTaP/Tdap/Td (3 - Td or Tdap) 02/15/2033   HPV VACCINES  Aged Out    There are no preventive care reminders to display for this patient.  Lab Results  Component Value Date   TSH 2.220 05/05/2021   Lab Results  Component Value Date   WBC 7.5 05/16/2023  HGB 13.0 05/16/2023   HCT 37.2 05/16/2023   MCV 97.4 05/16/2023   PLT 276 05/16/2023   Lab Results  Component Value Date   NA 137 05/16/2023   K 3.5 05/16/2023   CO2 25 05/16/2023   GLUCOSE 94 05/16/2023   BUN 7 05/16/2023   CREATININE 0.56 05/16/2023   BILITOT 0.4 05/16/2023   ALKPHOS 109 05/16/2023   AST 65 (H) 05/16/2023   ALT 32 05/16/2023   PROT 7.5 05/16/2023   ALBUMIN 3.9 05/16/2023   CALCIUM 9.1 05/16/2023   ANIONGAP 13 05/16/2023   GFR 103.77 10/13/2021   Lab Results  Component Value Date   CHOL 202 (H) 10/13/2021   Lab Results  Component Value Date   HDL 94.40 10/13/2021   Lab Results  Component Value Date   LDLCALC 69 10/13/2021   Lab Results  Component Value Date   TRIG 192.0 (H) 10/13/2021   Lab Results  Component Value Date   CHOLHDL 2 10/13/2021   Lab Results   Component Value Date   HGBA1C 5.5 03/05/2021      Assessment & Plan:  Pelvic pain Assessment & Plan: Tenderness to palpitation to LLU and left pelvic area. Transvaginal US unremarkable on 05/16/23 Need further work up. Stat CT abdomen pelvis and lab ordered.    Orders: -     Urinalysis, Routine w reflex microscopic -     CT ABDOMEN PELVIS W CONTRAST; Future -     Comprehensive metabolic panel -     Ambulatory referral to Obstetrics / Gynecology    Follow-up: No follow-ups on file.   Kara Dies, NP

## 2023-06-03 ENCOUNTER — Other Ambulatory Visit: Payer: Self-pay | Admitting: Nurse Practitioner

## 2023-06-03 ENCOUNTER — Encounter: Payer: Self-pay | Admitting: Nurse Practitioner

## 2023-06-03 LAB — URINALYSIS, ROUTINE W REFLEX MICROSCOPIC
Bilirubin Urine: NEGATIVE
Hgb urine dipstick: NEGATIVE
Leukocytes,Ua: NEGATIVE
Nitrite: NEGATIVE
RBC / HPF: NONE SEEN (ref 0–?)
Specific Gravity, Urine: 1.025 (ref 1.000–1.030)
Total Protein, Urine: NEGATIVE
Urine Glucose: NEGATIVE
Urobilinogen, UA: 0.2 (ref 0.0–1.0)
pH: 6 (ref 5.0–8.0)

## 2023-06-03 LAB — COMPREHENSIVE METABOLIC PANEL
ALT: 58 U/L — ABNORMAL HIGH (ref 0–35)
AST: 185 U/L — ABNORMAL HIGH (ref 0–37)
Albumin: 4.5 g/dL (ref 3.5–5.2)
Alkaline Phosphatase: 172 U/L — ABNORMAL HIGH (ref 39–117)
BUN: 12 mg/dL (ref 6–23)
CO2: 21 meq/L (ref 19–32)
Calcium: 9.2 mg/dL (ref 8.4–10.5)
Chloride: 91 meq/L — ABNORMAL LOW (ref 96–112)
Creatinine, Ser: 0.83 mg/dL (ref 0.40–1.20)
GFR: 83.62 mL/min (ref >60.00)
Glucose, Bld: 100 mg/dL — ABNORMAL HIGH (ref 70–99)
Potassium: 3.7 meq/L (ref 3.5–5.1)
Sodium: 127 meq/L — ABNORMAL LOW (ref 135–145)
Total Bilirubin: 0.5 mg/dL (ref 0.2–1.2)
Total Protein: 7.8 g/dL (ref 6.0–8.3)

## 2023-06-03 MED ORDER — TRAMADOL HCL 50 MG PO TABS: 50.0000 mg | ORAL_TABLET | Freq: Two times a day (BID) | ORAL | 0 refills | Status: AC | PRN

## 2023-06-03 NOTE — Progress Notes (Signed)
Pain medication sent ?

## 2023-06-22 ENCOUNTER — Encounter: Payer: Self-pay | Admitting: Emergency Medicine

## 2023-06-22 ENCOUNTER — Emergency Department: Payer: 59

## 2023-06-22 ENCOUNTER — Emergency Department
Admission: EM | Admit: 2023-06-22 | Discharge: 2023-06-22 | Disposition: A | Discharge status: Home / Self Care | Payer: 59 | Attending: Emergency Medicine | Admitting: Emergency Medicine

## 2023-06-22 ENCOUNTER — Other Ambulatory Visit: Payer: Self-pay

## 2023-06-22 DIAGNOSIS — R102 Pelvic and perineal pain: Secondary | ICD-10-CM

## 2023-06-22 DIAGNOSIS — N83202 Unspecified ovarian cyst, left side: Secondary | ICD-10-CM | POA: Diagnosis not present

## 2023-06-22 DIAGNOSIS — I10 Essential (primary) hypertension: Secondary | ICD-10-CM | POA: Insufficient documentation

## 2023-06-22 LAB — BASIC METABOLIC PANEL WITH GFR
Anion gap: 11 (ref 5–15)
BUN: 6 mg/dL (ref 6–20)
CO2: 25 mmol/L (ref 22–32)
Calcium: 8.5 mg/dL — ABNORMAL LOW (ref 8.9–10.3)
Chloride: 94 mmol/L — ABNORMAL LOW (ref 98–111)
Creatinine, Ser: 0.66 mg/dL (ref 0.44–1.00)
GFR, Estimated: >60 mL/min
Glucose, Bld: 94 mg/dL (ref 70–99)
Potassium: 3.6 mmol/L (ref 3.5–5.1)
Sodium: 130 mmol/L — ABNORMAL LOW (ref 135–145)

## 2023-06-22 LAB — POC URINE PREG, ED
Preg Test, Ur: NEGATIVE
Preg Test, Ur: NEGATIVE

## 2023-06-22 LAB — CBC WITH DIFFERENTIAL/PLATELET
Abs Immature Granulocytes: 0 10*3/uL (ref 0.00–0.07)
Basophils Absolute: 0 10*3/uL (ref 0.0–0.1)
Basophils Relative: 1 %
Eosinophils Absolute: 0.1 10*3/uL (ref 0.0–0.5)
Eosinophils Relative: 3 %
HCT: 35.3 % — ABNORMAL LOW (ref 36.0–46.0)
Hemoglobin: 12.4 g/dL (ref 12.0–15.0)
Immature Granulocytes: 0 %
Lymphocytes Relative: 48 %
Lymphs Abs: 1.9 10*3/uL (ref 0.7–4.0)
MCH: 34.1 pg — ABNORMAL HIGH (ref 26.0–34.0)
MCHC: 35.1 g/dL (ref 30.0–36.0)
MCV: 97 fL (ref 80.0–100.0)
Monocytes Absolute: 0.2 10*3/uL (ref 0.1–1.0)
Monocytes Relative: 5 %
Neutro Abs: 1.7 10*3/uL (ref 1.7–7.7)
Neutrophils Relative %: 43 %
Platelets: 230 10*3/uL (ref 150–400)
RBC: 3.64 MIL/uL — ABNORMAL LOW (ref 3.87–5.11)
RDW: 13.8 % (ref 11.5–15.5)
WBC: 4 10*3/uL (ref 4.0–10.5)
nRBC: 0 % (ref 0.0–0.2)

## 2023-06-22 MED ORDER — IBUPROFEN 800 MG PO TABS: 800.0000 mg | ORAL_TABLET | Freq: Three times a day (TID) | ORAL | 0 refills | Status: AC | PRN

## 2023-06-22 MED ORDER — CYCLOBENZAPRINE HCL 5 MG PO TABS: 5.0000 mg | ORAL_TABLET | Freq: Three times a day (TID) | ORAL | 0 refills | Status: DC | PRN

## 2023-06-22 MED ORDER — FENTANYL CITRATE PF 50 MCG/ML IJ SOSY
50.0000 ug | PREFILLED_SYRINGE | Freq: Once | INTRAMUSCULAR | Status: AC
  Administered 2023-06-22: 50 ug via INTRAMUSCULAR
  Filled 2023-06-22: qty 1

## 2023-06-22 MED ORDER — OXYCODONE-ACETAMINOPHEN 5-325 MG PO TABS
1.0000 | ORAL_TABLET | Freq: Three times a day (TID) | ORAL | 0 refills | Status: AC | PRN
Start: 2023-06-22 — End: 2023-06-27

## 2023-06-22 NOTE — ED Provider Notes (Signed)
[facility] Provider Note    Event Date/Time   First MD Initiated Contact with Patient 06/22/23 1201     (approximate)   History   Pelvic Pain   HPI  Valerie Morales is a 47 y.o. female with PMH of HTN and depression who presents for evaluation of ongoing pelvic pain.  Patient has a known hemorrhagic cyst on her left ovary identified previously with ultrasound and CT scan.  Patient reports a significant increase in her pain this morning which is what brought her in.  Scheduled to see a surgeon on 9/23. No discharge, does endorse nausea.       Physical Exam   Triage Vital Signs: ED Triage Vitals [06/22/23 1124]  Encounter Vitals Group     BP (!) 129/91     Systolic BP Percentile      Diastolic BP Percentile      Pulse Rate 85     Resp 18     Temp 98.6 F (37 C)     Temp Source Oral     SpO2 100 %     Weight 218 lb (98.9 kg)     Height 5\' 3"  (1.6 m)     Head Circumference      Peak Flow      Pain Score 8     Pain Loc      Pain Education      Exclude from Growth Chart     Most recent vital signs: Vitals:   06/22/23 1124 06/22/23 1436  BP: (!) 129/91 133/87  Pulse: 85 100  Resp: 18 17  Temp: 98.6 F (37 C)   SpO2: 100% 100%     General: Awake, no distress.  CV:  Good peripheral perfusion. RRR. Resp:  Normal effort. CTAB. Abd:  No distention.  Soft, tender to palpation left lower quadrant.    ED Results / Procedures / Treatments   Labs (all labs ordered are listed, but only abnormal results are displayed) Labs Reviewed  CBC WITH DIFFERENTIAL/PLATELET - Abnormal; Notable for the following components:      Result Value   RBC 3.64 (*)    HCT 35.3 (*)    MCH 34.1 (*)    All other components within normal limits  BASIC METABOLIC PANEL - Abnormal; Notable for the following components:   Sodium 130 (*)    Chloride 94 (*)    Calcium 8.5 (*)    All other components within normal limits  POC URINE PREG, ED     RADIOLOGY  Pelvic ultrasound obtained, I interpreted the images. PROCEDURES:  Critical Care performed: No  Procedures   MEDICATIONS ORDERED IN ED: Medications - No data to display   IMPRESSION / MDM / ASSESSMENT AND PLAN / ED COURSE  I reviewed the triage vital signs and the nursing notes.                             47 year old female with known ovarian cyst presents for an increase in her pelvic pain.  Patient was mildly hypertensive in triage, NAD and nontoxic-appearing on exam.  Differential diagnosis includes, but is not limited to, ovarian torsion, ovarian cyst rupture, hemorrhagic cyst, diverticulitis.   Patient's presentation is most consistent with acute complicated illness / injury requiring diagnostic workup.  Ultrasound obtained to evaluate for cyst rupture and ovarian torsion.   CBC unremarkable, BMP shows mild hyponatremia.  At shift change I was  still waiting for ultrasound results. If imaging is negative patient can be given toradol while in the ED and will be sent home with pain medication. Patient should follow up with her OB/GYN.  Care of patient was passed off to [facility] PA-C who will make final diagnosis and plan.       FINAL CLINICAL IMPRESSION(S) / ED DIAGNOSES   Final diagnoses:  Pelvic pain in female  Cyst of left ovary     Rx / DC Orders   ED Discharge Orders     None        Note:  This document was prepared using Dragon voice recognition software and may include unintentional dictation errors.   Cameron Ali, PA-C 06/22/23 1523    Shaune Pollack, MD 06/22/23 [phone]

## 2023-06-22 NOTE — Discharge Instructions (Addendum)
Please follow up with your OB-GYN as scheduled. Take the prescription pain medicine as directed.

## 2023-06-22 NOTE — ED Provider Notes (Signed)
----------------------------------------- 3:15 PM on 06/22/2023 -----------------------------------------  Blood pressure 133/87, pulse 100, temperature 98.6 F (37 C), temperature source Oral, resp. rate 17, height 5\' 3"  (1.6 m), weight 98.9 kg, SpO2 100%.  Assuming care from Gwenlyn Fudge, PA-C/NP-C.  In short, Valerie Morales is a 47 y.o. female with a chief complaint of Pelvic Pain .  Refer to the original H&P for additional details.  The current plan of care is to await ultrasound results and disposition the patient accordingly.  ____________________________________________    ED Results / Procedures / Treatments   Labs (all labs ordered are listed, but only abnormal results are displayed) Labs Reviewed  CBC WITH DIFFERENTIAL/PLATELET - Abnormal; Notable for the following components:      Result Value   RBC 3.64 (*)    HCT 35.3 (*)    MCH 34.1 (*)    All other components within normal limits  BASIC METABOLIC PANEL - Abnormal; Notable for the following components:   Sodium 130 (*)    Chloride 94 (*)    Calcium 8.5 (*)    All other components within normal limits  POC URINE PREG, ED  POC URINE PREG, ED     EKG    RADIOLOGY  I personally viewed and evaluated these images as part of my medical decision making, as well as reviewing the written report by the radiologist.  ED Provider Interpretation: Stable left ovarian cyst decreased in size from previous study}  US PELVIC COMPLETE W TRANSVAGINAL AND TORSION R/O  Result Date: 06/22/2023 CLINICAL DATA:  Pelvic pain EXAM: TRANSABDOMINAL AND TRANSVAGINAL ULTRASOUND OF PELVIS DOPPLER ULTRASOUND OF OVARIES TECHNIQUE: Both transabdominal and transvaginal ultrasound examinations of the pelvis were performed. Transabdominal technique was performed for global imaging of the pelvis including uterus, ovaries, adnexal regions, and pelvic cul-de-sac. It was necessary to proceed with endovaginal exam following the transabdominal  exam to visualize the adnexa and endometrium. Color and duplex Doppler ultrasound was utilized to evaluate blood flow to the ovaries. COMPARISON:  Pelvic ultrasound 05/16/2023.  CT 06/02/2023 FINDINGS: Uterus Measurements: 7.6 x 3.8 x 4.7 cm = volume: 71.0 mL. Heterogeneous myometrium with some lobular areas. Please correlate for areas of fibroid infiltration. Endometrium Thickness: 5.  No focal abnormality visualized. Right ovary Measurements: 2.6 x 1.7 x 2.9 cm = volume: 6.6 mL. Normal appearance/no adnexal mass. Left ovary Measurements: 5.1 x 2.8 x 3.9 cm = volume: 29.1 mL. Small follicles are seen. There is a focal hypoechoic area as well with the heterogeneity measuring 3.3 x 2.6 x 3.0 cm. Previously there is a complex cystic area on CT measuring up to 5.3 cm. The rest of the ovary does appear heterogeneous as well. Pulsed Doppler evaluation of both ovaries demonstrates normal low-resistance arterial and venous waveforms. Other findings No abnormal free fluid. IMPRESSION: Compared to the prior CT scan the complex cystic lesion involving the left ovary is smaller now measuring up to 3.3 cm. The rest of the left ovary also has a slightly heterogeneous appearance. Preserved blood flow seen to the ovaries. No free fluid. Recommend followup US in 3-6 months. Note: This recommendation does not apply to premenarchal patients or to those with increased risk (genetic, family history, elevated tumor markers or other high-risk factors) of ovarian cancer. Reference: Radiology 2019 Nov; 293(2):359-371. Electronically Signed   By: Karen Kays M.D.   On: 06/22/2023 15:25     PROCEDURES:  Critical Care performed: No  Procedures   MEDICATIONS ORDERED IN ED: Medications  fentaNYL (SUBLIMAZE) injection  50 mcg (50 mcg Intramuscular Given 06/22/23 1552)     IMPRESSION / MDM / ASSESSMENT AND PLAN / ED COURSE  I reviewed the triage vital signs and the nursing notes.                              Differential  diagnosis includes, but is not limited to, ovarian cyst, ovarian torsion, endometritis  Patient's presentation is most consistent with acute complicated illness / injury requiring diagnostic workup.  Patient's diagnosis is consistent with pelvic pain likely secondary to ovarian cyst.  Patient with an ovarian cyst that is decreasing in size from previous study.  Patient will be discharged home with prescriptions for Flexeril, ibuprofen, and Percocet. Patient is to follow up with Dr. Dalbert Garnet as scheduled, as needed or otherwise directed. Patient is given ED precautions to return to the ED for any worsening or new symptoms.     FINAL CLINICAL IMPRESSION(S) / ED DIAGNOSES   Final diagnoses:  Pelvic pain in female  Cyst of left ovary     Rx / DC Orders   ED Discharge Orders          Ordered    cyclobenzaprine (FLEXERIL) 5 MG tablet  3 times daily PRN        06/22/23 1546    ibuprofen (ADVIL) 800 MG tablet  Every 8 hours PRN        06/22/23 1546    oxyCODONE-acetaminophen (PERCOCET) 5-325 MG tablet  Every 8 hours PRN        06/22/23 1546             Note:  This document was prepared using Dragon voice recognition software and may include unintentional dictation errors.    Lissa Hoard, PA-C 06/22/23 1611    Chesley Noon, MD 06/22/23 [phone]

## 2023-06-22 NOTE — ED Triage Notes (Signed)
Patient to ED via POV for pelvic pain x1 month. Has known ovarian cyst and suppose to have appointment with surgeon on 9/23. PT reports increased pain and nausea.

## 2023-06-27 ENCOUNTER — Telehealth: Payer: Self-pay

## 2023-06-27 NOTE — Transitions of Care (Post Inpatient/ED Visit) (Signed)
I spoke with pt; pt had pelvic pain; pt seen [facility] ED on 06/22/23 ; pt was dx with cyst left cyst. Pt stated at this time she is feeling better.pt already has appt with OB GYN on 07/11/23  UC & ED precautions given and pt voiced understanding. Sending note to Jacqualin Combes NP.      06/27/2023  Name: Valerie Morales MRN: [id number] DOB: 1976-09-13  Today's TOC FU Call Status: Today's TOC FU Call Status:: Successful TOC FU Call Completed TOC FU Call Complete Date: 06/27/23 Patient's Name and Date of Birth confirmed.  Transition Care Management Follow-up Telephone Call Date of Discharge: 06/22/23 Discharge Facility: [facility]) Type of Discharge: Emergency Department Reason for ED Visit: Other: (pelvic pain; pt seen [facility] ED on 06/22/23 ; pt was dx with cyst left cyst. pt already has appt with OB GYN on 07/11/23) How have you been since you were released from the hospital?: Better Any questions or concerns?: No  Items Reviewed: Did you receive and understand the discharge instructions provided?: Yes Medications obtained,verified, and reconciled?: Yes (Medications Reviewed) (pt given cyclobenzaprine 5 mg, ibuporfen 800 mg and oxycodone apap 5-325 mg at ED.) Any new allergies since your discharge?: No Dietary orders reviewed?: NA Do you have support at home?: Yes People in Home: spouse Name of Support/Comfort Primary Source: Maurine Minister  Medications Reviewed Today: Medications Reviewed Today   Medications were not reviewed in this encounter     Home Care and Equipment/Supplies: Were Home Health Services Ordered?: NA Any new equipment or medical supplies ordered?: NA  Functional Questionnaire: Do you need assistance with bathing/showering or dressing?: No Do you need assistance with meal preparation?: No Do you need assistance with eating?: No Do you have difficulty maintaining continence: No Do you need assistance with getting out of bed/getting out of a  chair/moving?: No Do you have difficulty managing or taking your medications?: No  Follow up appointments reviewed: PCP Follow-up appointment confirmed?: NA Specialist Hospital Follow-up appointment confirmed?: Yes Date of Specialist follow-up appointment?: 07/11/23 Follow-Up Specialty Provider:: Dr Christeen Douglas Do you need transportation to your follow-up appointment?: No Do you understand care options if your condition(s) worsen?: Yes-patient verbalized understanding    SIGNATURE Lewanda Rife, LPN

## 2023-07-20 ENCOUNTER — Encounter
Admission: RE | Admit: 2023-07-20 | Discharge: 2023-07-20 | Disposition: A | Discharge status: Home / Self Care | Payer: 59 | Source: Ambulatory Visit | Attending: Obstetrics and Gynecology | Admitting: Obstetrics and Gynecology

## 2023-07-20 VITALS — Ht 63.0 in | Wt 210.1 lb

## 2023-07-20 DIAGNOSIS — Z01818 Encounter for other preprocedural examination: Secondary | ICD-10-CM

## 2023-07-20 HISTORY — DX: Alcohol abuse, in remission: F10.11

## 2023-07-20 HISTORY — DX: Scoliosis, unspecified: M41.9

## 2023-07-20 HISTORY — DX: Morbid (severe) obesity due to excess calories: E66.01

## 2023-07-20 HISTORY — DX: Tachycardia, unspecified: R00.0

## 2023-07-20 HISTORY — DX: Bariatric surgery status: Z98.84

## 2023-07-20 HISTORY — DX: Pelvic and perineal pain: R10.2

## 2023-07-20 HISTORY — DX: Pelvic and perineal pain unspecified side: R10.20

## 2023-07-20 HISTORY — DX: Unspecified ovarian cyst, unspecified side: N83.209

## 2023-07-20 HISTORY — DX: Unspecified osteoarthritis, unspecified site: M19.90

## 2023-07-20 HISTORY — DX: Hepatomegaly, not elsewhere classified: R16.0

## 2023-07-20 NOTE — Patient Instructions (Addendum)
Your procedure is scheduled on:07-28-23 Thursday Report to the Registration Desk on the 1st floor of the [facility].Then proceed to the 2nd floor Surgery Desk To find out your arrival time, please call [phone] between 1PM - 3PM on:07-27-23 Wednesday If your arrival time is 6:00 am, do not arrive before that time as the [facility] entrance doors do not open until 6:00 am.  REMEMBER: Instructions that are not followed completely may result in serious medical risk, up to and including death; or upon the discretion of your surgeon and anesthesiologist your surgery may need to be rescheduled.  Do not eat food after midnight the night before surgery.  No gum chewing or hard candies.  You may however, drink CLEAR liquids up to 2 hours before you are scheduled to arrive for your surgery. Do not drink anything within 2 hours of your scheduled arrival time.  Clear liquids include: - water  - apple juice without pulp - gatorade (not RED colors) - black coffee or tea (Do NOT add milk or creamers to the coffee or tea) Do NOT drink anything that is not on this list.  One week prior to surgery: Stop Anti-inflammatories (NSAIDS) such as Advil, Aleve, Ibuprofen, Motrin, Naproxen, Naprosyn and Aspirin based products such as Excedrin, Goody's Powder, BC Powder.You may however, take Tylenol/Percocet if needed for pain up until the day of surgery. Stop ANY OVER THE COUNTER supplements/vitamins NOW (07-20-23) until after surgery.   Continue taking all prescribed medications   TAKE ONLY THESE MEDICATIONS THE MORNING OF SURGERY WITH A SIP OF WATER: -amLODipine (NORVASC)  -venlafaxine XR (EFFEXOR XR)  -You may take LORazepam (ATIVAN) if needed for anxiety  No Alcohol for 24 hours before or after surgery.  No Smoking including e-cigarettes for 24 hours before surgery.  No chewable tobacco products for at least 6 hours before surgery.  No nicotine patches on the day of surgery.  Do not use any  "recreational" drugs for at least a week (preferably 2 weeks) before your surgery.  Please be advised that the combination of cocaine and anesthesia may have negative outcomes, up to and including death. If you test positive for cocaine, your surgery will be cancelled.  On the morning of surgery brush your teeth with toothpaste and water, you may rinse your mouth with mouthwash if you wish. Do not swallow any toothpaste or mouthwash.  Use CHG Soap as directed on instruction sheet.  Do not wear jewelry, make-up, hairpins, clips or nail polish.  For welded (permanent) jewelry: bracelets, anklets, waist bands, etc.  Please have this removed prior to surgery.  If it is not removed, there is a chance that hospital personnel will need to cut it off on the day of surgery.  Do not wear lotions, powders, or perfumes.   Do not shave body hair from the neck down 48 hours before surgery.  Contact lenses, hearing aids and dentures may not be worn into surgery.  Do not bring valuables to the hospital. [facility] is not responsible for any missing/lost belongings or valuables.   Notify your doctor if there is any change in your medical condition (cold, fever, infection).  Wear comfortable clothing (specific to your surgery type) to the hospital.  After surgery, you can help prevent lung complications by doing breathing exercises.  Take deep breaths and cough every 1-2 hours. Your doctor may order a device called an Incentive Spirometer to help you take deep breaths. When coughing or sneezing, hold a pillow firmly  against your incision with both hands. This is called "splinting." Doing this helps protect your incision. It also decreases belly discomfort.  If you are being admitted to the hospital overnight, leave your suitcase in the car. After surgery it may be brought to your room.  In case of increased patient census, it may be necessary for you, the patient, to continue your postoperative care in  the Same Day Surgery department.  If you are being discharged the day of surgery, you will not be allowed to drive home. You will need a responsible individual to drive you home and stay with you for 24 hours after surgery.   If you are taking public transportation, you will need to have a responsible individual with you.  Please call the Pre-admissions Testing Dept. at [phone] if you have any questions about these instructions.  Surgery Visitation Policy:  Patients having surgery or a procedure may have two visitors.  Children under the age of 74 must have an adult with them who is not the patient.     Preparing for Surgery with CHLORHEXIDINE GLUCONATE (CHG) Soap  Chlorhexidine Gluconate (CHG) Soap  o An antiseptic cleaner that kills germs and bonds with the skin to continue killing germs even after washing  o Used for showering the night before surgery and morning of surgery  Before surgery, you can play an important role by reducing the number of germs on your skin.  CHG (Chlorhexidine gluconate) soap is an antiseptic cleanser which kills germs and bonds with the skin to continue killing germs even after washing.  Please do not use if you have an allergy to CHG or antibacterial soaps. If your skin becomes reddened/irritated stop using the CHG.  1. Shower the NIGHT BEFORE SURGERY and the MORNING OF SURGERY with CHG soap.  2. If you choose to wash your hair, wash your hair first as usual with your normal shampoo.  3. After shampooing, rinse your hair and body thoroughly to remove the shampoo.  4. Use CHG as you would any other liquid soap. You can apply CHG directly to the skin and wash gently with a scrungie or a clean washcloth.  5. Apply the CHG soap to your body only from the neck down. Do not use on open wounds or open sores. Avoid contact with your eyes, ears, mouth, and genitals (private parts). Wash face and genitals (private parts) with your normal soap.  6.  Wash thoroughly, paying special attention to the area where your surgery will be performed.  7. Thoroughly rinse your body with warm water.  8. Do not shower/wash with your normal soap after using and rinsing off the CHG soap.  9. Pat yourself dry with a clean towel.  10. Wear clean pajamas to bed the night before surgery.  12. Place clean sheets on your bed the night of your first shower and do not sleep with pets.  13. Shower again with the CHG soap on the day of surgery prior to arriving at the hospital.  14. Do not apply any deodorants/lotions/powders.  15. Please wear clean clothes to the hospital.  How to Use an Incentive Spirometer An incentive spirometer is a tool that measures how well you are filling your lungs with each breath. Learning to take long, deep breaths using this tool can help you keep your lungs clear and active. This may help to reverse or lessen your chance of developing breathing (pulmonary) problems, especially infection. You may be asked to use a  spirometer: After a surgery. If you have a lung problem or a history of smoking. After a long period of time when you have been unable to move or be active. If the spirometer includes an indicator to show the highest number that you have reached, your health care provider or respiratory therapist will help you set a goal. Keep a log of your progress as told by your health care provider. What are the risks? Breathing too quickly may cause dizziness or cause you to pass out. Take your time so you do not get dizzy or light-headed. If you are in pain, you may need to take pain medicine before doing incentive spirometry. It is harder to take a deep breath if you are having pain. How to use your incentive spirometer  Sit up on the edge of your bed or on a chair. Hold the incentive spirometer so that it is in an upright position. Before you use the spirometer, breathe out normally. Place the mouthpiece in your mouth. Make  sure your lips are closed tightly around it. Breathe in slowly and as deeply as you can through your mouth, causing the piston or the ball to rise toward the top of the chamber. Hold your breath for 3-5 seconds, or for as long as possible. If the spirometer includes a coach indicator, use this to guide you in breathing. Slow down your breathing if the indicator goes above the marked areas. Remove the mouthpiece from your mouth and breathe out normally. The piston or ball will return to the bottom of the chamber. Rest for a few seconds, then repeat the steps 10 or more times. Take your time and take a few normal breaths between deep breaths so that you do not get dizzy or light-headed. Do this every 1-2 hours when you are awake. If the spirometer includes a goal marker to show the highest number you have reached (best effort), use this as a goal to work toward during each repetition. After each set of 10 deep breaths, cough a few times. This will help to make sure that your lungs are clear. If you have an incision on your chest or abdomen from surgery, place a pillow or a rolled-up towel firmly against the incision when you cough. This can help to reduce pain while taking deep breaths and coughing. General tips When you are able to get out of bed: Walk around often. Continue to take deep breaths and cough in order to clear your lungs. Keep using the incentive spirometer until your health care provider says it is okay to stop using it. If you have been in the hospital, you may be told to keep using the spirometer at home. Contact a health care provider if: You are having difficulty using the spirometer. You have trouble using the spirometer as often as instructed. Your pain medicine is not giving enough relief for you to use the spirometer as told. You have a fever. Get help right away if: You develop shortness of breath. You develop a cough with bloody mucus from the lungs. You have fluid or  blood coming from an incision site after you cough. Summary An incentive spirometer is a tool that can help you learn to take long, deep breaths to keep your lungs clear and active. You may be asked to use a spirometer after a surgery, if you have a lung problem or a history of smoking, or if you have been inactive for a long period of time. Use your  incentive spirometer as instructed every 1-2 hours while you are awake. If you have an incision on your chest or abdomen, place a pillow or a rolled-up towel firmly against your incision when you cough. This will help to reduce pain. Get help right away if you have shortness of breath, you cough up bloody mucus, or blood comes from your incision when you cough. This information is not intended to replace advice given to you by your health care provider. Make sure you discuss any questions you have with your health care provider. Document Revised: 12/24/2019 Document Reviewed: 12/24/2019 Elsevier Patient Education  2024 [employer].

## 2023-07-20 NOTE — H&P (Signed)
Valerie Morales is a 47 y.o. female here for Pre Op Consulting (New pt surgical consult referred by KP - ovarian cyst - seen in ED on 9/9 and had repeat u/s in office on 9/13, pain on left side that restarted since Thursday but overall has been for about a month on and off)   Referring provider: Raynaldo Opitz   History of Present Illness: Patient new to me, established at the clinic. She returns today with pelvic pain. Hemorrhagic LO cyst present since 04/2023. Persistent in size. She saw our NP on 06/07/23 and discussed surgical management of her cyst. She returns today to further discuss this option.      Today:     Location:  left pelvis Quality:  pain Severity:  moderate - severe  Duration:  1 month Timing:  intermittent & constant  Context:  Pain 7/10 today, both sharp intermittently and dull constantly. Wraps to her back. There is not one specific part that hurts but it is her whole pelvis.  Modifying factors:  nothing makes better or worse. She does have oxycodone but tries not to take this as it makes her sleepy Associated signs and symptoms:  Denies bleeding, discharge. No dysuria. No dyspareunia      TVUS 07/01/23  Uterus 7.3 x 4.4 x 3.5 cm  Endometrium 5 mm  RO wnl  LO with hemorrhagic cyst 3.9 cm, complex    Pertinent Hx: - Cesarean x 3  - BTL  - s/p endometrial ablation    - Last pap 07/2019 neg/neg    Past Medical History:  has a past medical history of Anemia (2009), Anxiety, Depression, History of abnormal cervical Pap smear, History of blood transfusion (2008), Hyperlipidemia (2022), Hypertension (2020), Liver disease (2022), and Ovarian cyst.  Past Surgical History:  has a past surgical history that includes Bariatric Surgery; Cesarean section (1995, 1996, 1998); Endometrial ablation (2009); Cholecystectomy (1998); and Tubal ligation (1998). Family History: family history includes Breast cancer in her maternal aunt and paternal grandmother; Cancer  in her mother; Diabetes in her mother; Heart disease in her mother; High blood pressure (Hypertension) in her mother; Thyroid disease in her mother. Social History:  reports that she has never smoked. She has never used smokeless tobacco. She reports current alcohol use of about 4.0 standard drinks of alcohol per week. She reports that she does not currently use drugs. OB/GYN History:  OB History       Gravida  4   Para  3   Term  3   Preterm      AB  1   Living  3        SAB      IAB  1   Ectopic      Molar      Multiple      Live Births              Allergies: is allergic to other, oxycodone-acetaminophen, penicillins, atorvastatin, mold, zolpidem, aspirin, and latex. Medications: Current Medications  Current Outpatient Medications:    amLODIPine (NORVASC) 5 MG tablet, Take 5 mg by mouth once daily, Disp: , Rfl: 1   cyclobenzaprine (FLEXERIL) 5 MG tablet, Take 5 mg by mouth 3 (three) times daily as needed, Disp: , Rfl:    EPINEPHrine (EPIPEN) 0.3 mg/0.3 mL auto-injector, , Disp: , Rfl:    hydrOXYzine pamoate (VISTARIL) 25 MG capsule, TAKE 1 CAPSULE BY MOUTH AS NEEDED FOR ANXIETY AND FOR SLEEP, Disp: , Rfl:  ibuprofen (MOTRIN) 800 MG tablet, Take 800 mg by mouth every 8 (eight) hours as needed, Disp: , Rfl:    LORazepam (ATIVAN) 0.5 MG tablet, , Disp: , Rfl:    olmesartan-hydrochlorothiazide (BENICAR HCT) 20-12.5 mg tablet, , Disp: , Rfl:    ondansetron (ZOFRAN) 4 MG tablet, TAKE 1 TABLET BY MOUTH EVERY 8 HOURS AS NEEDED FOR NAUSEA FOR VOMITING, Disp: , Rfl:    oxyCODONE-acetaminophen (PERCOCET) 5-325 mg tablet, TAKE 1 TABLET BY MOUTH EVERY 8 HOURS AS NEEDED FOR UP TO 5 DAYS FOR SEVERE PAIN, Disp: , Rfl:    venlafaxine (EFFEXOR) 37.5 MG tablet, Take 37.5 mg by mouth 2 (two) times daily, Disp: , Rfl:    venlafaxine (EFFEXOR-XR) 150 MG XR capsule, TAKE 1 CAPSULE BY MOUTH ONCE DAILY WITH BREAKFAST, Disp: , Rfl:    cetirizine (ZYRTEC) 10 MG tablet, Take 10 mg by mouth  at bedtime (Patient not taking: Reported on 07/11/2023), Disp: , Rfl:    cholecalciferol, vitamin D3, (CHOLECALCIFEROL, VIT D3,,BULK,) 100,000 unit/gram Powd, Take 2 tablets by mouth once daily (Patient not taking: Reported on 07/11/2023), Disp: , Rfl:    ergocalciferol, vitamin D2, 1,250 mcg (50,000 unit) capsule, TAKE 1 CAPSULE BY MOUTH ONCE A WEEK. OFFICE VISIT NEEDED FOR FURTHER REFILLS. (Patient not taking: Reported on 07/11/2023), Disp: , Rfl:    famotidine (PEPCID) 20 MG tablet, Take 20 mg by mouth 2 (two) times daily (Patient not taking: Reported on 07/11/2023), Disp: , Rfl:    gabapentin (NEURONTIN) 100 MG capsule, Take 1 capsule (100 mg total) by mouth at bedtime (Patient not taking: Reported on 07/11/2023), Disp: 30 capsule, Rfl: 1   meloxicam (MOBIC) 15 MG tablet, Take 15 mg by mouth once daily (Patient not taking: Reported on 07/11/2023), Disp: , Rfl:      Review of Systems: No SOB, no palpitations or chest pain, no new lower extremity edema, no nausea or vomiting or bowel or bladder complaints. See HPI for gyn specific ROS.    Exam:    BP 136/86   Ht 160 cm (5\' 3" )   Wt 95.3 kg (210 lb)   BMI 37.20 kg/m     Constitutional:  General appearance: Well nourished, well developed female in no acute distress.  Neuro/psych:  Normal mood and affect. No gross motor deficits. Neck:  Supple, normal appearance.  Respiratory:  Normal respiratory effort, no use of accessory muscles Skin:  No visible rashes or external lesions    Pelvic:              External genitalia: vulva and labia without lesions, Tanner stage 5             Urethra: no prolapse             Vagina: normal physiologic d/c             Cervix: no lesions, no cervical motion tenderness               Uterus: normal size shape and contour, non-tender             Adnexa: no mass,  non-tender               Rectovaginal: external exam normal    Pap today    A female chaperone was present for the more sensitive portions of the  physical exam ( such as breast and pelvic)       Impression:    The primary encounter diagnosis was Pelvic pain. Diagnoses of  Hemorrhagic cyst of ovary and Screening for cervical cancer were also pertinent to this visit.   Plan:    - Pelvic pain: I have reviewed the patient's history and physical exam.  I reviewed the differential diagnosis of pelvic pain, including GYN, urologic, gastrointestinal, musculoskeletal, neurological and psychological causes.  After discussion of her pain and her options, she would like a hysterectomy with BSO. She is already experiencing menopausal sx.    She is sure a hysterectomy is what she wants next. She has tried various pain medications and wants her pain to stop now as soon as possible.    Will plan for RA TLH/BSO with cystectomy and possible lysis of adhesions.    We discussed her increased risk of scar tissue with hx of 3 cesarean deliveries.    - Menopausal Sx:  We also discussed risk of surgical menopause and estrogen therapy following.  We reviewed her previous labs in 2021 that showed she was not yet postmenopausal, so estrogen would not be a contraindication  She will consider this option and let me know.

## 2023-07-22 ENCOUNTER — Inpatient Hospital Stay: Admission: RE | Admit: 2023-07-22 | Payer: 59 | Source: Ambulatory Visit

## 2023-07-22 ENCOUNTER — Encounter
Admission: RE | Admit: 2023-07-22 | Discharge: 2023-07-22 | Disposition: A | Discharge status: Home / Self Care | Payer: 59 | Source: Ambulatory Visit | Attending: Obstetrics and Gynecology | Admitting: Obstetrics and Gynecology

## 2023-07-22 DIAGNOSIS — R102 Pelvic and perineal pain: Secondary | ICD-10-CM | POA: Diagnosis not present

## 2023-07-22 DIAGNOSIS — Z01812 Encounter for preprocedural laboratory examination: Secondary | ICD-10-CM | POA: Insufficient documentation

## 2023-07-22 DIAGNOSIS — Z01818 Encounter for other preprocedural examination: Secondary | ICD-10-CM | POA: Diagnosis present

## 2023-07-22 LAB — TYPE AND SCREEN
ABO/RH(D): B NEG
Antibody Screen: NEGATIVE

## 2023-07-22 LAB — CBC
HCT: 39.1 % (ref 36.0–46.0)
Hemoglobin: 13.7 g/dL (ref 12.0–15.0)
MCH: 33.7 pg (ref 26.0–34.0)
MCHC: 35 g/dL (ref 30.0–36.0)
MCV: 96.1 fL (ref 80.0–100.0)
Platelets: 301 10*3/uL (ref 150–400)
RBC: 4.07 MIL/uL (ref 3.87–5.11)
RDW: 14.4 % (ref 11.5–15.5)
WBC: 6.8 10*3/uL (ref 4.0–10.5)
nRBC: 0 % (ref 0.0–0.2)

## 2023-07-22 LAB — BASIC METABOLIC PANEL
Anion gap: 9 (ref 5–15)
BUN: 7 mg/dL (ref 6–20)
CO2: 26 mmol/L (ref 22–32)
Calcium: 9 mg/dL (ref 8.9–10.3)
Chloride: 101 mmol/L (ref 98–111)
Creatinine, Ser: 0.7 mg/dL (ref 0.44–1.00)
GFR, Estimated: >60 mL/min (ref >60)
Glucose, Bld: 94 mg/dL (ref 70–99)
Potassium: 3.4 mmol/L — ABNORMAL LOW (ref 3.5–5.1)
Sodium: 136 mmol/L (ref 135–145)

## 2023-07-26 ENCOUNTER — Other Ambulatory Visit: Payer: Self-pay | Admitting: Obstetrics and Gynecology

## 2023-07-27 MED ORDER — POVIDONE-IODINE 10 % EX SWAB
2.0000 | Freq: Once | CUTANEOUS | Status: AC
  Administered 2023-07-28: 2 via TOPICAL

## 2023-07-27 MED ORDER — CEFAZOLIN SODIUM-DEXTROSE 2-4 GM/100ML-% IV SOLN
2.0000 g | INTRAVENOUS | Status: AC
  Administered 2023-07-28: 2 g via INTRAVENOUS

## 2023-07-27 MED ORDER — CHLORHEXIDINE GLUCONATE 0.12 % MT SOLN
15.0000 mL | Freq: Once | OROMUCOSAL | Status: AC
  Administered 2023-07-28: 15 mL via OROMUCOSAL

## 2023-07-27 MED ORDER — ORAL CARE MOUTH RINSE: 15.0000 mL | Freq: Once | OROMUCOSAL | Status: AC

## 2023-07-27 MED ORDER — LACTATED RINGERS IV SOLN: INTRAVENOUS | Status: DC

## 2023-07-27 MED ORDER — GABAPENTIN 300 MG PO CAPS
300.0000 mg | ORAL_CAPSULE | ORAL | Status: AC
  Administered 2023-07-28: 300 mg via ORAL

## 2023-07-27 MED ORDER — FAMOTIDINE 20 MG PO TABS
20.0000 mg | ORAL_TABLET | Freq: Once | ORAL | Status: AC
  Administered 2023-07-28: 20 mg via ORAL

## 2023-07-28 ENCOUNTER — Encounter
Admission: RE | Disposition: A | Discharge status: Home / Self Care | Payer: Self-pay | Source: Home / Self Care | Attending: Obstetrics and Gynecology

## 2023-07-28 ENCOUNTER — Ambulatory Visit
Admission: RE | Admit: 2023-07-28 | Discharge: 2023-07-28 | Disposition: A | Discharge status: Home / Self Care | Payer: 59 | Attending: Obstetrics and Gynecology | Admitting: Obstetrics and Gynecology

## 2023-07-28 ENCOUNTER — Other Ambulatory Visit: Payer: Self-pay

## 2023-07-28 ENCOUNTER — Encounter: Payer: Self-pay | Admitting: Obstetrics and Gynecology

## 2023-07-28 ENCOUNTER — Ambulatory Visit: Payer: 59 | Admitting: Urgent Care

## 2023-07-28 ENCOUNTER — Ambulatory Visit: Payer: 59 | Admitting: Certified Registered"

## 2023-07-28 DIAGNOSIS — Z01818 Encounter for other preprocedural examination: Secondary | ICD-10-CM

## 2023-07-28 DIAGNOSIS — N838 Other noninflammatory disorders of ovary, fallopian tube and broad ligament: Secondary | ICD-10-CM | POA: Diagnosis not present

## 2023-07-28 DIAGNOSIS — N888 Other specified noninflammatory disorders of cervix uteri: Secondary | ICD-10-CM | POA: Diagnosis not present

## 2023-07-28 DIAGNOSIS — N8301 Follicular cyst of right ovary: Secondary | ICD-10-CM | POA: Diagnosis not present

## 2023-07-28 DIAGNOSIS — F419 Anxiety disorder, unspecified: Secondary | ICD-10-CM | POA: Insufficient documentation

## 2023-07-28 DIAGNOSIS — F32A Depression, unspecified: Secondary | ICD-10-CM | POA: Insufficient documentation

## 2023-07-28 DIAGNOSIS — G4730 Sleep apnea, unspecified: Secondary | ICD-10-CM | POA: Diagnosis not present

## 2023-07-28 DIAGNOSIS — I10 Essential (primary) hypertension: Secondary | ICD-10-CM | POA: Insufficient documentation

## 2023-07-28 DIAGNOSIS — N8302 Follicular cyst of left ovary: Secondary | ICD-10-CM | POA: Insufficient documentation

## 2023-07-28 DIAGNOSIS — Z9884 Bariatric surgery status: Secondary | ICD-10-CM | POA: Diagnosis not present

## 2023-07-28 DIAGNOSIS — D252 Subserosal leiomyoma of uterus: Secondary | ICD-10-CM | POA: Diagnosis not present

## 2023-07-28 DIAGNOSIS — R102 Pelvic and perineal pain: Secondary | ICD-10-CM | POA: Insufficient documentation

## 2023-07-28 HISTORY — PX: ROBOTIC ASSISTED TOTAL HYSTERECTOMY WITH BILATERAL SALPINGO OOPHERECTOMY: SHX6086

## 2023-07-28 HISTORY — PX: CYSTOSCOPY: SHX5120

## 2023-07-28 LAB — POCT PREGNANCY, URINE: Preg Test, Ur: NEGATIVE

## 2023-07-28 SURGERY — HYSTERECTOMY, TOTAL, ROBOT-ASSISTED, LAPAROSCOPIC, WITH BILATERAL SALPINGO-OOPHORECTOMY
Anesthesia: General | Site: Abdomen

## 2023-07-28 MED ORDER — FENTANYL CITRATE (PF) 100 MCG/2ML IJ SOLN: 25.0000 ug | INTRAMUSCULAR | Status: DC | PRN

## 2023-07-28 MED ORDER — CHLORHEXIDINE GLUCONATE 0.12 % MT SOLN
OROMUCOSAL | Status: AC
  Filled 2023-07-28: qty 15

## 2023-07-28 MED ORDER — 0.9 % SODIUM CHLORIDE (POUR BTL) OPTIME
TOPICAL | Status: DC | PRN
  Administered 2023-07-28: 500 mL

## 2023-07-28 MED ORDER — PROPOFOL 10 MG/ML IV BOLUS
INTRAVENOUS | Status: AC
  Filled 2023-07-28: qty 20

## 2023-07-28 MED ORDER — PROPOFOL 10 MG/ML IV BOLUS
INTRAVENOUS | Status: DC | PRN
  Administered 2023-07-28: 200 mg via INTRAVENOUS

## 2023-07-28 MED ORDER — MIDAZOLAM HCL 2 MG/2ML IJ SOLN
INTRAMUSCULAR | Status: AC
  Filled 2023-07-28: qty 2

## 2023-07-28 MED ORDER — FLUORESCEIN SODIUM 10 % IV SOLN
INTRAVENOUS | Status: AC
  Filled 2023-07-28: qty 5

## 2023-07-28 MED ORDER — FENTANYL CITRATE (PF) 100 MCG/2ML IJ SOLN
INTRAMUSCULAR | Status: AC
  Filled 2023-07-28: qty 2

## 2023-07-28 MED ORDER — KETAMINE HCL 50 MG/5ML IJ SOSY
PREFILLED_SYRINGE | INTRAMUSCULAR | Status: AC
  Filled 2023-07-28: qty 5

## 2023-07-28 MED ORDER — ONDANSETRON HCL 4 MG/2ML IJ SOLN
INTRAMUSCULAR | Status: DC | PRN
  Administered 2023-07-28: 4 mg via INTRAVENOUS

## 2023-07-28 MED ORDER — BUPIVACAINE HCL (PF) 0.5 % IJ SOLN
INTRAMUSCULAR | Status: AC
  Filled 2023-07-28: qty 30

## 2023-07-28 MED ORDER — IBUPROFEN 800 MG PO TABS
800.0000 mg | ORAL_TABLET | Freq: Three times a day (TID) | ORAL | 1 refills | Status: AC
Start: 2023-07-28 — End: 2023-07-31

## 2023-07-28 MED ORDER — DEXMEDETOMIDINE HCL IN NACL 80 MCG/20ML IV SOLN
INTRAVENOUS | Status: DC | PRN
Start: 2023-07-28 — End: 2023-07-28
  Administered 2023-07-28: 8 ug via INTRAVENOUS
  Administered 2023-07-28: 12 ug via INTRAVENOUS

## 2023-07-28 MED ORDER — GABAPENTIN 300 MG PO CAPS
ORAL_CAPSULE | ORAL | Status: AC
  Filled 2023-07-28: qty 1

## 2023-07-28 MED ORDER — ALBUTEROL SULFATE HFA 108 (90 BASE) MCG/ACT IN AERS
INHALATION_SPRAY | RESPIRATORY_TRACT | Status: DC | PRN
Start: 2023-07-28 — End: 2023-07-28
  Administered 2023-07-28: 6 via RESPIRATORY_TRACT

## 2023-07-28 MED ORDER — HYDROMORPHONE HCL 1 MG/ML IJ SOLN
INTRAMUSCULAR | Status: DC | PRN
Start: 2023-07-28 — End: 2023-07-28
  Administered 2023-07-28: .5 mg via INTRAVENOUS

## 2023-07-28 MED ORDER — SUGAMMADEX SODIUM 200 MG/2ML IV SOLN
INTRAVENOUS | Status: DC | PRN
  Administered 2023-07-28: 200 mg via INTRAVENOUS

## 2023-07-28 MED ORDER — FENTANYL CITRATE (PF) 100 MCG/2ML IJ SOLN
INTRAMUSCULAR | Status: DC | PRN
  Administered 2023-07-28: 100 ug via INTRAVENOUS

## 2023-07-28 MED ORDER — SODIUM CHLORIDE 0.9% FLUSH: 10.0000 mL | Freq: Two times a day (BID) | INTRAVENOUS | Status: DC

## 2023-07-28 MED ORDER — ROCURONIUM BROMIDE 100 MG/10ML IV SOLN
INTRAVENOUS | Status: DC | PRN
  Administered 2023-07-28 (×2): 50 mg via INTRAVENOUS

## 2023-07-28 MED ORDER — CEFAZOLIN SODIUM-DEXTROSE 2-4 GM/100ML-% IV SOLN
INTRAVENOUS | Status: AC
  Filled 2023-07-28: qty 100

## 2023-07-28 MED ORDER — HYDROMORPHONE HCL 1 MG/ML IJ SOLN
INTRAMUSCULAR | Status: AC
  Filled 2023-07-28: qty 1

## 2023-07-28 MED ORDER — KETAMINE HCL 10 MG/ML IJ SOLN
INTRAMUSCULAR | Status: DC | PRN
Start: 2023-07-28 — End: 2023-07-28
  Administered 2023-07-28: 30 mg via INTRAVENOUS
  Administered 2023-07-28: 20 mg via INTRAVENOUS

## 2023-07-28 MED ORDER — KETOROLAC TROMETHAMINE 30 MG/ML IJ SOLN
INTRAMUSCULAR | Status: DC | PRN
  Administered 2023-07-28: 30 mg via INTRAVENOUS

## 2023-07-28 MED ORDER — OXYCODONE HCL 5 MG PO TABS
ORAL_TABLET | ORAL | Status: AC
  Filled 2023-07-28: qty 1

## 2023-07-28 MED ORDER — DEXAMETHASONE SODIUM PHOSPHATE 10 MG/ML IJ SOLN
INTRAMUSCULAR | Status: DC | PRN
  Administered 2023-07-28: 10 mg via INTRAVENOUS

## 2023-07-28 MED ORDER — BUPIVACAINE HCL (PF) 0.5 % IJ SOLN
INTRAMUSCULAR | Status: DC | PRN
  Administered 2023-07-28: 10 mL

## 2023-07-28 MED ORDER — LIDOCAINE HCL (CARDIAC) PF 100 MG/5ML IV SOSY
PREFILLED_SYRINGE | INTRAVENOUS | Status: DC | PRN
  Administered 2023-07-28: 100 mg via INTRAVENOUS

## 2023-07-28 MED ORDER — FAMOTIDINE 20 MG PO TABS
ORAL_TABLET | ORAL | Status: AC
  Filled 2023-07-28: qty 1

## 2023-07-28 MED ORDER — GABAPENTIN 800 MG PO TABS: 800.0000 mg | ORAL_TABLET | Freq: Every day | ORAL | 0 refills | Status: DC

## 2023-07-28 MED ORDER — DOCUSATE SODIUM 100 MG PO CAPS: 100.0000 mg | ORAL_CAPSULE | Freq: Two times a day (BID) | ORAL | 0 refills | Status: AC

## 2023-07-28 MED ORDER — DROPERIDOL 2.5 MG/ML IJ SOLN: 0.6250 mg | Freq: Once | INTRAMUSCULAR | Status: DC | PRN

## 2023-07-28 MED ORDER — OXYCODONE HCL 5 MG PO TABS
5.0000 mg | ORAL_TABLET | ORAL | 0 refills | Status: DC | PRN
Start: 2023-07-28 — End: 2023-09-14

## 2023-07-28 MED ORDER — OXYCODONE HCL 5 MG PO TABS
5.0000 mg | ORAL_TABLET | Freq: Once | ORAL | Status: AC
  Administered 2023-07-28: 5 mg via ORAL

## 2023-07-28 MED ORDER — MIDAZOLAM HCL 2 MG/2ML IJ SOLN
INTRAMUSCULAR | Status: DC | PRN
  Administered 2023-07-28: 2 mg via INTRAVENOUS

## 2023-07-28 SURGICAL SUPPLY — 71 items
ADH SKN CLS APL DERMABOND .7 (GAUZE/BANDAGES/DRESSINGS) ×2
APL SRG 38 LTWT LNG FL B (MISCELLANEOUS)
APPLICATOR ARISTA FLEXITIP XL (MISCELLANEOUS) IMPLANT
BAG DRN RND TRDRP ANRFLXCHMBR (UROLOGICAL SUPPLIES) ×2
BAG URINE DRAIN 2000ML AR STRL (UROLOGICAL SUPPLIES) ×2 IMPLANT
BLADE SURG SZ11 CARB STEEL (BLADE) ×2 IMPLANT
CANNULA CAP OBTURATR AIRSEAL 8 (CAP) ×2 IMPLANT
CATH FOLEY 2WAY 5CC 16FR (CATHETERS) ×2
CATH ROBINSON RED A/P 16FR (CATHETERS) ×2 IMPLANT
CATH URTH 16FR FL 2W BLN LF (CATHETERS) ×2 IMPLANT
COUNTER NEEDLE 20/40 LG (NEEDLE) ×2 IMPLANT
COVER TIP SHEARS 8 DVNC (MISCELLANEOUS) ×2 IMPLANT
DERMABOND ADVANCED .7 DNX12 (GAUZE/BANDAGES/DRESSINGS) ×2 IMPLANT
DRAPE ARM DVNC X/XI (DISPOSABLE) ×8 IMPLANT
DRAPE COLUMN DVNC XI (DISPOSABLE) ×2 IMPLANT
DRAPE ROBOT W/ LEGGING 30X125 (DRAPES) ×2 IMPLANT
DRAPE SHEET LG 3/4 BI-LAMINATE (DRAPES) ×2 IMPLANT
DRIVER NDL MEGA 8 DVNC XI (INSTRUMENTS) ×2 IMPLANT
DRIVER NDLE MEGA DVNC XI (INSTRUMENTS) ×2
ELECT REM PT RETURN 9FT ADLT (ELECTROSURGICAL) ×2
ELECTRODE REM PT RTRN 9FT ADLT (ELECTROSURGICAL) ×2 IMPLANT
FORCEPS BPLR FENES DVNC XI (FORCEP) ×2 IMPLANT
GAUZE 4X4 16PLY ~~LOC~~+RFID DBL (SPONGE) ×2 IMPLANT
GLOVE BIO SURGEON STRL SZ7 (GLOVE) ×8 IMPLANT
GLOVE INDICATOR 7.5 STRL GRN (GLOVE) ×8 IMPLANT
GOWN STRL REUS W/ TWL LRG LVL3 (GOWN DISPOSABLE) ×12 IMPLANT
GOWN STRL REUS W/TWL LRG LVL3 (GOWN DISPOSABLE) ×12
HEMOSTAT ARISTA ABSORB 3G PWDR (HEMOSTASIS) IMPLANT
IRRIGATION STRYKERFLOW (MISCELLANEOUS) IMPLANT
IRRIGATOR STRYKERFLOW (MISCELLANEOUS) ×2
IV LACTATED RINGERS 1000ML (IV SOLUTION) ×2 IMPLANT
IV NS 1000ML (IV SOLUTION)
IV NS 1000ML BAXH (IV SOLUTION) IMPLANT
IV NS 500ML (IV SOLUTION) ×2
IV NS 500ML BAXH (IV SOLUTION) IMPLANT
KIT PINK PAD W/HEAD ARE REST (MISCELLANEOUS) ×2
KIT PINK PAD W/HEAD ARM REST (MISCELLANEOUS) ×2 IMPLANT
LABEL OR SOLS (LABEL) ×2 IMPLANT
MANIFOLD NEPTUNE II (INSTRUMENTS) ×2 IMPLANT
MANIPULATOR VCARE LG CRV RETR (MISCELLANEOUS) IMPLANT
MANIPULATOR VCARE SML CRV RETR (MISCELLANEOUS) IMPLANT
MANIPULATOR VCARE STD CRV RETR (MISCELLANEOUS) IMPLANT
NS IRRIG 1000ML POUR BTL (IV SOLUTION) ×2 IMPLANT
NS IRRIG 500ML POUR BTL (IV SOLUTION) ×2 IMPLANT
OBTURATOR OPTICAL STND 8 DVNC (TROCAR) ×2
OBTURATOR OPTICALSTD 8 DVNC (TROCAR) ×2 IMPLANT
OCCLUDER COLPOPNEUMO (BALLOONS) ×2 IMPLANT
PACK GYN LAPAROSCOPIC (MISCELLANEOUS) ×2 IMPLANT
PAD OB MATERNITY 4.3X12.25 (PERSONAL CARE ITEMS) ×2 IMPLANT
PAD PREP OB/GYN DISP 24X41 (PERSONAL CARE ITEMS) ×2 IMPLANT
SCISSORS MNPLR CVD DVNC XI (INSTRUMENTS) ×2 IMPLANT
SCRUB CHG 4% DYNA-HEX 4OZ (MISCELLANEOUS) ×2 IMPLANT
SEAL UNIV 5-12 XI (MISCELLANEOUS) ×6 IMPLANT
SEALER VESSEL EXT DVNC XI (MISCELLANEOUS) ×2 IMPLANT
SET CYSTO W/LG BORE CLAMP LF (SET/KITS/TRAYS/PACK) ×2 IMPLANT
SET TUBE FILTERED XL AIRSEAL (SET/KITS/TRAYS/PACK) ×2 IMPLANT
SOL ELECTROSURG ANTI STICK (MISCELLANEOUS) ×2
SOL PREP PVP 2OZ (MISCELLANEOUS) ×2
SOLUTION ELECTROSURG ANTI STCK (MISCELLANEOUS) ×2 IMPLANT
SOLUTION PREP PVP 2OZ (MISCELLANEOUS) ×2 IMPLANT
SURGILUBE 2OZ TUBE FLIPTOP (MISCELLANEOUS) ×2 IMPLANT
SUT DVC VLOC 180 0 12IN GS21 (SUTURE) ×2
SUT MNCRL 4-0 (SUTURE) ×2
SUT MNCRL 4-0 27XMFL (SUTURE) ×2
SUT VIC AB 0 CT2 27 (SUTURE) ×4 IMPLANT
SUT VLOC 90 2/L VL 12 GS22 (SUTURE) ×2 IMPLANT
SUTURE DVC VLC 180 0 12IN GS21 (SUTURE) IMPLANT
SUTURE MNCRL 4-0 27XMF (SUTURE) ×2 IMPLANT
SYR TOOMEY 50ML (SYRINGE) IMPLANT
TRAP FLUID SMOKE EVACUATOR (MISCELLANEOUS) ×2 IMPLANT
WATER STERILE IRR 500ML POUR (IV SOLUTION) ×2 IMPLANT

## 2023-07-28 NOTE — Interval H&P Note (Signed)
History and Physical Interval Note:  07/28/2023 7:24 AM  Valerie Morales  has presented today for surgery, with the diagnosis of acute pelvic pain, hemorrhagia cyst, chronic pelvic pain.  The various methods of treatment have been discussed with the patient and family. After consideration of risks, benefits and other options for treatment, the patient has consented to  Procedure(s): XI ROBOTIC ASSISTED TOTAL HYSTERECTOMY WITH BILATERAL SALPINGO OOPHORECTOMY (Bilateral) CYSTOSCOPY (N/A) as a surgical intervention.  The patient's history has been reviewed, patient examined, no change in status, stable for surgery.  I have reviewed the patient's chart and labs.  Questions were answered to the patient's satisfaction.     Christeen Douglas

## 2023-07-28 NOTE — Discharge Instructions (Addendum)
Discharge instructions after  robotically-assisted total laparoscopic hysterectomy   For the next three days, I recommend taking ibuprofen on a schedule, every 8 hours. I also gave you gabapentin for nighttime, to help you sleep and also to control pain. Take gabapentin medicines at night for at least the next 3 nights. You also have a narcotic, oxycodone, to take as needed if the above medicines don't help.  Postop constipation is a major cause of pain. Stay well hydrated, walk as you tolerate, and take over the counter senna as well as stool softeners if you need them.  Signs and Symptoms to Report Call our office at [phone] if you have any of the following.   Fever over 100.4 degrees or higher  Severe stomach pain not relieved with pain medications  Bright red bleeding that's heavier than a period that does not slow with rest  To go the bathroom a lot (frequency), you can't hold your urine (urgency), or it hurts when you empty your bladder (urinate)  Chest pain  Shortness of breath  Pain in the calves of your legs  Severe nausea and vomiting not relieved with anti-nausea medications  Signs of infection around your wounds, such as redness, hot to touch, swelling, green/yellow drainage (like pus), bad smelling discharge  Any concerns  What You Can Expect after Surgery  You may see some pink tinged, bloody fluid and bruising around the wound. This is normal.  You may notice shoulder and neck pain. This is caused by the gas used during surgery to expand your abdomen so your surgeon could get to the uterus easier.  You may have a sore throat because of the tube in your mouth during general anesthesia. This will go away in 2 to 3 days.  You may have some stomach cramps.  You may notice spotting on your panties.  You may have pain around the incision sites.   Activities after Your Discharge Follow these guidelines to help speed your recovery at home:  Do the coughing and deep  breathing as you did in the hospital for 2 weeks. Use the small blue breathing device, called the incentive spirometer for 2 weeks.  Don't drive if you are in pain or taking narcotic pain medicine. You may drive when you can safely slam on the brakes, turn the wheel forcefully, and rotate your torso comfortably. This is typically 1-2 weeks. Practice in a parking lot or side street prior to attempting to drive regularly.   Ask others to help with household chores for 4 weeks.  Do not lift anything heavier that 10 pounds for 4-6 weeks. This includes pets, children, and groceries.  Don't do strenuous activities, exercises, or sports like vacuuming, tennis, squash, etc. until your doctor says it is safe to do so. ---Maintain pelvic rest for 12 weeks. This means nothing in the vagina or rectum at all (no douching, tampons, intercourse) for 12 weeks.   Walk as you feel able. Rest often since it may take two or three weeks for your energy level to return to normal.   You may climb stairs  Avoid constipation:   -Eat fruits, vegetables, and whole grains. Eat small meals as your appetite will take time to return to normal.   -Drink 6 to 8 glasses of water each day unless your doctor has told you to limit your fluids.   -Use a laxative or stool softener as needed if constipation becomes a problem. You may take Miralax, metamucil, Citrucil, Colace, Senekot,  FiberCon, etc. If this does not relieve the constipation, try two tablespoons of Milk Of Magnesia every 8 hours until your bowels move.   You may shower. Gently wash the wounds with a mild soap and water. Pat dry.  Do not get in a hot tub, swimming pool, etc. for 6 weeks.  Do not use lotions, oils, powders on the wounds.  Do not douche, use tampons, or have sex until your doctor says it is okay.  Take your pain medicine when you need it. The medicine may not work as well if the pain is bad.  Take the medicines you were taking before surgery. Other  medications you will need are pain medications (Norco or Percocet) and nausea medications (Zofran).    AMBULATORY SURGERY  DISCHARGE INSTRUCTIONS   The drugs that you were given will stay in your system until tomorrow so for the next 24 hours you should not:  Drive an automobile Make any legal decisions Drink any alcoholic beverage   You may resume regular meals tomorrow.  Today it is better to start with liquids and gradually work up to solid foods.  You may eat anything you prefer, but it is better to start with liquids, then soup and crackers, and gradually work up to solid foods.   Please notify your doctor immediately if you have any unusual bleeding, trouble breathing, redness and pain at the surgery site, drainage, fever, or pain not relieved by medication.    Additional Instructions:  Please contact your physician with any problems or Same Day Surgery at [phone], Monday through Friday 6 am to 4 pm, or [place] at [facility] number at [phone].

## 2023-07-28 NOTE — Anesthesia Preprocedure Evaluation (Signed)
Anesthesia Evaluation  Patient identified by MRN, date of birth, ID band Patient awake    Reviewed: Allergy & Precautions, NPO status , Patient's Chart, lab work & pertinent test results  History of Anesthesia Complications Negative for: history of anesthetic complications  Airway Mallampati: III  TM Distance: >3 FB Neck ROM: Full    Dental  (+) Missing, Dental Advidsory Given,    Pulmonary neg shortness of breath, sleep apnea , neg COPD, neg recent URI, Patient abstained from smoking.Not current smoker   Pulmonary exam normal breath sounds clear to auscultation       Cardiovascular Exercise Tolerance: Good METShypertension, Pt. on medications (-) angina (-) CAD and (-) Past MI Normal cardiovascular exam(-) dysrhythmias  Rhythm:Regular Rate:Normal - Systolic murmurs    Neuro/Psych  Headaches PSYCHIATRIC DISORDERS Anxiety Depression       GI/Hepatic Neg liver ROS,neg GERD  ,,  Endo/Other  neg diabetes  Morbid obesityS/p gastric sleeve  Renal/GU negative Renal ROS     Musculoskeletal  (+) Arthritis ,    Abdominal Normal abdominal exam  (+) + obese  Peds negative pediatric ROS (+)  Hematology  (+) Blood dyscrasia, anemia   Anesthesia Other Findings Past Medical History: No date: Anemia     Comment:  has required 2 units PRBC No date: Anxiety No date: Chicken pox No date: Depression     Comment:  trazadone has not helped in the past  No date: Fatty liver No date: Headache 2009: History of blood transfusion     Comment:  [place] No date: Hypertension No date: MRSA (methicillin resistant Staphylococcus aureus) No date: Sinusitis No date: Sinusitis, chronic No date: UTI (urinary tract infection) No date: Vitamin D deficiency  Reproductive/Obstetrics                             Anesthesia Physical Anesthesia Plan  ASA: III  Anesthesia Plan: General   Post-op Pain Management:  Toradol IV (intra-op)* and Ofirmev IV (intra-op)*   Induction: Intravenous  PONV Risk Score and Plan: 3 and Midazolam, Dexamethasone, Ondansetron and Treatment may vary due to age or medical condition  Airway Management Planned: Video Laryngoscope Planned and Oral ETT  Additional Equipment: None  Intra-op Plan:   Post-operative Plan: Extubation in OR  Informed Consent: I have reviewed the patients History and Physical, chart, labs and discussed the procedure including the risks, benefits and alternatives for the proposed anesthesia with the patient or authorized representative who has indicated his/her understanding and acceptance.     Dental advisory given  Plan Discussed with: CRNA and Surgeon  Anesthesia Plan Comments:         Anesthesia Quick Evaluation

## 2023-07-28 NOTE — Op Note (Signed)
Valerie Morales PROCEDURE DATE: 07/28/2023  PREOPERATIVE DIAGNOSIS: Pelvic pain POSTOPERATIVE DIAGNOSIS: The same PROCEDURE:  XI ROBOTIC ASSISTED TOTAL HYSTERECTOMY WITH BILATERAL SALPINGO OOPHORECTOMY:  CYSTOSCOPY: 52000 (CPT)  SURGEON:  Dr. Christeen Douglas, MD ASSISTANT: Dr. Thomasene Mohair, MD Anesthesiologist:  Anesthesiologist: Lenard Simmer, MD CRNA: Ginger Carne, CRNA; Cheral Bay, CRNA  INDICATIONS: 47 y.o. F  here for definitive surgical management secondary to the indications listed under preoperative diagnoses; please see preoperative note for further details.  Risks of surgery were discussed with the patient including but not limited to: bleeding which may require transfusion or reoperation; infection which may require antibiotics; injury to bowel, bladder, ureters or other surrounding organs; need for additional procedures; thromboembolic phenomenon, incisional problems and other postoperative/anesthesia complications. Written informed consent was obtained.    FINDINGS:    External genitalia, vaginal canal and cervix negative for lesions. Intraoperative findings revealed a normal upper abdomen including bowel, liver, diaphragmatic surfaces, stomach, and omentum.  She did have significant cartilage tissue between the omentum and the anterior abdominal wall superior to the umbilicus and just left lateral to the falciform ligament.  However, overall her abdominal and pelvic scar tissue with minimal.  The uterus was small and mobile.  She had significant scar tissue between the bladder and the cervix at the site of her prior 3 C-sections. The right ovary and tube appeared normal.  Left ovary with multiple small hemorrhagic appearing cysts, and a normal tube.  Tubes had previously been transected in the midline.  Appendix not visualized   ANESTHESIA:    General INTRAVENOUS FLUIDS:800  ml ESTIMATED BLOOD LOSS:20 ml URINE OUTPUT: 100 ml  SPECIMENS: Uterus,  cervix, bilateral fallopian tubes and bilateral ovaries COMPLICATIONS: None immediate   RATLH/BS:  PROCEDURE IN DETAIL: After informed consent was obtained, the patient was taken to the operating room where general anesthesia was obtained without difficulty. The patient was positioned in the dorsal lithotomy position in [place] stirrups and her arms were carefully tucked at her sides and the usual precautions were taken. Deep Trendelenburg (20-25 deg) was established to confirm that she does not shift on the table.  She was prepped and draped in normal sterile fashion.  Time-out was performed and a Foley catheter was placed into the bladder. A large right VCare uterine manipulator was then placed in the uterus without incident.  Preoperative prophylactic antibiotics were given through her iv.  After infiltration of local anesthetic at the proposed trocar sites, an 8 mm incision was created at the umbilicus, and an AirSeal 5mm was placed under direct visualization, after confirmation of OG tube working well. Pneumoperitoneum was created to a pressure of 15 mm Hg. The camera was placed and the abdomin surveyed, noting intact bowel below the site of entry. A survey of the pelvis and upper abdomen revealed the above findings. One right and one left lateral 8-mm robotic ports were placed under direct visualization.  The patient was placed in deepTrendelenburg and the bowel was displaced up into the upper abdomen. The robot was left side docked. The instruments were placed under direct visualization.   The ureters were identified bilaterally coursing outside of the operative field. Round ligaments were divided on each side with the EndoShears and the retroperitoneal space was opened bilaterally. The posterior leaflet of the broad was taken down to the level of the IP ligament. The anterior leaflet of the broad ligament was carefully taken down to the midline.  A bladder flap was created and the bladder was  dissected down off the lower uterine segment and cervix using endoshears and electrocautery.    Ovariolysis was performed and the ovary was dissected medially with care to avoid the ureter.  The infundibulopelvic ligaments were skeletonized, sealed and divided. The peritoneum was taken down to the level of the internal os, and the uterine arteries skeletonized. With strong cephalad pressure from the V-care, bipolar cautery was used to seal and transect the uterine arteries, and the pedicles allowed to fall away laterally.  A colpotomy was performed circumferentially along the V-Care ring with monopolar electrocautery and the cervix was incised from the vagina using the laparoscopic scissors. The specimen was removed through the vagina.  A pneumo balloon was placed in the vagina and the vaginal cuff was then closed in a running continuous fashion using the  0 V-Lock suture with careful attention to include the vaginal cuff angles, the uterosacral ligaments and the vaginal mucosa within the closure. Bleeding noted at the left angle, and this was carefully closed to avoid the ureter and incorporate the peritoneum, closing the space around the vessel.  Hemostasis was secured with intraabdominal pressure and review of all surgical sites. The intraperitoneal pressure was dropped, and all planes of dissection, vascular pedicles and the vaginal cuff were found to be hemostatic.  The robot was undocked.   Attention was turned to the bladder and cystoscopy showed vigorous bilateral ureteral jets.  No stitches were visualized in the bladder during cystoscopy. No reason noted for her pelvic pain.  The lateral trocars were removed under visualization.  The CO2 gas was released and several deep breaths given to remove any remaining CO2 from the peritoneal cavity.  The skin incisions were closed with 4-0 Monocryl subcuticular stitch and Dermabond.    Anesthesia was reversed without difficulty.  The patient  tolerated the procedure well.  Sponge, lap and needle counts were correct x2.  The patient was taken to recovery room in excellent condition.

## 2023-07-28 NOTE — Transfer of Care (Signed)
Immediate Anesthesia Transfer of Care Note  Patient: Valerie Morales  Procedure(s) Performed: XI ROBOTIC ASSISTED TOTAL HYSTERECTOMY WITH BILATERAL SALPINGO OOPHORECTOMY (Abdomen) CYSTOSCOPY  Patient Location: PACU  Anesthesia Type:General  Level of Consciousness: awake and drowsy  Airway & Oxygen Therapy: Patient Spontanous Breathing and Patient connected to face mask oxygen  Post-op Assessment: Report given to RN and Post -op Vital signs reviewed and stable  Post vital signs: Reviewed and stable  Last Vitals:  Vitals Value Taken Time  BP 110/75 07/28/23 1000  Temp    Pulse 101 07/28/23 1000  Resp 18 07/28/23 1000  SpO2 91 % 07/28/23 1000  Vitals shown include unfiled device data.  Last Pain:  Vitals:   07/28/23 0640  TempSrc: Temporal  PainSc: 8          Complications: No notable events documented.

## 2023-07-28 NOTE — Anesthesia Procedure Notes (Signed)
Procedure Name: Intubation Date/Time: 07/28/2023 7:40 AM  Performed by: Cheral Bay, CRNAPre-anesthesia Checklist: Patient identified, Emergency Drugs available, Suction available and Patient being monitored Patient Re-evaluated:Patient Re-evaluated prior to induction Oxygen Delivery Method: Circle system utilized Preoxygenation: Pre-oxygenation with 100% oxygen Induction Type: IV induction Ventilation: Mask ventilation without difficulty Laryngoscope Size: McGraph and 3 Grade View: Grade I Tube type: Oral Tube size: 7.0 mm Number of attempts: 1 Airway Equipment and Method: Stylet Placement Confirmation: ETT inserted through vocal cords under direct vision, positive ETCO2 and breath sounds checked- equal and bilateral Tube secured with: Tape Dental Injury: Teeth and Oropharynx as per pre-operative assessment

## 2023-07-29 LAB — SURGICAL PATHOLOGY

## 2023-07-30 NOTE — Anesthesia Postprocedure Evaluation (Signed)
Anesthesia Post Note  Patient: Valerie Morales  Procedure(s) Performed: XI ROBOTIC ASSISTED TOTAL HYSTERECTOMY WITH BILATERAL SALPINGO OOPHORECTOMY (Abdomen) CYSTOSCOPY  Patient location during evaluation: PACU Anesthesia Type: General Level of consciousness: awake and alert Pain management: pain level controlled Vital Signs Assessment: post-procedure vital signs reviewed and stable Respiratory status: spontaneous breathing, nonlabored ventilation, respiratory function stable and patient connected to nasal cannula oxygen Cardiovascular status: blood pressure returned to baseline and stable Postop Assessment: no apparent nausea or vomiting Anesthetic complications: no   No notable events documented.   Last Vitals:  Vitals:   07/28/23 1045 07/28/23 1111  BP: 122/84 (!) 134/95  Pulse: 96 (!) 103  Resp: 14 16  Temp: 37.1 C (!) 36.1 C  SpO2: 93% 94%    Last Pain:  Vitals:   07/28/23 1111  TempSrc: Temporal  PainSc: 3                  Lenard Simmer

## 2023-07-31 ENCOUNTER — Encounter: Payer: Self-pay | Admitting: Obstetrics and Gynecology

## 2023-08-11 NOTE — Plan of Care (Signed)
[facility] Tonsillectomy/Adenoidectomy, Postoperative PEDS care plan entered in error.

## 2023-08-28 ENCOUNTER — Other Ambulatory Visit: Payer: Self-pay | Admitting: Family

## 2023-08-28 ENCOUNTER — Other Ambulatory Visit: Payer: Self-pay | Admitting: Nurse Practitioner

## 2023-08-28 DIAGNOSIS — G4700 Insomnia, unspecified: Secondary | ICD-10-CM

## 2023-08-28 DIAGNOSIS — R110 Nausea: Secondary | ICD-10-CM

## 2023-08-28 DIAGNOSIS — I10 Essential (primary) hypertension: Secondary | ICD-10-CM

## 2023-08-28 DIAGNOSIS — F32A Depression, unspecified: Secondary | ICD-10-CM

## 2023-08-29 NOTE — Telephone Encounter (Signed)
Patient just had surgery last month, a total hysterectomy. She is out of the following medications;   amLODipine (NORVASC) 5 MG tablet, olmesartan-hydrochlorothiazide (BENICAR HCT) 20-12.5 MG tablet, and  ondansetron (ZOFRAN) 4 MG tablet , LORazepam (ATIVAN) 0.5 MG tablet.   She is going back to work on 09/19/2023, no CPE appointments available until after that date.  Patient doesn't feel she needs to come into office because she was just in office in 06/02/2023.

## 2023-08-30 MED ORDER — ONDANSETRON HCL 4 MG PO TABS
4.0000 mg | ORAL_TABLET | Freq: Three times a day (TID) | ORAL | 5 refills | Status: DC | PRN
Start: 2023-08-30 — End: 2024-04-23

## 2023-08-30 MED ORDER — AMLODIPINE BESYLATE 5 MG PO TABS: 5.0000 mg | ORAL_TABLET | ORAL | 3 refills | Status: DC

## 2023-08-30 MED ORDER — OLMESARTAN MEDOXOMIL-HCTZ 20-12.5 MG PO TABS: 1.0000 | ORAL_TABLET | ORAL | 3 refills | Status: DC

## 2023-09-14 ENCOUNTER — Telehealth: Payer: Self-pay

## 2023-09-14 ENCOUNTER — Encounter: Payer: Self-pay | Admitting: Psychiatry

## 2023-09-14 ENCOUNTER — Ambulatory Visit (INDEPENDENT_AMBULATORY_CARE_PROVIDER_SITE_OTHER): Payer: 59 | Admitting: Psychiatry

## 2023-09-14 ENCOUNTER — Other Ambulatory Visit: Payer: Self-pay | Admitting: Nurse Practitioner

## 2023-09-14 VITALS — BP 139/97 | HR 130 | Temp 95.7°F | Ht 63.0 in | Wt 203.4 lb

## 2023-09-14 DIAGNOSIS — G4700 Insomnia, unspecified: Secondary | ICD-10-CM | POA: Insufficient documentation

## 2023-09-14 DIAGNOSIS — F419 Anxiety disorder, unspecified: Secondary | ICD-10-CM | POA: Insufficient documentation

## 2023-09-14 DIAGNOSIS — Z789 Other specified health status: Secondary | ICD-10-CM | POA: Diagnosis not present

## 2023-09-14 DIAGNOSIS — F411 Generalized anxiety disorder: Secondary | ICD-10-CM

## 2023-09-14 DIAGNOSIS — R000 Tachycardia, unspecified: Secondary | ICD-10-CM

## 2023-09-14 DIAGNOSIS — F32A Depression, unspecified: Secondary | ICD-10-CM | POA: Diagnosis not present

## 2023-09-14 MED ORDER — LAMOTRIGINE 25 MG PO TABS
25.0000 mg | ORAL_TABLET | Freq: Every day | ORAL | 1 refills | Status: DC
Start: 2023-09-14 — End: 2023-11-21

## 2023-09-14 MED ORDER — VENLAFAXINE HCL 100 MG PO TABS: 50.0000 mg | ORAL_TABLET | Freq: Two times a day (BID) | ORAL | 1 refills | Status: DC

## 2023-09-14 MED ORDER — HYDROXYZINE HCL 25 MG PO TABS: 25.0000 mg | ORAL_TABLET | Freq: Three times a day (TID) | ORAL | 1 refills | Status: AC | PRN

## 2023-09-14 MED ORDER — LORAZEPAM 0.5 MG PO TABS
0.5000 mg | ORAL_TABLET | Freq: Every day | ORAL | 2 refills | Status: DC | PRN
Start: 2023-09-14 — End: 2023-12-21

## 2023-09-14 NOTE — Telephone Encounter (Signed)
Prescription Request  09/14/2023  LOV: Visit date not found  What is the name of the medication or equipment? LORazepam (ATIVAN) 0.5 MG tablet  Have you contacted your pharmacy to request a refill? No   Which pharmacy would you like this sent to?  [facility] [address]), [place] - [address] [place]) [place] [address] Phone: [phone] Fax: [phone]    Patient notified that their request is being sent to the clinical staff for review and that they should receive a response within 2 business days.   Please advise at Mobile [phone] (mobile)  Patient states she is out of this medication.  Patient states she had a real bad panic attack last night and took her last one.  Patient states she is ok now, but she does need to have this refilled.

## 2023-09-14 NOTE — Patient Instructions (Addendum)
Valerie Morales- [id number] - call for therapy   Stop Lorazepam and Effexor XR caps. Please see medication list for medication changes .   Lamotrigine Tablets What is this medication? LAMOTRIGINE (la MOE Patrecia Pace) prevents and controls seizures in people with epilepsy. It may also be used to treat bipolar disorder. It works by calming overactive nerves in your body. This medicine may be used for other purposes; ask your health care provider or pharmacist if you have questions. COMMON BRAND NAME(S): Lamictal, Subvenite What should I tell my care team before I take this medication? They need to know if you have any of these conditions: Heart disease History of irregular heartbeat Immune system problems Kidney disease Liver disease Low levels of folic acid in the blood Lupus Mental health condition Suicidal thoughts, plans, or attempt by you or a family member An unusual or allergic reaction to lamotrigine, other medications, foods, dyes, or preservatives Pregnant or trying to get pregnant Breastfeeding How should I use this medication? Take this medication by mouth with a glass of water. Follow the directions on the prescription label. Do not chew these tablets. If this medication upsets your stomach, take it with food or milk. Take your doses at regular intervals. Do not take your medication more often than directed. A special MedGuide will be given to you by the pharmacist with each new prescription and refill. Be sure to read this information carefully each time. Talk to your care team about the use of this medication in children. While this medication may be prescribed for children as young as 2 years for selected conditions, precautions do apply. Overdosage: If you think you have taken too much of this medicine contact a poison control center or emergency room at once. NOTE: This medicine is only for you. Do not share this medicine with others. What if I miss a dose? If you  miss a dose, take it as soon as you can. If it is almost time for your next dose, take only that dose. Do not take double or extra doses. What may interact with this medication? Atazanavir Certain medications for irregular heartbeat Certain medications for seizures, such as carbamazepine, phenobarbital, phenytoin, primidone, or valproic acid Estrogen or progestin hormones Lopinavir Rifampin Ritonavir This list may not describe all possible interactions. Give your health care provider a list of all the medicines, herbs, non-prescription drugs, or dietary supplements you use. Also tell them if you smoke, drink alcohol, or use illegal drugs. Some items may interact with your medicine. What should I watch for while using this medication? Visit your care team for regular checks on your progress. If you take this medication for seizures, wear a Medic Alert bracelet or necklace. Carry an identification card with information about your condition, medications, and care team. It is important to take this medication exactly as directed. When first starting treatment, your dose will need to be adjusted slowly. It may take weeks or months before your dose is stable. You should contact your care team if your seizures get worse or if you have any new types of seizures. Do not stop taking this medication unless instructed by your care team. Stopping your medication suddenly can increase your seizures or their severity. This medication may cause serious skin reactions. They can happen weeks to months after starting the medication. Contact your care team right away if you notice fevers or flu-like symptoms with a rash. The rash may be red or purple and then turn into blisters or  peeling of the skin. You may also notice a red rash with swelling of the face, lips, or lymph nodes in your neck or under your arms. This medication may affect your coordination, reaction time, or judgment. Do not drive or operate machinery  until you know how this medication affects you. Sit up or stand slowly to reduce the risk of dizzy or fainting spells. Drinking alcohol with this medication can increase the risk of these side effects. If you are taking this medication for bipolar disorder, it is important to report any changes in your mood to your care team. If your condition gets worse, you get mentally depressed, feel very hyperactive or manic, have difficulty sleeping, or have thoughts of hurting yourself or committing suicide, you need to get help from your care team right away. If you are a caregiver for someone taking this medication for bipolar disorder, you should also report these behavioral changes right away. The use of this medication may increase the chance of suicidal thoughts or actions. Pay special attention to how you are responding while on this medication. Your mouth may get dry. Chewing sugarless gum or sucking hard candy and drinking plenty of water may help. Contact your care team if the problem does not go away or is severe. If you become pregnant while using this medication, you may enroll in the [place] [employer]iepileptic Drug Pregnancy Registry by calling [id number]. This registry collects information about the safety of antiepileptic medication use during pregnancy. This medication may cause a decrease in folic acid. You should make sure that you get enough folic acid while you are taking this medication. Discuss the foods you eat and the vitamins you take with your care team. What side effects may I notice from receiving this medication? Side effects that you should report to your care team as soon as possible: Allergic reactions--skin rash, itching, hives, swelling of the face, lips, tongue, or throat Change in vision Fever, neck pain or stiffness, sensitivity to light, headache, nausea, vomiting, confusion Heart rhythm changes--fast or irregular heartbeat, dizziness, feeling faint or lightheaded, chest  pain, trouble breathing Infection--fever, chills, cough, or sore throat Liver injury--right upper belly pain, loss of appetite, nausea, light-colored stool, dark yellow or brown urine, yellowing skin or eyes, unusual weakness or fatigue Low red blood cell count--unusual weakness or fatigue, dizziness, headache, trouble breathing Rash, fever, and swollen lymph nodes Redness, blistering, peeling or loosening of the skin, including inside the mouth Thoughts of suicide or self-harm, worsening mood, or feelings of depression Unusual bruising or bleeding Side effects that usually do not require medical attention (report to your care team if they continue or are bothersome): Diarrhea Dizziness Drowsiness Headache Nausea Stomach pain Tremors or shaking This list may not describe all possible side effects. Call your doctor for medical advice about side effects. You may report side effects to FDA at 1-800-FDA-1088. Where should I keep my medication? Keep out of the reach of children and pets. Store at [employer] [address] (77 degrees F). Protect from light. Get rid of any unused medication after the expiration date. To get rid of medications that are no longer needed or have expired: Take the medication to a medication take-back program. Check with your pharmacy or law enforcement to find a location. If you cannot return the medication, check the label or package insert to see if the medication should be thrown out in the garbage or flushed down the toilet. If you are not sure, ask your care team.  If it is safe to put it in the trash, empty the medication out of the container. Mix the medication with cat litter, dirt, coffee grounds, or other unwanted substance. Seal the mixture in a bag or container. Put it in the trash. NOTE: This sheet is a summary. It may not cover all possible information. If you have questions about this medicine, talk to your doctor, pharmacist, or health care provider.  2024  [employer]vier/Gold Standard (2022-04-13 00:00:00)   Hydroxyzine Capsules or Tablets What is this medication? HYDROXYZINE (hye DROX i zeen) treats the symptoms of allergies and allergic reactions. It may also be used to treat anxiety or cause drowsiness before a procedure. It works by blocking histamine, a substance released by the body during an allergic reaction. It belongs to a group of medications called antihistamines. This medicine may be used for other purposes; ask your health care provider or pharmacist if you have questions. COMMON BRAND NAME(S): ANX, Atarax, Rezine, Vistaril What should I tell my care team before I take this medication? They need to know if you have any of these conditions: Glaucoma Heart disease Irregular heartbeat or rhythm Kidney disease Liver disease Lung or breathing disease, such as asthma Stomach or intestine problems Thyroid disease Trouble passing urine An unusual or allergic reaction to hydroxyzine, other medications, foods, dyes or preservatives Pregnant or trying to get pregnant Breastfeeding How should I use this medication? Take this medication by mouth with a full glass of water. Take it as directed on the prescription label at the same time every day. You can take it with or without food. If it upsets your stomach, take it with food. Talk to your care team about the use of this medication in children. While it may be prescribed for children as young as 6 years for selected conditions, precautions do apply. People 65 years and older may have a stronger reaction and need a smaller dose. Overdosage: If you think you have taken too much of this medicine contact a poison control center or emergency room at once. NOTE: This medicine is only for you. Do not share this medicine with others. What if I miss a dose? If you miss a dose, take it as soon as you can. If it is almost time for your next dose, take only that dose. Do not take double or extra  doses. What may interact with this medication? Do not take this medication with any of the following: Cisapride Dronedarone Pimozide Thioridazine This medication may also interact with the following: Alcohol Antihistamines for allergy, cough, and cold Atropine Barbiturate medications for sleep or seizures, such as phenobarbital Certain antibiotics, such as erythromycin or clarithromycin Certain medications for anxiety or sleep Certain medications for bladder problems, such as oxybutynin or tolterodine Certain medications for irregular heartbeat Certain medications for mental health conditions Certain medications for Parkinson disease, such as benztropine, trihexyphenidyl Certain medications for seizures, such as phenobarbital or primidone Certain medications for stomach problems, such as dicyclomine or hyoscyamine Certain medications for travel sickness, such as scopolamine Ipratropium Opioid medications for pain Other medications that cause heart rhythm changes, such as dofetilide This list may not describe all possible interactions. Give your health care provider a list of all the medicines, herbs, non-prescription drugs, or dietary supplements you use. Also tell them if you smoke, drink alcohol, or use illegal drugs. Some items may interact with your medicine. What should I watch for while using this medication? Visit your care team for regular checks on your progress.  Tell your care team if your symptoms do not start to get better or if they get worse. This medication may affect your coordination, reaction time, or judgment. Do not drive or operate machinery until you know how this medication affects you. Sit up or stand slowly to reduce the risk of dizzy or fainting spells. Drinking alcohol with this medication can increase the risk of these side effects. Your mouth may get dry. Chewing sugarless gum or sucking hard candy and drinking plenty of water may help. Contact your care team  if the problem does not go away or is severe. This medication may cause dry eyes and blurred vision. If you wear contact lenses, you may feel some discomfort. Lubricating eye drops may help. See your care team if the problem does not go away or is severe. If you are receiving skin tests for allergies, tell your care team you are taking this medication. What side effects may I notice from receiving this medication? Side effects that you should report to your care team as soon as possible: Allergic reactions--skin rash, itching, hives, swelling of the face, lips, tongue, or throat Heart rhythm changes--fast or irregular heartbeat, dizziness, feeling faint or lightheaded, chest pain, trouble breathing Side effects that usually do not require medical attention (report to your care team if they continue or are bothersome): Confusion Drowsiness Dry mouth Hallucinations Headache This list may not describe all possible side effects. Call your doctor for medical advice about side effects. You may report side effects to FDA at 1-800-FDA-1088. Where should I keep my medication? Keep out of the reach of children and pets. Store at room temperature between 15 and 30 degrees C (59 and 86 degrees F). Keep container tightly closed. Throw away any unused medication after the expiration date. NOTE: This sheet is a summary. It may not cover all possible information. If you have questions about this medicine, talk to your doctor, pharmacist, or health care provider.  2024 [employer]/Gold Standard (2022-05-14 00:00:00)
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
## 2023-10-03 ENCOUNTER — Encounter: Payer: Self-pay | Admitting: Nurse Practitioner

## 2023-10-03 NOTE — Telephone Encounter (Signed)
Called patient as instructed by provider to discuss FMLA/ADA paperwork made patient aware that she would have to be an established patient her for 6 months or more she voiced understanding

## 2023-10-11 ENCOUNTER — Encounter: Payer: Self-pay | Admitting: Nurse Practitioner

## 2023-10-11 ENCOUNTER — Ambulatory Visit (INDEPENDENT_AMBULATORY_CARE_PROVIDER_SITE_OTHER): Payer: 59 | Admitting: Nurse Practitioner

## 2023-10-11 VITALS — BP 118/82 | HR 86 | Temp 97.8°F | Ht 63.0 in | Wt 209.6 lb

## 2023-10-11 DIAGNOSIS — F411 Generalized anxiety disorder: Secondary | ICD-10-CM | POA: Diagnosis not present

## 2023-10-11 DIAGNOSIS — F331 Major depressive disorder, recurrent, moderate: Secondary | ICD-10-CM

## 2023-10-11 NOTE — Assessment & Plan Note (Addendum)
She reports feeling depressed, especially during this time of year, which may be linked to recent changes in her work environment, and a lack of social interaction. PHQ- 17 today. We will continue her current medications: Lamictal and Effexor. She is encouraged to decrease alcohol intake and to follow up with psychiatry as scheduled. Therapy sessions will continue. Encouraged to contact if worsening symptoms, unusual behavior changes or suicidal thoughts occur.

## 2023-10-11 NOTE — Assessment & Plan Note (Addendum)
She experiences severe anxiety attacks every other day, particularly in crowded places and at work, which may have been exacerbated by a recent total hysterectomy. GAD- 19 today. She is under psychiatric care and has had a recent medication adjustment. We will continue her current medications: Lamictal, Effexor, and Hydroxyzine. Ativan will be used as needed for severe anxiety attacks. She is encouraged to decrease alcohol intake and to follow up with psychiatry and OB/GYN as scheduled. Therapy sessions will continue. She has requested to work from home due to severe anxiety in the workplace. Necessary paperwork for work accommodations will be completed and submitted. A follow-up in 2-3 months is scheduled to assess the progress and effectiveness of the current treatment plan.

## 2023-10-11 NOTE — Progress Notes (Signed)
Valerie Dicker, NP-C Phone: [phone]  Valerie Morales is a 47 y.o. female who presents today for anxiety and depression.   Discussed the use of AI scribe software for clinical note transcription with the patient, who gave verbal consent to proceed.  History of Present Illness   The patient, with a history of recent hysterectomy and suspected bipolar disorder, presents with escalating anxiety and panic attacks. These attacks are reported to occur every other day, particularly in work and crowded environments. The onset of these symptoms is noted to be post-hysterectomy, suggesting a possible link to hormonal changes. The patient has been under the care of a psychiatrist, who has initiated Lamictal and adjusted Effexor dosage. However, the patient reports no discernible improvement yet.  The patient was also switched from Ativan to Hydroxyzine due to concerns about potential alcohol interaction. However, the patient reports that Ativan was more effective and was only used as needed. The patient admits to consuming three to four glasses of wine daily.  Despite these challenges, the patient denies any thoughts of self-harm.  The patient's anxiety has significantly impacted her daily life, inhibiting activities such as driving and shopping in large stores. The patient has been working from home, which she finds more comfortable, but also reports feelings of isolation and overstimulation when venturing out.  The patient's anxiety appears to be exacerbated during this time of year, which she attributes to seasonal depression. The patient is also seeing a therapist and is committed to continuing psychiatric follow-ups and medication regimen. The patient has a supportive partner who has observed a significant change in the patient's behavior and mood since the hysterectomy.      Social History   Tobacco Use  Smoking Status Never  Smokeless Tobacco Never    Current Outpatient Medications on  File Prior to Visit  Medication Sig Dispense Refill   amLODipine (NORVASC) 5 MG tablet Take 1 tablet (5 mg total) by mouth every morning. 90 tablet 3   cetirizine (ZYRTEC) 10 MG tablet Take 10 mg by mouth as needed.     diphenhydrAMINE (BENADRYL) 25 MG tablet Take 25 mg by mouth daily as needed for allergies.      docusate sodium (COLACE) 100 MG capsule Take 1 capsule (100 mg total) by mouth 2 (two) times daily. To keep stools soft (Patient not taking: Reported on 09/14/2023) 30 capsule 0   EPINEPHrine 0.3 mg/0.3 mL IJ SOAJ injection Inject 0.3 mg into the muscle as needed for anaphylaxis. 1 each 1   estradiol (VIVELLE-DOT) 0.1 MG/24HR patch Place onto the skin 2 (two) times a week.     hydrOXYzine (ATARAX) 25 MG tablet Take 1 tablet (25 mg total) by mouth 3 (three) times daily as needed for anxiety. For severe anxiety attacks 90 tablet 1   ibuprofen (ADVIL) 800 MG tablet Take 1 tablet (800 mg total) by mouth every 8 (eight) hours as needed. 30 tablet 0   lamoTRIgine (LAMICTAL) 25 MG tablet Take 1 tablet (25 mg total) by mouth daily. 30 tablet 1   LORazepam (ATIVAN) 0.5 MG tablet Take 1 tablet (0.5 mg total) by mouth daily as needed for anxiety. (Patient not taking: Reported on 10/11/2023) 30 tablet 2   olmesartan-hydrochlorothiazide (BENICAR HCT) 20-12.5 MG tablet Take 1 tablet by mouth every morning. 90 tablet 3   ondansetron (ZOFRAN) 4 MG tablet Take 1 tablet (4 mg total) by mouth every 8 (eight) hours as needed for nausea or vomiting. 30 tablet 5   traZODone (DESYREL) 50  MG tablet Take 1 tablet by mouth at bedtime.     venlafaxine (EFFEXOR) 100 MG tablet Take 0.5 tablets (50 mg total) by mouth 2 (two) times daily. 30 tablet 1   No current facility-administered medications on file prior to visit.     ROS see history of present illness  Objective  Physical Exam Vitals:   10/11/23 0913  BP: 118/82  Pulse: 86  Temp: 97.8 F (36.6 C)  SpO2: 99%    BP Readings from Last 3 Encounters:   10/11/23 118/82  09/14/23 (!) 139/97  07/28/23 (!) 134/95   Wt Readings from Last 3 Encounters:  10/11/23 209 lb 9.6 oz (95.1 kg)  09/14/23 203 lb 6.4 oz (92.3 kg)  07/20/23 210 lb 1.6 oz (95.3 kg)    Physical Exam Constitutional:      General: She is not in acute distress.    Appearance: Normal appearance.  HENT:     Head: Normocephalic.  Cardiovascular:     Rate and Rhythm: Normal rate and regular rhythm.     Heart sounds: Normal heart sounds.  Pulmonary:     Effort: Pulmonary effort is normal.     Breath sounds: Normal breath sounds.  Skin:    General: Skin is warm and dry.  Neurological:     General: No focal deficit present.     Mental Status: She is alert.  Psychiatric:        Mood and Affect: Mood is anxious. Affect is tearful.        Behavior: Behavior normal.    Assessment/Plan: Please see individual problem list.  Generalized anxiety disorder Assessment & Plan: She experiences severe anxiety attacks every other day, particularly in crowded places and at work, which may have been exacerbated by a recent total hysterectomy. GAD- 19 today. She is under psychiatric care and has had a recent medication adjustment. We will continue her current medications: Lamictal, Effexor, and Hydroxyzine. Ativan will be used as needed for severe anxiety attacks. She is encouraged to decrease alcohol intake and to follow up with psychiatry and OB/GYN as scheduled. Therapy sessions will continue. She has requested to work from home due to severe anxiety in the workplace. Necessary paperwork for work accommodations will be completed and submitted. A follow-up in 2-3 months is scheduled to assess the progress and effectiveness of the current treatment plan.   Moderate episode of recurrent major depressive disorder [facility]) Assessment & Plan: She reports feeling depressed, especially during this time of year, which may be linked to recent changes in her work environment, and a lack of social  interaction. PHQ- 17 today. We will continue her current medications: Lamictal and Effexor. She is encouraged to decrease alcohol intake and to follow up with psychiatry as scheduled. Therapy sessions will continue. Encouraged to contact if worsening symptoms, unusual behavior changes or suicidal thoughts occur.     Return in about 3 months (around 01/09/2024) for Anxiety/Depression.   Valerie Dicker, NP-C [facility]

## 2023-10-17 ENCOUNTER — Telehealth: Payer: Self-pay

## 2023-10-17 ENCOUNTER — Telehealth: Payer: Self-pay | Admitting: Nurse Practitioner

## 2023-10-17 NOTE — Telephone Encounter (Signed)
Called and informed pt that disability forms have been completed and signed by pcp and that forms have been faxed tot he given fax number [phone] with a completed transmission log and that her copy is ready for pick up   Forms placed in designated pick up area

## 2023-11-10 ENCOUNTER — Ambulatory Visit: Payer: 59 | Admitting: Psychiatry

## 2023-11-19 ENCOUNTER — Other Ambulatory Visit: Payer: Self-pay | Admitting: Psychiatry

## 2023-11-19 DIAGNOSIS — F32A Depression, unspecified: Secondary | ICD-10-CM

## 2023-12-17 ENCOUNTER — Other Ambulatory Visit: Payer: Self-pay | Admitting: Psychiatry

## 2023-12-17 DIAGNOSIS — F419 Anxiety disorder, unspecified: Secondary | ICD-10-CM

## 2023-12-17 DIAGNOSIS — F32A Depression, unspecified: Secondary | ICD-10-CM

## 2023-12-18 NOTE — Progress Notes (Unsigned)
 [facility] MD/PA/NP OP Progress Note  12/18/2023 9:02 PM Valerie Morales  MRN:  [id number]  Chief Complaint: No chief complaint on file.  HPI: Valerie Morales is a 48 year old African-American female, employed, currently lives in [place], married, has a history of depressive disorder, anxiety disorder, significant use of alcohol, essential hypertension, hemorrhagic cyst in the left ovary status post hysterectomy, history of gastric bypass, history of obstructive sleep apnea likely mild, noncompliant on CPAP was evaluated in office today for a follow-up appointment.    Visit Diagnosis: No diagnosis found.  Past Psychiatric History: I have reviewed past psychiatric history from progress note on 09/14/2023.  Past trials of medications-lorazepam, venlafaxine.  Past Medical History:  Past Medical History:  Diagnosis Date   Anemia    has required 2 units PRBC   Anxiety    Arthritis    B12 deficiency    Back pain    Bipolar depression (HCC)    Chest pain    Chicken pox    Depression    trazadone has not helped in the past    Fatty liver    H/O alcohol abuse    H/O gastric bypass    Headache    Hepatomegaly    High cholesterol    History of blood transfusion 2009   [place]   Hypertension    Morbid obesity (HCC)    MRSA (methicillin resistant Staphylococcus aureus)    Ovarian cyst    Pelvic pain    Scoliosis of thoracic spine    Shortness of breath on exertion    Sinus tachycardia    Sinusitis    Sinusitis, chronic    Sleep apnea    pt states mild case and did not get cpap   UTI (urinary tract infection)    Vitamin D deficiency     Past Surgical History:  Procedure Laterality Date   ANKLE ARTHROSCOPY Left 05/09/2020   Procedure: ANKLE ARTHROSCOPY OCD REPAIR LEFT;  Surgeon: Gwyneth Revels, DPM;  Location: [facility] ORS;  Service: Podiatry;  Laterality: Left;   CESAREAN SECTION     x3 1995, 1996, 1998   CHOLECYSTECTOMY  1998   COLONOSCOPY WITH PROPOFOL N/A 05/01/2019    Procedure: COLONOSCOPY WITH PROPOFOL;  Surgeon: Midge Minium, MD;  Location: [facility];  Service: Endoscopy;  Laterality: N/A;   CYSTOSCOPY N/A 07/28/2023   Procedure: CYSTOSCOPY;  Surgeon: Christeen Douglas, MD;  Location: [facility] ORS;  Service: Gynecology;  Laterality: N/A;   ESOPHAGOGASTRODUODENOSCOPY (EGD) WITH PROPOFOL N/A 05/01/2019   Procedure: ESOPHAGOGASTRODUODENOSCOPY (EGD) WITH PROPOFOL;  Surgeon: Midge Minium, MD;  Location: [facility] ENDOSCOPY;  Service: Endoscopy;  Laterality: N/A;   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 in [facility]    GRAFT APPLICATION Left 12/04/2021   Procedure: GRAFT APPLICATION - CALCANEAL AUTOGRAFT;  Surgeon: Gwyneth Revels, DPM;  Location: [facility] ORS;  Service: Podiatry;  Laterality: Left;   NASAL SINUS SURGERY     2009/2010   ORIF CALCANEOUS FRACTURE Left 12/04/2021   Procedure: REPAIR NONUNION - CALCANEAL EVANS OSTEOTOMY SITE;  Surgeon: Gwyneth Revels, DPM;  Location: [facility] ORS;  Service: Podiatry;  Laterality: Left;   OSTECTOMY Left 05/09/2020   Procedure: EVANS/MCDO LEFT;  Surgeon: Gwyneth Revels, DPM;  Location: [facility] ORS;  Service: Podiatry;  Laterality: Left;   ROBOTIC ASSISTED TOTAL HYSTERECTOMY WITH BILATERAL SALPINGO OOPHERECTOMY N/A 07/28/2023   Procedure: XI ROBOTIC ASSISTED TOTAL HYSTERECTOMY WITH BILATERAL SALPINGO OOPHORECTOMY;  Surgeon: Christeen Douglas, MD;  Location: [facility] ORS;  Service: Gynecology;  Laterality: N/A;   TENDON TRANSFER Left 05/09/2020   Procedure: FDL TRANSFER;DEEP LEFT;  Surgeon: Gwyneth Revels, DPM;  Location: [facility] ORS;  Service: Podiatry;  Laterality: Left;   TUBAL LIGATION     uterine ablation  2010   WOUND DEBRIDEMENT Left 05/09/2020   Procedure: A-SCOPE/DEBRIDEMENT/EXTENSIVE LEFT;  Surgeon: Gwyneth Revels, DPM;  Location: [facility] ORS;  Service: Podiatry;  Laterality: Left;    Family Psychiatric History: I have reviewed family psychiatric history from my progress note on 09/14/2023.  Family History:  Family History   Problem Relation Age of Onset   Bipolar disorder Mother    Obesity Mother    Sleep apnea Mother    Thyroid disease Mother    Hyperlipidemia Mother    Cancer Mother 48       lung cancer-small cell lung cancer    Heart disease Mother        MI with stents   Hypertension Mother    Diabetes Mother    COPD Mother    Hypertension Father    Hyperlipidemia Father    Cancer Maternal Aunt        breast cancer   Breast cancer Maternal Aunt    Alcohol abuse Maternal Aunt        Liver Failure   Cancer Maternal Uncle        colon cancer   Alcohol abuse Maternal Grandfather    Heart disease Maternal Grandfather    Cancer Maternal Grandmother        brain cancer    Heart disease Paternal Grandfather    Hypertension Paternal Grandfather    Arthritis Paternal Grandmother    Cancer Paternal Grandmother        leukemia    Hypertension Paternal Grandmother    Breast cancer Paternal Grandmother    Autism spectrum disorder Daughter    Mental illness Son     Social History: I have reviewed social history from progress note on 09/14/2023. Social History   Socioeconomic History   Marital status: Married    Spouse name: Maurine Minister   Number of children: 3   Years of education: 16   Highest education level: Not on file  Occupational History   Occupation: caseworker  Tobacco Use   Smoking status: Never   Smokeless tobacco: Never  Vaping Use   Vaping status: Never Used  Substance and Sexual Activity   Alcohol use: Yes    Alcohol/week: 4.0 standard drinks of alcohol    Types: 3 Glasses of wine, 1 Cans of beer per week    Comment: daily   Drug use: Not Currently    Types: Marijuana    Comment: 17 YEARS AGO   Sexual activity: Yes    Birth control/protection: None, Surgical    Comment: tubal ligation  Other Topics Concern   Not on file  Social History Narrative   Valerie "[name]" grew up in [place], [place]. She attended [employer] and obtained her Bachelors in [employer]. She lives  in [place] with her 3 children and her husband       Caffeine - 2 coffee, no sodas   Exercise - not currently   Bachelors degree    Caseworker    4 pregnancies, 3 live births    Feels safe in relationship    Wears seatbelt   No guns       Social Drivers of Corporate investment banker Strain: Not on file  Food Insecurity: Not on file  Transportation Needs: Not on file  Physical Activity: Not on file  Stress: Not on file  Social Connections: Not on file    Allergies:  Allergies  Allergen Reactions   Other Hives     Nut allergy . Walnuts and hazelnuts are the worst.    Penicillins Itching    Hives    Percocet [Oxycodone-Acetaminophen] Hives    Hives    Ambien [Zolpidem]     Hallucinations falls   Lipitor [Atorvastatin]     Unable to tolerate h/o fatty liver with elevated lfts    Molds & Smuts Other (See Comments)    Congestion and sneezing    Aspirin Itching    hives   Latex Rash    Hives .     Metabolic Disorder Labs: Lab Results  Component Value Date   HGBA1C 5.5 03/05/2021   MPG 100 11/11/2008   No results found for: "PROLACTIN" Lab Results  Component Value Date   CHOL 202 (H) 10/13/2021   TRIG 192.0 (H) 10/13/2021   HDL 94.40 10/13/2021   CHOLHDL 2 10/13/2021   VLDL 38.4 10/13/2021   LDLCALC 69 10/13/2021   LDLCALC 97 03/05/2021   Lab Results  Component Value Date   TSH 2.220 05/05/2021   TSH 2.74 07/23/2020    Therapeutic Level Labs: No results found for: "LITHIUM" No results found for: "VALPROATE" No results found for: "CBMZ"  Current Medications: Current Outpatient Medications  Medication Sig Dispense Refill   amLODipine (NORVASC) 5 MG tablet Take 1 tablet (5 mg total) by mouth every morning. 90 tablet 3   cetirizine (ZYRTEC) 10 MG tablet Take 10 mg by mouth as needed.     diphenhydrAMINE (BENADRYL) 25 MG tablet Take 25 mg by mouth daily as needed for allergies.      docusate sodium (COLACE) 100 MG capsule Take 1 capsule (100 mg  total) by mouth 2 (two) times daily. To keep stools soft (Patient not taking: Reported on 09/14/2023) 30 capsule 0   EPINEPHrine 0.3 mg/0.3 mL IJ SOAJ injection Inject 0.3 mg into the muscle as needed for anaphylaxis. 1 each 1   estradiol (VIVELLE-DOT) 0.1 MG/24HR patch Place onto the skin 2 (two) times a week.     hydrOXYzine (ATARAX) 25 MG tablet Take 1 tablet (25 mg total) by mouth 3 (three) times daily as needed for anxiety. For severe anxiety attacks 90 tablet 1   ibuprofen (ADVIL) 800 MG tablet Take 1 tablet (800 mg total) by mouth every 8 (eight) hours as needed. 30 tablet 0   lamoTRIgine (LAMICTAL) 25 MG tablet Take 1 tablet by mouth once daily 30 tablet 2   LORazepam (ATIVAN) 0.5 MG tablet Take 1 tablet (0.5 mg total) by mouth daily as needed for anxiety. (Patient not taking: Reported on 10/11/2023) 30 tablet 2   olmesartan-hydrochlorothiazide (BENICAR HCT) 20-12.5 MG tablet Take 1 tablet by mouth every morning. 90 tablet 3   ondansetron (ZOFRAN) 4 MG tablet Take 1 tablet (4 mg total) by mouth every 8 (eight) hours as needed for nausea or vomiting. 30 tablet 5   traZODone (DESYREL) 50 MG tablet Take 1 tablet by mouth at bedtime.     venlafaxine (EFFEXOR) 100 MG tablet TAKE ONE-HALF TABLET BY MOUTH TWICE DAILY. STOP EFFEXOR XR 150MG  AND 37.5MG . 30 tablet 0   No current facility-administered medications for this visit.     Musculoskeletal: Strength & Muscle Tone: within normal limits Gait & Station: normal Patient leans: N/A  Psychiatric Specialty Exam: Review of Systems  There were no vitals  taken for this visit.There is no height or weight on file to calculate BMI.  General Appearance: Casual  Eye Contact:  Fair  Speech:  Normal Rate  Volume:  Normal  Mood:  Anxious and Depressed  Affect:  Congruent  Thought Process:  Goal Directed and Descriptions of Associations: Intact  Orientation:  Full (Time, Place, and Person)  Thought Content: Logical   Suicidal Thoughts:  No   Homicidal Thoughts:  No  Memory:  Immediate;   Fair Recent;   Fair Remote;   Fair  Judgement:  Fair  Insight:  Fair  Psychomotor Activity:  Normal  Concentration:  Concentration: Fair and Attention Span: Fair  Recall:  [employer] of Knowledge: Fair  Language: Fair  Akathisia:  No  Handed:  Right  AIMS (if indicated): not done  Assets:  Desire for Improvement Housing Social Support  ADL's:  Intact  Cognition: WNL  Sleep:  Fair   Screenings: AUDIT    Loss adjuster, chartered Office Visit [facility]14/2023 [facility]  Alcohol Use Disorder Identification Test Final Score (AUDIT) 16      GAD-7    Flowsheet Row Office Visit from 10/11/2023 in [facility] [employer] Visit from 09/14/2023 in [facility] Office Visit from 06/02/2023 in [facility] [employer] Visit from 01/27/2023 in [facility] [employer] Visit from 05/28/2020 in [facility] [employer]  Total GAD-7 Score 19 17 3 4 12       PHQ2-9    Flowsheet Row Office Visit from 10/11/2023 in [facility] Visit from 09/14/2023 in [facility] Office Visit from 06/02/2023 in [facility] Visit from 01/27/2023 in [facility] [employer] Visit from 05/05/2021 in [facility] Total Score 4 5 3 2 1   PHQ-9 Total Score 17 17 12 9 6       Flowsheet Row Office Visit from 09/14/2023 in [facility] Admission (Discharged) from 07/28/2023 in [facility] ED from 06/22/2023 in [facility]  C-SSRS RISK  CATEGORY No Risk No Risk No Risk        Assessment and Plan: ***  Collaboration of Care: Collaboration of Care: [facility] OP Collaboration of Care:[id number]}  Patient/Guardian was advised Release of Information must be obtained prior to any record release in order to collaborate their care with an outside provider. Patient/Guardian was advised if they have not already done so to contact the registration department to sign all necessary forms in order for [place] to release information regarding their care.   Consent: Patient/Guardian gives verbal consent for treatment and assignment of benefits for services provided during this visit. Patient/Guardian expressed understanding and agreed to proceed.    Jomarie Longs, MD 12/18/2023, 9:02 PM

## 2023-12-21 ENCOUNTER — Ambulatory Visit: Payer: 59 | Admitting: Psychiatry

## 2023-12-21 ENCOUNTER — Encounter: Payer: Self-pay | Admitting: Psychiatry

## 2023-12-21 VITALS — BP 128/84 | HR 90 | Temp 97.3°F | Ht 63.0 in | Wt 213.4 lb

## 2023-12-21 DIAGNOSIS — F419 Anxiety disorder, unspecified: Secondary | ICD-10-CM | POA: Diagnosis not present

## 2023-12-21 DIAGNOSIS — Z789 Other specified health status: Secondary | ICD-10-CM

## 2023-12-21 DIAGNOSIS — G4700 Insomnia, unspecified: Secondary | ICD-10-CM

## 2023-12-21 DIAGNOSIS — Z79899 Other long term (current) drug therapy: Secondary | ICD-10-CM

## 2023-12-21 DIAGNOSIS — F1010 Alcohol abuse, uncomplicated: Secondary | ICD-10-CM | POA: Diagnosis not present

## 2023-12-21 DIAGNOSIS — F32A Depression, unspecified: Secondary | ICD-10-CM

## 2023-12-21 MED ORDER — TOPIRAMATE 25 MG PO TABS
25.0000 mg | ORAL_TABLET | Freq: Every day | ORAL | 1 refills | Status: DC
Start: 2023-12-21 — End: 2024-01-20

## 2023-12-21 NOTE — Patient Instructions (Signed)
Topiramate Tablets What is this medication? TOPIRAMATE (toe PYRE a mate) prevents and controls seizures in people with epilepsy. It may also be used to prevent migraine headaches. It works by calming overactive nerves in your body. This medicine may be used for other purposes; ask your health care provider or pharmacist if you have questions. COMMON BRAND NAME(S): Topamax, Topiragen What should I tell my care team before I take this medication? They need to know if you have any of these conditions: Bleeding disorder Kidney disease Lung disease Suicidal thoughts, plans, or attempt by you or a family member An unusual or allergic reaction to topiramate, other medications, foods, dyes, or preservatives Pregnant or trying to get pregnant Breast-feeding How should I use this medication? Take this medication by mouth with water. Take it as directed on the prescription label at the same time every day. Do not cut, crush or chew this medicine. Swallow the tablets whole. You can take it with or without food. If it upsets your stomach, take it with food. Keep taking it unless your care team tells you to stop. A special MedGuide will be given to you by the pharmacist with each prescription and refill. Be sure to read this information carefully each time. Talk to your care team about the use of this medication in children. While it may be prescribed for children as young as 2 years for selected conditions, precautions do apply. Overdosage: If you think you have taken too much of this medicine contact a poison control center or emergency room at once. NOTE: This medicine is only for you. Do not share this medicine with others. What if I miss a dose? If you miss a dose, take it as soon as you can unless it is within 6 hours of the next dose. If it is within 6 hours of the next dose, skip the missed dose. Take the next dose at the normal time. Do not take double or extra doses. What may interact with this  medication? Acetazolamide Alcohol Antihistamines for allergy, cough, and cold Aspirin and aspirin-like medications Atropine Certain medications for anxiety or sleep Certain medications for bladder problems, such as oxybutynin, tolterodine Certain medications for depression, such as amitriptyline, fluoxetine, sertraline Certain medications for Parkinson disease, such as benztropine, trihexyphenidyl Certain medications for seizures, such as carbamazepine, lamotrigine, phenobarbital, phenytoin, primidone, valproic acid, zonisamide Certain medications for stomach problems, such as dicyclomine, hyoscyamine Certain medications for travel sickness, such as scopolamine Certain medications that treat or prevent blood clots, such as warfarin, enoxaparin, dalteparin, apixaban, dabigatran, rivaroxaban Digoxin Diltiazem Estrogen and progestin hormones General anesthetics, such as halothane, isoflurane, methoxyflurane, propofol Glyburide Hydrochlorothiazide Ipratropium Lithium Medications that relax muscles Metformin NSAIDs, medications for pain and inflammation, such as ibuprofen or naproxen Opioid medications for pain Phenothiazines, such as chlorpromazine, mesoridazine, prochlorperazine, thioridazine Pioglitazone This list may not describe all possible interactions. Give your health care provider a list of all the medicines, herbs, non-prescription drugs, or dietary supplements you use. Also tell them if you smoke, drink alcohol, or use illegal drugs. Some items may interact with your medicine. What should I watch for while using this medication? Visit your care team for regular checks on your progress. Tell your care team if your symptoms do not start to get better or if they get worse. Do not suddenly stop taking this medication. You may develop a severe reaction. Your care team will tell you how much medication to take. If your care team wants you to stop the medication,  the dose may be slowly  lowered over time to avoid any side effects. Wear a medical ID bracelet or chain. Carry a card that describes your condition. List the medications and doses you take on the card. This medication may affect your coordination, reaction time, or judgment. Do not drive or operate machinery until you know how this medication affects you. Sit up or stand slowly to reduce the risk of dizzy or fainting spells. Drinking alcohol with this medication can increase the risk of these side effects. This medication may cause serious skin reactions. They can happen weeks to months after starting the medication. Contact your care team right away if you notice fevers or flu-like symptoms with a rash. The rash may be red or purple and then turn into blisters or peeling of the skin. You may also notice a red rash with swelling of the face, lips, or lymph nodes in your neck or under your arms. This medication may cause thoughts of suicide or depression. This includes sudden changes in mood, behaviors, or thoughts. These changes can happen at any time but are more common in the beginning of treatment or after a change in dose. Call your care team right away if you experience these thoughts or worsening depression. This medication may slow your child's growth if it is taken for a long time at high doses. Your child's care team will monitor your child's growth. Using this medication for a long time may weaken your bones. The risk of bone fractures may be increased. Talk to your care team about your bone health. Discuss this medication with your care team if you may be pregnant. Serious birth defects can occur if you take this medication during pregnancy. There are benefits and risks to taking medications during pregnancy. Your care team can help you find the option that works for you. Contraception is recommended while taking this medication. Estrogen and progestin hormones may not work as well while you are taking this medication.  Your care team can help you find the option that works for you. Talk to your care team before breastfeeding. Changes to your treatment plan may be needed. What side effects may I notice from receiving this medication? Side effects that you should report to your care team as soon as possible: Allergic reactions--skin rash, itching, hives, swelling of the face, lips, tongue, or throat High acid level--trouble breathing, unusual weakness or fatigue, confusion, headache, fast or irregular heartbeat, nausea, vomiting High ammonia level--unusual weakness or fatigue, confusion, loss of appetite, nausea, vomiting, seizures Fever that does not go away, decrease in sweat Kidney stones--blood in the urine, pain or trouble passing urine, pain in the lower back or sides Redness, blistering, peeling or loosening of the skin, including inside the mouth Sudden eye pain or change in vision such as blurry vision, seeing halos around lights, vision loss Thoughts of suicide or self-harm, worsening mood, feelings of depression Side effects that usually do not require medical attention (report to your care team if they continue or are bothersome): Burning or tingling sensation in hands or feet Difficulty with paying attention, memory, or speech Dizziness Drowsiness Fatigue Loss of appetite with weight loss Slow or sluggish movements of the body This list may not describe all possible side effects. Call your doctor for medical advice about side effects. You may report side effects to FDA at 1-800-FDA-1088. Where should I keep my medication? Keep out of the reach of children and pets. Store between 15 and 30 degrees C (  59 and 86 degrees F). Protect from moisture. Keep the container tightly closed. Get rid of any unused medication after the expiration date. To get rid of medications that are no longer needed or have expired: Take the medication to a medication take-back program. Check with your pharmacy or law  enforcement to find a location. If you cannot return the medication, check the label or package insert to see if the medication should be thrown out in the garbage or flushed down the toilet. If you are not sure, ask your care team. If it is safe to put it in the trash, empty the medication out of the container. Mix the medication with cat litter, dirt, coffee grounds, or other unwanted substance. Seal the mixture in a bag or container. Put it in the trash. NOTE: This sheet is a summary. It may not cover all possible information. If you have questions about this medicine, talk to your doctor, pharmacist, or health care provider.  2024 Elsevier/Gold Standard (2022-02-25 00:00:00)

## 2023-12-24 ENCOUNTER — Other Ambulatory Visit
Admission: RE | Admit: 2023-12-24 | Discharge: 2023-12-24 | Disposition: A | Discharge status: Home / Self Care | Attending: Psychiatry | Admitting: Psychiatry

## 2023-12-24 DIAGNOSIS — F1010 Alcohol abuse, uncomplicated: Secondary | ICD-10-CM

## 2023-12-24 DIAGNOSIS — Z79899 Other long term (current) drug therapy: Secondary | ICD-10-CM | POA: Diagnosis present

## 2023-12-24 DIAGNOSIS — F419 Anxiety disorder, unspecified: Secondary | ICD-10-CM | POA: Diagnosis present

## 2023-12-24 DIAGNOSIS — G4700 Insomnia, unspecified: Secondary | ICD-10-CM | POA: Diagnosis present

## 2023-12-24 LAB — HEPATIC FUNCTION PANEL
ALT: 52 U/L — ABNORMAL HIGH (ref 0–44)
AST: 145 U/L — ABNORMAL HIGH (ref 15–41)
Albumin: 4.3 g/dL (ref 3.5–5.0)
Alkaline Phosphatase: 129 U/L — ABNORMAL HIGH (ref 38–126)
Bilirubin, Direct: 0.1 mg/dL (ref 0.0–0.2)
Indirect Bilirubin: 0.6 mg/dL (ref 0.3–0.9)
Total Bilirubin: 0.7 mg/dL (ref 0.0–1.2)
Total Protein: 7.8 g/dL (ref 6.5–8.1)

## 2024-01-11 ENCOUNTER — Ambulatory Visit: Payer: 59 | Admitting: Nurse Practitioner

## 2024-01-11 ENCOUNTER — Encounter: Payer: Self-pay | Admitting: Nurse Practitioner

## 2024-01-11 VITALS — BP 118/82 | HR 95 | Temp 98.0°F | Ht 63.0 in | Wt 210.8 lb

## 2024-01-11 DIAGNOSIS — E785 Hyperlipidemia, unspecified: Secondary | ICD-10-CM | POA: Diagnosis not present

## 2024-01-11 DIAGNOSIS — R748 Abnormal levels of other serum enzymes: Secondary | ICD-10-CM

## 2024-01-11 DIAGNOSIS — E669 Obesity, unspecified: Secondary | ICD-10-CM | POA: Insufficient documentation

## 2024-01-11 DIAGNOSIS — F1010 Alcohol abuse, uncomplicated: Secondary | ICD-10-CM | POA: Diagnosis not present

## 2024-01-11 DIAGNOSIS — E538 Deficiency of other specified B group vitamins: Secondary | ICD-10-CM | POA: Diagnosis not present

## 2024-01-11 DIAGNOSIS — I10 Essential (primary) hypertension: Secondary | ICD-10-CM | POA: Diagnosis not present

## 2024-01-11 DIAGNOSIS — Z1329 Encounter for screening for other suspected endocrine disorder: Secondary | ICD-10-CM

## 2024-01-11 DIAGNOSIS — F331 Major depressive disorder, recurrent, moderate: Secondary | ICD-10-CM

## 2024-01-11 LAB — HEMOGLOBIN A1C: Hgb A1c MFr Bld: 5.3 % (ref 4.6–6.5)

## 2024-01-11 LAB — CBC WITH DIFFERENTIAL/PLATELET
Basophils Absolute: 0.1 10*3/uL (ref 0.0–0.1)
Basophils Relative: 0.8 % (ref 0.0–3.0)
Eosinophils Absolute: 0.2 10*3/uL (ref 0.0–0.7)
Eosinophils Relative: 2.4 % (ref 0.0–5.0)
HCT: 38.5 % (ref 36.0–46.0)
Hemoglobin: 13 g/dL (ref 12.0–15.0)
Lymphocytes Relative: 19.1 % (ref 12.0–46.0)
Lymphs Abs: 1.5 10*3/uL (ref 0.7–4.0)
MCHC: 33.8 g/dL (ref 30.0–36.0)
MCV: 103.3 fl — ABNORMAL HIGH (ref 78.0–100.0)
Monocytes Absolute: 0.5 10*3/uL (ref 0.1–1.0)
Monocytes Relative: 6 % (ref 3.0–12.0)
Neutro Abs: 5.7 10*3/uL (ref 1.4–7.7)
Neutrophils Relative %: 71.7 % (ref 43.0–77.0)
Platelets: 312 10*3/uL (ref 150.0–400.0)
RBC: 3.72 Mil/uL — ABNORMAL LOW (ref 3.87–5.11)
RDW: 14.5 % (ref 11.5–15.5)
WBC: 8 10*3/uL (ref 4.0–10.5)

## 2024-01-11 LAB — LIPID PANEL
Cholesterol: 225 mg/dL — ABNORMAL HIGH (ref 0–200)
HDL: 72.9 mg/dL (ref >39.00)
LDL Cholesterol: 129 mg/dL — ABNORMAL HIGH (ref 0–99)
NonHDL: 151.74
Total CHOL/HDL Ratio: 3
Triglycerides: 112 mg/dL (ref 0.0–149.0)
VLDL: 22.4 mg/dL (ref 0.0–40.0)

## 2024-01-11 LAB — HEPATIC FUNCTION PANEL
ALT: 26 U/L (ref 0–35)
AST: 47 U/L — ABNORMAL HIGH (ref 0–37)
Albumin: 4.4 g/dL (ref 3.5–5.2)
Alkaline Phosphatase: 128 U/L — ABNORMAL HIGH (ref 39–117)
Bilirubin, Direct: 0 mg/dL (ref 0.0–0.3)
Total Bilirubin: 0.3 mg/dL (ref 0.2–1.2)
Total Protein: 7.7 g/dL (ref 6.0–8.3)

## 2024-01-11 LAB — BASIC METABOLIC PANEL WITH GFR
BUN: 5 mg/dL — ABNORMAL LOW (ref 6–23)
CO2: 29 meq/L (ref 19–32)
Calcium: 9.5 mg/dL (ref 8.4–10.5)
Chloride: 97 meq/L (ref 96–112)
Creatinine, Ser: 0.65 mg/dL (ref 0.40–1.20)
GFR: 103.99 mL/min (ref >60.00)
Glucose, Bld: 95 mg/dL (ref 70–99)
Potassium: 4.2 meq/L (ref 3.5–5.1)
Sodium: 135 meq/L (ref 135–145)

## 2024-01-11 NOTE — Assessment & Plan Note (Signed)
 Blood pressure is well-controlled with current medications. Continue Norvasc 5 mg daily and Olmesartan-hydrochlorothiazide 20-12.5 mg daily. Lab work as outlined.

## 2024-01-11 NOTE — Assessment & Plan Note (Signed)
 Liver enzymes are elevated in recent lab work, likely due to fatty liver and alcohol consumption. Other causes are being evaluated. Repeat liver function tests and order a liver ultrasound. Consider a gastroenterology referral if the ultrasound shows progression.

## 2024-01-11 NOTE — Assessment & Plan Note (Signed)
 Diet controlled. Encourage healthy diet and regular exercise. We will check lipid panel today.

## 2024-01-11 NOTE — Assessment & Plan Note (Signed)
 Managed by Psychiatry. Currently taking Effexor 100 mg daily. She did not like how Topamax made her feel, she has since stopped taking. Continue Effexor. She is encouraged to decrease alcohol intake and to follow up with psychiatry as scheduled. Therapy sessions will continue. Encouraged to contact if worsening symptoms, unusual behavior changes or suicidal thoughts occur.

## 2024-01-11 NOTE — Assessment & Plan Note (Signed)
 Daily alcohol consumption, especially during stress, may contribute to liver enzyme elevation. She is in contact with behavioral health and a psychiatrist. Encourage follow-up with both.

## 2024-01-11 NOTE — Progress Notes (Signed)
 Bethanie Dicker, NP-C Phone: [phone]  Valerie Morales is a 48 y.o. female who presents today for follow up.   Discussed the use of AI scribe software for clinical note transcription with the patient, who gave verbal consent to proceed.  History of Present Illness   Valerie Morales "Valerie Morales" is a 48 year old female who presents for a three-month follow-up.  She has a history of elevated liver enzymes, with recent blood work confirming continued elevation. She has a history of fatty liver. She consumes alcohol daily, typically one or two 25-ounce beers per day, attributing increased consumption to stress from work and family issues. She does not drink to the point of passing out.  Her current medications include Effexor, Norvasc, olmesartan/hydrochlorothiazide, and Zofran, taken daily. She previously discontinued Topamax due to adverse effects such as feeling 'weird' and sleepy, and she is no longer taking trazodone.  No chest pain, shortness of breath, or dizziness. She reports occasional lower abdominal pain, which she attributes to her bladder, and a sensation of 'knotting' in the right upper quadrant when bending the wrong way.      Social History   Tobacco Use  Smoking Status Never  Smokeless Tobacco Never    Current Outpatient Medications on File Prior to Visit  Medication Sig Dispense Refill   amLODipine (NORVASC) 5 MG tablet Take 1 tablet (5 mg total) by mouth every morning. 90 tablet 3   cetirizine (ZYRTEC) 10 MG tablet Take 10 mg by mouth as needed.     diphenhydrAMINE (BENADRYL) 25 MG tablet Take 25 mg by mouth daily as needed for allergies.      docusate sodium (COLACE) 100 MG capsule Take 1 capsule (100 mg total) by mouth 2 (two) times daily. To keep stools soft 30 capsule 0   EPINEPHrine 0.3 mg/0.3 mL IJ SOAJ injection Inject 0.3 mg into the muscle as needed for anaphylaxis. 1 each 1   estradiol (VIVELLE-DOT) 0.1 MG/24HR patch Place onto the skin 2 (two) times a  week.     hydrOXYzine (ATARAX) 25 MG tablet Take 1 tablet (25 mg total) by mouth 3 (three) times daily as needed for anxiety. For severe anxiety attacks 90 tablet 1   ibuprofen (ADVIL) 800 MG tablet Take 1 tablet (800 mg total) by mouth every 8 (eight) hours as needed. 30 tablet 0   olmesartan-hydrochlorothiazide (BENICAR HCT) 20-12.5 MG tablet Take 1 tablet by mouth every morning. 90 tablet 3   ondansetron (ZOFRAN) 4 MG tablet Take 1 tablet (4 mg total) by mouth every 8 (eight) hours as needed for nausea or vomiting. 30 tablet 5   venlafaxine (EFFEXOR) 100 MG tablet TAKE ONE-HALF TABLET BY MOUTH TWICE DAILY. STOP EFFEXOR XR 150MG  AND 37.5MG . 30 tablet 0   topiramate (TOPAMAX) 25 MG tablet Take 1 tablet (25 mg total) by mouth daily. (Patient not taking: Reported on 01/11/2024) 30 tablet 1   No current facility-administered medications on file prior to visit.     ROS see history of present illness  Objective  Physical Exam Vitals:   01/11/24 1016  BP: 118/82  Pulse: 95  Temp: 98 F (36.7 C)  SpO2: 97%    BP Readings from Last 3 Encounters:  01/11/24 118/82  10/11/23 118/82  07/28/23 (!) 134/95   Wt Readings from Last 3 Encounters:  01/11/24 210 lb 12.8 oz (95.6 kg)  10/11/23 209 lb 9.6 oz (95.1 kg)  07/20/23 210 lb 1.6 oz (95.3 kg)    Physical Exam Constitutional:  General: She is not in acute distress.    Appearance: Normal appearance.  HENT:     Head: Normocephalic.  Cardiovascular:     Rate and Rhythm: Normal rate and regular rhythm.     Heart sounds: Normal heart sounds.  Pulmonary:     Effort: Pulmonary effort is normal.     Breath sounds: Normal breath sounds.  Abdominal:     General: Abdomen is flat. Bowel sounds are normal.     Palpations: Abdomen is soft.     Tenderness: There is no abdominal tenderness.  Skin:    General: Skin is warm and dry.  Neurological:     General: No focal deficit present.     Mental Status: She is alert.  Psychiatric:         Mood and Affect: Mood normal.        Behavior: Behavior normal.     Assessment/Plan: Please see individual problem list.  Elevated liver enzymes Assessment & Plan: Liver enzymes are elevated in recent lab work, likely due to fatty liver and alcohol consumption. Other causes are being evaluated. Repeat liver function tests and order a liver ultrasound. Consider a gastroenterology referral if the ultrasound shows progression.  Orders: -     Hepatic function panel -     US ABDOMEN LIMITED RUQ (LIVER/GB); Future  Alcohol abuse Assessment & Plan: Daily alcohol consumption, especially during stress, may contribute to liver enzyme elevation. She is in contact with behavioral health and a psychiatrist. Encourage follow-up with both.   Essential hypertension Assessment & Plan: Blood pressure is well-controlled with current medications. Continue Norvasc 5 mg daily and Olmesartan-hydrochlorothiazide 20-12.5 mg daily. Lab work as outlined.   Orders: -     CBC with Differential/Platelet -     Basic metabolic panel  Hyperlipidemia, unspecified hyperlipidemia type Assessment & Plan: Diet controlled. Encourage healthy diet and regular exercise. We will check lipid panel today.   Orders: -     Lipid panel  Moderate episode of recurrent major depressive disorder [facility]) Assessment & Plan: Managed by Psychiatry. Currently taking Effexor 100 mg daily. She did not like how Topamax made her feel, she has since stopped taking. Continue Effexor. She is encouraged to decrease alcohol intake and to follow up with psychiatry as scheduled. Therapy sessions will continue. Encouraged to contact if worsening symptoms, unusual behavior changes or suicidal thoughts occur.    B12 deficiency -     Vitamin B12  Obesity (BMI 30-39.9) -     Hemoglobin A1c  Thyroid disorder screen -     TSH    Return in about 6 months (around 07/13/2024) for Follow up.   Bethanie Dicker, NP-C [facility] -  [facility]

## 2024-01-12 ENCOUNTER — Telehealth: Payer: Self-pay | Admitting: Nurse Practitioner

## 2024-01-12 LAB — VITAMIN B12: Vitamin B-12: 148 pg/mL — ABNORMAL LOW (ref 211–911)

## 2024-01-12 LAB — TSH: TSH: 3.96 u[IU]/mL (ref 0.35–5.50)

## 2024-01-12 NOTE — Telephone Encounter (Signed)
 Lft pt vm to call ofc to [facility] [place]. thanks ?

## 2024-01-16 ENCOUNTER — Telehealth: Payer: Self-pay | Admitting: Nurse Practitioner

## 2024-01-16 NOTE — Telephone Encounter (Signed)
 Lft pt vm to call ofc to [facility] [place]. thanks

## 2024-01-17 ENCOUNTER — Telehealth: Payer: Self-pay | Admitting: Nurse Practitioner

## 2024-01-17 NOTE — Telephone Encounter (Signed)
 Lft pt vm to call ofc to [facility] [place]. thanks ?

## 2024-01-19 ENCOUNTER — Other Ambulatory Visit: Payer: Self-pay | Admitting: Nurse Practitioner

## 2024-01-19 DIAGNOSIS — E538 Deficiency of other specified B group vitamins: Secondary | ICD-10-CM

## 2024-01-19 MED ORDER — VITAMIN B-12 1000 MCG PO TABS: 1000.0000 ug | ORAL_TABLET | Freq: Every day | ORAL | 3 refills | Status: AC

## 2024-01-20 ENCOUNTER — Telehealth: Admitting: Psychiatry

## 2024-01-20 ENCOUNTER — Encounter: Payer: Self-pay | Admitting: Psychiatry

## 2024-01-20 DIAGNOSIS — F419 Anxiety disorder, unspecified: Secondary | ICD-10-CM | POA: Diagnosis not present

## 2024-01-20 DIAGNOSIS — F32A Depression, unspecified: Secondary | ICD-10-CM | POA: Diagnosis not present

## 2024-01-20 DIAGNOSIS — F1010 Alcohol abuse, uncomplicated: Secondary | ICD-10-CM

## 2024-01-20 DIAGNOSIS — G4700 Insomnia, unspecified: Secondary | ICD-10-CM | POA: Diagnosis not present

## 2024-01-20 MED ORDER — VENLAFAXINE HCL 100 MG PO TABS
50.0000 mg | ORAL_TABLET | Freq: Two times a day (BID) | ORAL | 1 refills | Status: DC
Start: 2024-01-20 — End: 2024-04-19

## 2024-01-20 MED ORDER — TOPIRAMATE 25 MG PO TABS: 25.0000 mg | ORAL_TABLET | Freq: Two times a day (BID) | ORAL | 1 refills | Status: DC
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
## 2024-01-23 ENCOUNTER — Ambulatory Visit
Admission: RE | Admit: 2024-01-23 | Discharge: 2024-01-23 | Disposition: A | Discharge status: Home / Self Care | Source: Ambulatory Visit | Attending: Nurse Practitioner | Admitting: Nurse Practitioner

## 2024-01-23 DIAGNOSIS — R748 Abnormal levels of other serum enzymes: Secondary | ICD-10-CM | POA: Diagnosis present

## 2024-01-24 ENCOUNTER — Encounter: Payer: Self-pay | Admitting: Nurse Practitioner

## 2024-03-05 ENCOUNTER — Telehealth: Admitting: Psychiatry

## 2024-03-05 ENCOUNTER — Encounter: Payer: Self-pay | Admitting: Psychiatry

## 2024-03-05 DIAGNOSIS — F330 Major depressive disorder, recurrent, mild: Secondary | ICD-10-CM

## 2024-03-05 DIAGNOSIS — F1010 Alcohol abuse, uncomplicated: Secondary | ICD-10-CM

## 2024-03-05 DIAGNOSIS — G4701 Insomnia due to medical condition: Secondary | ICD-10-CM

## 2024-03-05 DIAGNOSIS — F418 Other specified anxiety disorders: Secondary | ICD-10-CM | POA: Diagnosis not present

## 2024-03-05 NOTE — Progress Notes (Signed)
 Virtual Visit via Video Note  I connected with Valerie Morales on 03/05/24 at  4:20 PM EDT by a video enabled telemedicine application and verified that I am speaking with the correct person using two identifiers.  Location Provider Location : [facility] Patient Location : Home  Participants: Patient , Provider   I discussed the limitations of evaluation and management by telemedicine and the availability of in person appointments. The patient expressed understanding and agreed to proceed.   I discussed the assessment and treatment plan with the patient. The patient was provided an opportunity to ask questions and all were answered. The patient agreed with the plan and demonstrated an understanding of the instructions.   The patient was advised to call back or seek an in-person evaluation if the symptoms worsen or if the condition fails to improve as anticipated.  BH MD OP Progress Note  03/05/2024 4:40 PM Valerie Morales  MRN:  [id number]  Chief Complaint:  Chief Complaint  Patient presents with   Follow-up   Anxiety   Depression   Medication Refill   Drug / Alcohol Assessment   Discussed the use of AI scribe software for clinical note transcription with the patient, who gave verbal consent to proceed.  History of Present Illness Valerie Morales "Valerie Morales" is a 48 year old African-American female, employed, currently lives in [place], married, has a history of depression, anxiety, insomnia, alcohol use disorder, essential hypertension, hemorrhagic cyst in the left ovary status post hysterectomy, history of gastric bypass, history of obstructive sleep apnea likely mild, noncompliant on CPAP was evaluated by telemedicine today.  Her anxiety has been improving, allowing her to start going into places like [employer] by herself. However, she still experiences symptoms such as sweating and feeling the need to keep moving, which she manages by using headphones to distract  herself.  She described mood swings previously with episodes of high energy and rapid speech that previously occurred three to four times a week, but she has not experienced these recently.  The last time she had these episodes was several months ago.  Instead, she mentions 'shutting down a lot'.  She acknowledges continued alcohol use, particularly during stressful situations, such as a recent argument with her husband. She drank wine and beer on that occasion and stayed up all night.  She continues to use alcohol as a coping tool.  She has not been able to find a therapist however is motivated to start therapy.  She currently denies any suicidality, homicidality or perceptual disturbances.  She recently missed her venlafaxine  dosage and had withdrawal symptoms like having hot flashes and felt sick.  She agrees to stay compliant on her medications as prescribed.  Denies any side effects.  She denies any other concerns today.    Visit Diagnosis:    ICD-10-CM   1. Mild episode of recurrent major depressive disorder (HCC)  F33.0     2. Other specified anxiety disorders  F41.8    With limited symptom attacks    3. Insomnia due to medical condition  G47.01    mood , lack of sleep hygiene, alcohol use    4. Alcohol use disorder, mild, abuse  F10.10       Past Psychiatric History: I have reviewed past psychiatric history from progress note on 09/14/2023.  Past trials of medications like lorazepam , venlafaxine .  Past Medical History:  Past Medical History:  Diagnosis Date   Anemia    has required 2 units PRBC  Anxiety    Arthritis    B12 deficiency    Back pain    Bipolar depression (HCC)    Chest pain    Chicken pox    Depression    trazadone has not helped in the past    Fatty liver    H/O alcohol abuse    H/O gastric bypass    Headache    Hepatomegaly    High cholesterol    History of blood transfusion 2009   [place]   Hypertension    Morbid obesity (HCC)    MRSA  (methicillin resistant Staphylococcus aureus)    Ovarian cyst    Pelvic pain    Scoliosis of thoracic spine    Shortness of breath on exertion    Sinus tachycardia    Sinusitis    Sinusitis, chronic    Sleep apnea    pt states mild case and did not get cpap   UTI (urinary tract infection)    Vitamin D  deficiency     Past Surgical History:  Procedure Laterality Date   ANKLE ARTHROSCOPY Left 05/09/2020   Procedure: ANKLE ARTHROSCOPY OCD REPAIR LEFT;  Surgeon: Anell Baptist, DPM;  Location: [facility];  Service: Podiatry;  Laterality: Left;   CESAREAN SECTION     x3 1995, 1996, 1998   CHOLECYSTECTOMY  1998   COLONOSCOPY WITH PROPOFOL  N/A 05/01/2019   Procedure: COLONOSCOPY WITH PROPOFOL ;  Surgeon: Marnee Sink, MD;  Location: [facility];  Service: Endoscopy;  Laterality: N/A;   CYSTOSCOPY N/A 07/28/2023   Procedure: CYSTOSCOPY;  Surgeon: Prescilla Brod, MD;  Location: [facility];  Service: Gynecology;  Laterality: N/A;   ESOPHAGOGASTRODUODENOSCOPY (EGD) WITH PROPOFOL  N/A 05/01/2019   Procedure: ESOPHAGOGASTRODUODENOSCOPY (EGD) WITH PROPOFOL ;  Surgeon: Marnee Sink, MD;  Location: [facility];  Service: Endoscopy;  Laterality: N/A;   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 [facility]    GRAFT APPLICATION Left 12/04/2021   Procedure: GRAFT APPLICATION - CALCANEAL AUTOGRAFT;  Surgeon: Anell Baptist, DPM;  Location: [facility];  Service: Podiatry;  Laterality: Left;   NASAL SINUS SURGERY     2009/2010   ORIF CALCANEOUS FRACTURE Left 12/04/2021   Procedure: REPAIR NONUNION - CALCANEAL EVANS OSTEOTOMY SITE;  Surgeon: Anell Baptist, DPM;  Location: [facility];  Service: Podiatry;  Laterality: Left;   OSTECTOMY Left 05/09/2020   Procedure: EVANS/MCDO LEFT;  Surgeon: Anell Baptist, DPM;  Location: [facility];  Service: Podiatry;  Laterality: Left;   ROBOTIC ASSISTED TOTAL HYSTERECTOMY WITH BILATERAL SALPINGO OOPHERECTOMY N/A 07/28/2023   Procedure: XI ROBOTIC ASSISTED TOTAL  HYSTERECTOMY WITH BILATERAL SALPINGO OOPHORECTOMY;  Surgeon: Prescilla Brod, MD;  Location: [facility];  Service: Gynecology;  Laterality: N/A;   TENDON TRANSFER Left 05/09/2020   Procedure: FDL TRANSFER;DEEP LEFT;  Surgeon: Anell Baptist, DPM;  Location: [facility];  Service: Podiatry;  Laterality: Left;   TUBAL LIGATION     uterine ablation  2010   WOUND DEBRIDEMENT Left 05/09/2020   Procedure: A-SCOPE/DEBRIDEMENT/EXTENSIVE LEFT;  Surgeon: Anell Baptist, DPM;  Location: [facility];  Service: Podiatry;  Laterality: Left;    Family Psychiatric History: I have reviewed family psychiatric history from progress note on 09/14/2023.  Family History:  Family History  Problem Relation Age of Onset   Bipolar disorder Mother    Obesity Mother    Sleep apnea Mother    Thyroid  disease Mother    Hyperlipidemia Mother    Cancer Mother 84       lung cancer-small cell  lung cancer    Heart disease Mother        MI with stents   Hypertension Mother    Diabetes Mother    COPD Mother    Hypertension Father    Hyperlipidemia Father    Cancer Maternal Aunt        breast cancer   Breast cancer Maternal Aunt    Alcohol abuse Maternal Aunt        Liver Failure   Cancer Maternal Uncle        colon cancer   Alcohol abuse Maternal Grandfather    Heart disease Maternal Grandfather    Cancer Maternal Grandmother        brain cancer    Heart disease Paternal Grandfather    Hypertension Paternal Grandfather    Arthritis Paternal Grandmother    Cancer Paternal Grandmother        leukemia    Hypertension Paternal Grandmother    Breast cancer Paternal Grandmother    Autism spectrum disorder Daughter    Mental illness Son     Social History: I have not social history from progress note on 09/14/2023. Social History   Socioeconomic History   Marital status: Married    Spouse name: Cornel Diesel   Number of children: 3   Years of education: 16   Highest education level: Not on file  Occupational History    Occupation: caseworker  Tobacco Use   Smoking status: Never   Smokeless tobacco: Never  Vaping Use   Vaping status: Never Used  Substance and Sexual Activity   Alcohol use: Yes    Alcohol/week: 4.0 standard drinks of alcohol    Types: 3 Glasses of wine, 1 Cans of beer per week    Comment: daily   Drug use: Not Currently    Types: Marijuana    Comment: 17 YEARS AGO   Sexual activity: Yes    Birth control/protection: None, Surgical    Comment: tubal ligation  Other Topics Concern   Not on file  Social History Narrative   Valerie "Valerie Morales" grew up in [place], [place]. She attended [employer] and obtained her Bachelors in [employer]. She lives in [place] with her 3 children and her husband       Caffeine - 2 coffee, no sodas   Exercise - not currently   Bachelors degree    Caseworker    4 pregnancies, 3 live births    Feels safe in relationship    Wears seatbelt   No guns       Social Drivers of Corporate investment banker Strain: Not on file  Food Insecurity: Not on file  Transportation Needs: Not on file  Physical Activity: Not on file  Stress: Not on file  Social Connections: Not on file    Allergies:  Allergies  Allergen Reactions   Other Hives     Nut allergy . Walnuts and hazelnuts are the worst.    Penicillins Itching    Hives    Percocet [Oxycodone -Acetaminophen ] Hives    Hives    Ambien [Zolpidem]     Hallucinations falls   Lipitor [Atorvastatin ]     Unable to tolerate h/o fatty liver with elevated lfts    Molds & Smuts Other (See Comments)    Congestion and sneezing    Aspirin  Itching    hives   Latex Rash    Hives .     Metabolic Disorder Labs: Lab Results  Component Value Date   HGBA1C 5.3  01/11/2024   MPG 100 11/11/2008   No results found for: "PROLACTIN" Lab Results  Component Value Date   CHOL 225 (H) 01/11/2024   TRIG 112.0 01/11/2024   HDL 72.90 01/11/2024   CHOLHDL 3 01/11/2024   VLDL 22.4 01/11/2024   LDLCALC 129 (H)  01/11/2024   LDLCALC 69 10/13/2021   Lab Results  Component Value Date   TSH 3.96 01/11/2024   TSH 2.220 05/05/2021    Therapeutic Level Labs: No results found for: "LITHIUM" No results found for: "VALPROATE" No results found for: "CBMZ"  Current Medications: Current Outpatient Medications  Medication Sig Dispense Refill   cyanocobalamin  (VITAMIN B12) 1000 MCG tablet Take 1 tablet (1,000 mcg total) by mouth daily. 90 tablet 3   amLODipine  (NORVASC ) 5 MG tablet Take 1 tablet (5 mg total) by mouth every morning. 90 tablet 3   cetirizine (ZYRTEC) 10 MG tablet Take 10 mg by mouth as needed.     diphenhydrAMINE  (BENADRYL ) 25 MG tablet Take 25 mg by mouth daily as needed for allergies.      docusate sodium  (COLACE) 100 MG capsule Take 1 capsule (100 mg total) by mouth 2 (two) times daily. To keep stools soft 30 capsule 0   EPINEPHrine  0.3 mg/0.3 mL IJ SOAJ injection Inject 0.3 mg into the muscle as needed for anaphylaxis. 1 each 1   estradiol  (VIVELLE -DOT) 0.1 MG/24HR patch Place onto the skin 2 (two) times a week.     hydrOXYzine  (ATARAX ) 25 MG tablet Take 1 tablet (25 mg total) by mouth 3 (three) times daily as needed for anxiety. For severe anxiety attacks 90 tablet 1   ibuprofen  (ADVIL ) 800 MG tablet Take 1 tablet (800 mg total) by mouth every 8 (eight) hours as needed. 30 tablet 0   olmesartan -hydrochlorothiazide  (BENICAR  HCT) 20-12.5 MG tablet Take 1 tablet by mouth every morning. 90 tablet 3   ondansetron  (ZOFRAN ) 4 MG tablet Take 1 tablet (4 mg total) by mouth every 8 (eight) hours as needed for nausea or vomiting. 30 tablet 5   topiramate  (TOPAMAX ) 25 MG tablet Take 1 tablet (25 mg total) by mouth 2 (two) times daily. 60 tablet 1   traZODone  (DESYREL ) 50 MG tablet Take 50 mg by mouth at bedtime as needed for sleep.     venlafaxine  (EFFEXOR ) 100 MG tablet Take 0.5 tablets (50 mg total) by mouth 2 (two) times daily. 30 tablet 1   No current facility-administered medications for this  visit.     Musculoskeletal: Strength & Muscle Tone: UTA Gait & Station: Seated Patient leans: N/A  Psychiatric Specialty Exam: Review of Systems  Psychiatric/Behavioral:  Positive for sleep disturbance. The patient is nervous/anxious.     There were no vitals taken for this visit.There is no height or weight on file to calculate BMI.  General Appearance: Casual  Eye Contact:  Fair  Speech:  Clear and Coherent  Volume:  Normal  Mood:  Anxious  Affect:  Appropriate  Thought Process:  Goal Directed and Descriptions of Associations: Intact  Orientation:  Full (Time, Place, and Person)  Thought Content: Logical   Suicidal Thoughts:  No  Homicidal Thoughts:  No  Memory:  Immediate;   Fair Recent;   Fair Remote;   Fair  Judgement:  Fair  Insight:  Fair  Psychomotor Activity:  Normal  Concentration:  Concentration: Fair and Attention Span: Fair  Recall:  [employer] of Knowledge: Fair  Language: Fair  Akathisia:  No  Handed:  Right  AIMS (if indicated): not done  Assets:  Communication Skills Desire for Improvement Housing Social Support Transportation  ADL's:  Intact  Cognition: WNL  Sleep:  restless    Screenings: AUDIT    Loss adjuster, chartered Office Visit [facility]14/[facility]23 in [facility]  Alcohol Use Disorder Identification Test Final Score (AUDIT) 16      GAD-7    Flowsheet Row Office Visit from 01/11/2024 in [facility] Visit from 10/11/2023 in [facility] Visit from 09/14/2023 in [facility] Office Visit from 06/02/2023 in [facility] Visit from 01/27/2023 in [facility] [employer] [facility]  Total GAD-7 Score 15 19 17 3 4       PHQ2-9    Flowsheet Row Office Visit from 01/11/2024 in [facility] Visit from 10/11/2023 in [facility] [employer] Visit from 09/14/2023 in [facility] Office Visit from 06/02/2023 in [facility] [employer] Visit from 01/27/2023 in [facility] [facility] [employer] [facility]  PHQ-2 Total Score 2 4 5 3 2   PHQ-9 Total Score 11 17 17 12 9       Flowsheet Row Video Visit from 03/05/2024 in [facility] Video Visit from 01/20/2024 in [facility] Office Visit from 12/21/2023 in [facility]  C-SSRS RISK CATEGORY No Risk No Risk No Risk        Assessment and Plan: Valerie Morales is a 48 year old African-American female with history of depression, anxiety, alcohol use disorder was evaluated by telemedicine today.  Discussed assessment and plan as noted below.  Alcohol use disorder-unstable Patient continues to use alcohol as a coping tool, recently had episodic use of alcohol when she was in high stress situations. - I have sent a referral to Mr. Malvina Searle, [facility] for counseling. - Continue Topamax  25 mg twice daily. - Provided education and brief supportive therapy.  MDD-unstable Continues to experience mood fluctuations, continues to use alcohol as noted above and has not been compliant with venlafaxine  as prescribed. - Encouraged compliance with medications as prescribed. - Continue Venlafaxine  100 mg, take half tablet twice daily. - Continue Hydroxyzine  25 mg 3 times a day as needed.  Other specified anxiety disorder-limited symptom attacks-improving Currently reports anxiety symptoms have improved although continues to be not very compliant with medications as prescribed.  Anxiety symptoms also likely triggered by continued alcohol use as a coping tool. - Continue Venlafaxine  100 mg  daily - Continue Hydroxyzine  as prescribed.  Insomnia-unstable Sleep problems mostly due to situational stressors as well as alcohol use. - Continue Trazodone  50 mg at bedtime as needed - Encourage abstinence from alcohol.  Follow-up Follow-up in clinic in 3 months.  Patient encouraged to engage in substance use treatment /counseling in the meantime.      Collaboration of Care: Collaboration of Care: Referral or follow-up with counselor/therapist AEB encouraged to attend psychotherapy/counseling for alcoholism.  I have sent the referral as well as provided information for community resources including [facility].  Patient/Guardian was advised Release of Information must be obtained prior to any record release in order to collaborate their care with an outside provider. Patient/Guardian was advised if they have not already done so to contact the  registration department to sign all necessary forms in order for us  to release information regarding their care.   Consent: Patient/Guardian gives verbal consent for treatment and assignment of benefits for services provided during this visit. Patient/Guardian expressed understanding and agreed to proceed.  This note was generated in part or whole with voice recognition software. Voice recognition is usually quite accurate but there are transcription errors that can and very often do occur. I apologize for any typographical errors that were not detected and corrected.     Khayden Herzberg, MD 03/06/2024, 10:05 AM

## 2024-03-06 ENCOUNTER — Telehealth (HOSPITAL_COMMUNITY): Payer: Self-pay | Admitting: Licensed Clinical Social Worker

## 2024-03-06 NOTE — Telephone Encounter (Signed)
 Note in error.

## 2024-03-06 NOTE — Telephone Encounter (Signed)
 The therapist attempts to reach [facility] at the request of Dr. Tere Felts leaving a HIPAA-compliant voicemail.  Melynda Stagger, MA, LCSW, [facility], LCAS 03/06/2024

## 2024-03-24 ENCOUNTER — Other Ambulatory Visit: Payer: Self-pay | Admitting: Medical Genetics

## 2024-04-19 ENCOUNTER — Other Ambulatory Visit: Payer: Self-pay | Admitting: Psychiatry

## 2024-04-19 DIAGNOSIS — F419 Anxiety disorder, unspecified: Secondary | ICD-10-CM

## 2024-04-19 DIAGNOSIS — F32A Depression, unspecified: Secondary | ICD-10-CM

## 2024-04-21 ENCOUNTER — Other Ambulatory Visit: Payer: Self-pay | Admitting: Nurse Practitioner

## 2024-04-21 DIAGNOSIS — R110 Nausea: Secondary | ICD-10-CM

## 2024-05-09 ENCOUNTER — Telehealth: Admitting: Physician Assistant

## 2024-05-09 ENCOUNTER — Encounter: Payer: Self-pay | Admitting: Physician Assistant

## 2024-05-09 DIAGNOSIS — R1011 Right upper quadrant pain: Secondary | ICD-10-CM

## 2024-05-09 NOTE — Progress Notes (Signed)
 Virtual Visit Consent   Valerie Morales, you are scheduled for a virtual visit with a [facility] provider today. Just as with appointments in the office, your consent must be obtained to participate. Your consent will be active for this visit and any virtual visit you may have with one of our providers in the next 365 days. If you have a MyChart account, a copy of this consent can be sent to you electronically.  As this is a virtual visit, video technology does not allow for your provider to perform a traditional examination. This may limit your provider's ability to fully assess your condition. If your provider identifies any concerns that need to be evaluated in person or the need to arrange testing (such as labs, EKG, etc.), we will make arrangements to do so. Although advances in technology are sophisticated, we cannot ensure that it will always work on either your end or our end. If the connection with a video visit is poor, the visit may have to be switched to a telephone visit. With either a video or telephone visit, we are not always able to ensure that we have a secure connection.  By engaging in this virtual visit, you consent to the provision of healthcare and authorize for your insurance to be billed (if applicable) for the services provided during this visit. Depending on your insurance coverage, you may receive a charge related to this service.  I need to obtain your verbal consent now. Are you willing to proceed with your visit today? [name] has provided verbal consent on 05/09/2024 for a virtual visit (video or telephone). Delon CHRISTELLA Dickinson, PA-C  Date: 05/09/2024 9:23 AM   Virtual Visit via Video Note   I, Delon CHRISTELLA Dickinson, connected with  Valerie Morales  ([id number], 04-Oct-1976) on 05/09/24 at  8:30 AM EDT by a video-enabled telemedicine application and verified that I am speaking with the correct person using two identifiers.  Location: Patient: Virtual  Visit Location Patient: Home Provider: Virtual Visit Location Provider: Home Office   I discussed the limitations of evaluation and management by telemedicine and the availability of in person appointments. The patient expressed understanding and agreed to proceed.    History of Present Illness: Valerie Morales is a 48 y.o. who identifies as a female who was assigned female at birth, and is being seen today for right sided chest pain under right breast. Onset was a couple of weeks ago and has been waxing and waning. No trigger noted for when it worsens. Denies any associated symptoms of nausea, vomiting, jaundice, shortness of breath, cough, fever, chills.   She does have history of elevated liver enzymes. Last check was March 2025 and enzymes were coming down and US  revealed fatty liver. She is s/p cholecystectomy.     Problems:  Patient Active Problem List   Diagnosis Date Noted   Obesity (BMI 30-39.9) 01/11/2024   High risk medication use 12/21/2023   Anxiety disorder 09/14/2023   Significant use of alcohol 09/14/2023   Insomnia 09/14/2023   Alcohol abuse 05/05/2021   B12 deficiency 03/20/2021   Hyperlipidemia 03/19/2021   Scoliosis of thoracic spine 03/19/2021   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   FH: heart disease 03/05/2021   Atypical chest pain 03/05/2021   Hepatomegaly 03/05/2021   OSA (obstructive sleep apnea) 02/02/2021   Sinus tachycardia 05/28/2020   S/P foot surgery, left 05/28/2020   Fatty liver 02/07/2020   Sinus tarsi syndrome of left ankle 01/24/2020  Elevated liver enzymes 07/25/2019   Allergic rhinitis 08/11/2018   Chronic sinusitis 08/11/2018   Vitamin D  deficiency 04/11/2018   Abnormal mammogram of left breast 04/11/2018   Plantar fasciitis 01/09/2018   Essential hypertension 12/18/2017   Depression 12/18/2017   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   Anemia 12/15/2017   Pelvic pain 12/15/2017   Breast pain 12/15/2017   Chronic pain of left ankle  12/15/2017   Annual physical exam 01/02/2014   Vaginal discharge 01/02/2014   Fatigue 12/21/2013   Hot flashes 12/21/2013   Sleep disturbance 12/21/2013    Allergies:  Allergies  Allergen Reactions   Other Hives     Nut allergy . Walnuts and hazelnuts are the worst.    Penicillins Itching    Hives    Percocet [Oxycodone -Acetaminophen ] Hives    Hives    Ambien [Zolpidem]     Hallucinations falls   Lipitor [Atorvastatin ]     Unable to tolerate h/o fatty liver with elevated lfts    Molds & Smuts Other (See Comments)    Congestion and sneezing    Aspirin  Itching    hives   Latex Rash    Hives .    Medications:  Current Outpatient Medications:    cyanocobalamin  (VITAMIN B12) 1000 MCG tablet, Take 1 tablet (1,000 mcg total) by mouth daily., Disp: 90 tablet, Rfl: 3   amLODipine  (NORVASC ) 5 MG tablet, Take 1 tablet (5 mg total) by mouth every morning., Disp: 90 tablet, Rfl: 3   cetirizine (ZYRTEC) 10 MG tablet, Take 10 mg by mouth as needed., Disp: , Rfl:    diphenhydrAMINE  (BENADRYL ) 25 MG tablet, Take 25 mg by mouth daily as needed for allergies. , Disp: , Rfl:    docusate sodium  (COLACE) 100 MG capsule, Take 1 capsule (100 mg total) by mouth 2 (two) times daily. To keep stools soft, Disp: 30 capsule, Rfl: 0   EPINEPHrine  0.3 mg/0.3 mL IJ SOAJ injection, Inject 0.3 mg into the muscle as needed for anaphylaxis., Disp: 1 each, Rfl: 1   estradiol  (VIVELLE -DOT) 0.1 MG/24HR patch, Place onto the skin 2 (two) times a week., Disp: , Rfl:    hydrOXYzine  (ATARAX ) 25 MG tablet, Take 1 tablet (25 mg total) by mouth 3 (three) times daily as needed for anxiety. For severe anxiety attacks, Disp: 90 tablet, Rfl: 1   ibuprofen  (ADVIL ) 800 MG tablet, Take 1 tablet (800 mg total) by mouth every 8 (eight) hours as needed., Disp: 30 tablet, Rfl: 0   olmesartan -hydrochlorothiazide  (BENICAR  HCT) 20-12.5 MG tablet, Take 1 tablet by mouth every morning., Disp: 90 tablet, Rfl: 3   ondansetron  (ZOFRAN ) 4 MG  tablet, TAKE 1 TABLET BY MOUTH EVERY 8 HOURS AS NEEDED FOR NAUSEA FOR VOMITING, Disp: 30 tablet, Rfl: 0   topiramate  (TOPAMAX ) 25 MG tablet, Take 1 tablet (25 mg total) by mouth 2 (two) times daily., Disp: 60 tablet, Rfl: 1   traZODone  (DESYREL ) 50 MG tablet, Take 50 mg by mouth at bedtime as needed for sleep., Disp: , Rfl:    venlafaxine  (EFFEXOR ) 100 MG tablet, Take 1/2 (one-half) tablet by mouth twice daily, Disp: 30 tablet, Rfl: 0  Observations/Objective: Patient is well-developed, well-nourished in no acute distress.  Resting comfortably at home.  Head is normocephalic, atraumatic.  No labored breathing.  Speech is clear and coherent with logical content.  Patient is alert and oriented at baseline.    Assessment and Plan: 1. Right upper quadrant pain (Primary)  - Waxing and waning pain - DDx:  MSK pain, costochondritis, inflammation of liver again, scar tissue - Advised could use Ibuprofen  600mg  at least once daily, avoid tylenol  - Warm compresses - Epsom salt soaks if feels comfortable getting in and out of tub - Seek in person evaluation if not improving or if pain worsens  Follow Up Instructions: I discussed the assessment and treatment plan with the patient. The patient was provided an opportunity to ask questions and all were answered. The patient agreed with the plan and demonstrated an understanding of the instructions.  A copy of instructions were sent to the patient via MyChart unless otherwise noted below.    The patient was advised to call back or seek an in-person evaluation if the symptoms worsen or if the condition fails to improve as anticipated.    Delon CHRISTELLA Dickinson, PA-C

## 2024-05-09 NOTE — Patient Instructions (Signed)
 Valerie Morales, thank you for joining Delon CHRISTELLA Dickinson, PA-C for today's virtual visit.  While this provider is not your primary care provider (PCP), if your PCP is located in our provider database this encounter information will be shared with them immediately following your visit.   A [facility] MyChart account gives you access to today's visit and all your visits, tests, and labs performed at [facility]  click here if you don't have a [facility] MyChart account or go to [email or web address]  Consent: (Patient) Valerie Morales provided verbal consent for this virtual visit at the beginning of the encounter.  Current Medications:  Current Outpatient Medications:    cyanocobalamin  (VITAMIN B12) 1000 MCG tablet, Take 1 tablet (1,000 mcg total) by mouth daily., Disp: 90 tablet, Rfl: 3   amLODipine  (NORVASC ) 5 MG tablet, Take 1 tablet (5 mg total) by mouth every morning., Disp: 90 tablet, Rfl: 3   cetirizine (ZYRTEC) 10 MG tablet, Take 10 mg by mouth as needed., Disp: , Rfl:    diphenhydrAMINE  (BENADRYL ) 25 MG tablet, Take 25 mg by mouth daily as needed for allergies. , Disp: , Rfl:    docusate sodium  (COLACE) 100 MG capsule, Take 1 capsule (100 mg total) by mouth 2 (two) times daily. To keep stools soft, Disp: 30 capsule, Rfl: 0   EPINEPHrine  0.3 mg/0.3 mL IJ SOAJ injection, Inject 0.3 mg into the muscle as needed for anaphylaxis., Disp: 1 each, Rfl: 1   estradiol  (VIVELLE -DOT) 0.1 MG/24HR patch, Place onto the skin 2 (two) times a week., Disp: , Rfl:    hydrOXYzine  (ATARAX ) 25 MG tablet, Take 1 tablet (25 mg total) by mouth 3 (three) times daily as needed for anxiety. For severe anxiety attacks, Disp: 90 tablet, Rfl: 1   ibuprofen  (ADVIL ) 800 MG tablet, Take 1 tablet (800 mg total) by mouth every 8 (eight) hours as needed., Disp: 30 tablet, Rfl: 0   olmesartan -hydrochlorothiazide  (BENICAR  HCT) 20-12.5 MG tablet, Take 1 tablet by mouth every morning., Disp: 90  tablet, Rfl: 3   ondansetron  (ZOFRAN ) 4 MG tablet, TAKE 1 TABLET BY MOUTH EVERY 8 HOURS AS NEEDED FOR NAUSEA FOR VOMITING, Disp: 30 tablet, Rfl: 0   topiramate  (TOPAMAX ) 25 MG tablet, Take 1 tablet (25 mg total) by mouth 2 (two) times daily., Disp: 60 tablet, Rfl: 1   traZODone  (DESYREL ) 50 MG tablet, Take 50 mg by mouth at bedtime as needed for sleep., Disp: , Rfl:    venlafaxine  (EFFEXOR ) 100 MG tablet, Take 1/2 (one-half) tablet by mouth twice daily, Disp: 30 tablet, Rfl: 0   Medications ordered in this encounter:  No orders of the defined types were placed in this encounter.    *If you need refills on other medications prior to your next appointment, please contact your pharmacy*  Follow-Up: Call back or seek an in-person evaluation if the symptoms worsen or if the condition fails to improve as anticipated.  [facility] [phone]    If you have been instructed to have an in-person evaluation today at a local Urgent Care facility, please use the link below. It will take you to a list of all of our available [facility], including address, phone number and hours of operation. Please do not delay care.  [facility]  If you or a family member do not have a primary care provider, use the link below to schedule a visit and establish care. When you choose a [facility] primary  care physician or advanced practice provider, you gain a long-term partner in health. Find a Primary Care Provider  Learn more about [facility] in-office and virtual care options: [facility] Get Care Now

## 2024-05-18 ENCOUNTER — Other Ambulatory Visit: Payer: Self-pay | Admitting: Psychiatry

## 2024-05-18 DIAGNOSIS — F32A Depression, unspecified: Secondary | ICD-10-CM

## 2024-05-18 DIAGNOSIS — F419 Anxiety disorder, unspecified: Secondary | ICD-10-CM

## 2024-06-04 ENCOUNTER — Telehealth (INDEPENDENT_AMBULATORY_CARE_PROVIDER_SITE_OTHER): Admitting: Psychiatry

## 2024-06-04 DIAGNOSIS — F419 Anxiety disorder, unspecified: Secondary | ICD-10-CM

## 2024-06-04 NOTE — Progress Notes (Signed)
 Patient unable to complete evaluation due to connection problems/network issue.  I have communicated with staff to reschedule this appointment.

## 2024-06-14 ENCOUNTER — Emergency Department

## 2024-06-14 ENCOUNTER — Other Ambulatory Visit: Payer: Self-pay

## 2024-06-14 ENCOUNTER — Encounter: Payer: Self-pay | Admitting: Emergency Medicine

## 2024-06-14 ENCOUNTER — Emergency Department
Admission: EM | Admit: 2024-06-14 | Discharge: 2024-06-14 | Disposition: A | Discharge status: Home / Self Care | Attending: Emergency Medicine | Admitting: Emergency Medicine

## 2024-06-14 DIAGNOSIS — E876 Hypokalemia: Secondary | ICD-10-CM | POA: Diagnosis not present

## 2024-06-14 DIAGNOSIS — R1011 Right upper quadrant pain: Secondary | ICD-10-CM | POA: Insufficient documentation

## 2024-06-14 DIAGNOSIS — E871 Hypo-osmolality and hyponatremia: Secondary | ICD-10-CM | POA: Insufficient documentation

## 2024-06-14 LAB — URINALYSIS, ROUTINE W REFLEX MICROSCOPIC
Bilirubin Urine: NEGATIVE
Glucose, UA: NEGATIVE mg/dL
Hgb urine dipstick: NEGATIVE
Ketones, ur: NEGATIVE mg/dL
Leukocytes,Ua: NEGATIVE
Nitrite: NEGATIVE
Protein, ur: NEGATIVE mg/dL
Specific Gravity, Urine: 1.004 — ABNORMAL LOW (ref 1.005–1.030)
pH: 6 (ref 5.0–8.0)

## 2024-06-14 LAB — COMPREHENSIVE METABOLIC PANEL WITH GFR
ALT: 36 U/L (ref 0–44)
AST: 86 U/L — ABNORMAL HIGH (ref 15–41)
Albumin: 4 g/dL (ref 3.5–5.0)
Alkaline Phosphatase: 141 U/L — ABNORMAL HIGH (ref 38–126)
Anion gap: 18 — ABNORMAL HIGH (ref 5–15)
BUN: 6 mg/dL (ref 6–20)
CO2: 21 mmol/L — ABNORMAL LOW (ref 22–32)
Calcium: 9.2 mg/dL (ref 8.9–10.3)
Chloride: 92 mmol/L — ABNORMAL LOW (ref 98–111)
Creatinine, Ser: 0.67 mg/dL (ref 0.44–1.00)
GFR, Estimated: >60 mL/min (ref >60)
Glucose, Bld: 91 mg/dL (ref 70–99)
Potassium: 3.1 mmol/L — ABNORMAL LOW (ref 3.5–5.1)
Sodium: 131 mmol/L — ABNORMAL LOW (ref 135–145)
Total Bilirubin: 0.6 mg/dL (ref 0.0–1.2)
Total Protein: 8.2 g/dL — ABNORMAL HIGH (ref 6.5–8.1)

## 2024-06-14 LAB — CBC
HCT: 41.2 % (ref 36.0–46.0)
Hemoglobin: 14.4 g/dL (ref 12.0–15.0)
MCH: 34.6 pg — ABNORMAL HIGH (ref 26.0–34.0)
MCHC: 35 g/dL (ref 30.0–36.0)
MCV: 99 fL (ref 80.0–100.0)
Platelets: 240 K/uL (ref 150–400)
RBC: 4.16 MIL/uL (ref 3.87–5.11)
RDW: 14 % (ref 11.5–15.5)
WBC: 7.4 K/uL (ref 4.0–10.5)
nRBC: 0 % (ref 0.0–0.2)

## 2024-06-14 LAB — LIPASE, BLOOD: Lipase: 40 U/L (ref 11–51)

## 2024-06-14 MED ORDER — PANTOPRAZOLE SODIUM 40 MG IV SOLR
40.0000 mg | Freq: Once | INTRAVENOUS | Status: AC
  Administered 2024-06-14: 40 mg via INTRAVENOUS
  Filled 2024-06-14: qty 10

## 2024-06-14 MED ORDER — SUCRALFATE 1 G PO TABS: 1.0000 g | ORAL_TABLET | Freq: Three times a day (TID) | ORAL | 0 refills | Status: AC

## 2024-06-14 MED ORDER — FENTANYL CITRATE PF 50 MCG/ML IJ SOSY
50.0000 ug | PREFILLED_SYRINGE | Freq: Once | INTRAMUSCULAR | Status: AC
  Administered 2024-06-14: 50 ug via INTRAVENOUS
  Filled 2024-06-14: qty 1

## 2024-06-14 MED ORDER — IOHEXOL 300 MG/ML  SOLN
100.0000 mL | Freq: Once | INTRAMUSCULAR | Status: AC | PRN
  Administered 2024-06-14: 100 mL via INTRAVENOUS

## 2024-06-14 MED ORDER — ONDANSETRON HCL 4 MG/2ML IJ SOLN
4.0000 mg | Freq: Once | INTRAMUSCULAR | Status: DC
  Filled 2024-06-14: qty 2

## 2024-06-14 MED ORDER — SODIUM CHLORIDE 0.9 % IV BOLUS
500.0000 mL | Freq: Once | INTRAVENOUS | Status: AC
  Administered 2024-06-14: 500 mL via INTRAVENOUS

## 2024-06-14 NOTE — ED Triage Notes (Signed)
 Patient to ED via POV for right sided abd pain with vomiting. Ongoing for a couple of days.

## 2024-06-14 NOTE — ED Notes (Signed)
 Two unsuccessful IV attempts. Deferred to another staff member.

## 2024-06-14 NOTE — ED Notes (Signed)
 Patient ambulated with a steady gait to hallway bathroom

## 2024-06-14 NOTE — ED Notes (Signed)
 Patient asked for water and was told it would have to wait until after the CT scan was read.

## 2024-06-14 NOTE — ED Provider Notes (Signed)
 [facility] Provider Note    Event Date/Time   First MD Initiated Contact with Patient 06/14/24 1105     (approximate)   History   Abdominal Pain   HPI  Attallah ADAEZE BETTER is a 48 y.o. female with history of gastric bypass, fatty liver, history of cholecystectomy who presents with complaints of right upper quadrant abdominal pain, she reports has been hurting for sometimes but seems to be worsening.  Review of records demonstrates the patient had a ultrasound performed in April of this year which demonstrated fatty liver.  No fevers, mild nausea.     Physical Exam   Triage Vital Signs: ED Triage Vitals  Encounter Vitals Group     BP 06/14/24 1106 116/74     Girls Systolic BP Percentile --      Girls Diastolic BP Percentile --      Boys Systolic BP Percentile --      Boys Diastolic BP Percentile --      Pulse Rate 06/14/24 1106 (!) 101     Resp 06/14/24 1106 18     Temp 06/14/24 1106 98.4 F (36.9 C)     Temp Source 06/14/24 1106 Oral     SpO2 06/14/24 1106 99 %     Weight 06/14/24 1104 96.6 kg (213 lb)     Height 06/14/24 1104 1.6 m (5' 3)     Head Circumference --      Peak Flow --      Pain Score 06/14/24 1104 7     Pain Loc --      Pain Education --      Exclude from Growth Chart --     Most recent vital signs: Vitals:   06/14/24 1106 06/14/24 1419  BP: 116/74 108/78  Pulse: (!) 101 92  Resp: 18 16  Temp: 98.4 F (36.9 C)   SpO2: 99% 100%     General: Awake, no distress.  CV:  Good peripheral perfusion.  Resp:  Normal effort.  Abd:  No distention.  Tenderness in the right upper quadrant, no CVA tenderness, Other:     ED Results / Procedures / Treatments   Labs (all labs ordered are listed, but only abnormal results are displayed) Labs Reviewed  COMPREHENSIVE METABOLIC PANEL WITH GFR - Abnormal; Notable for the following components:      Result Value   Sodium 131 (*)    Potassium 3.1 (*)    Chloride 92 (*)    CO2  21 (*)    Total Protein 8.2 (*)    AST 86 (*)    Alkaline Phosphatase 141 (*)    Anion gap 18 (*)    All other components within normal limits  CBC - Abnormal; Notable for the following components:   MCH 34.6 (*)    All other components within normal limits  URINALYSIS, ROUTINE W REFLEX MICROSCOPIC - Abnormal; Notable for the following components:   Color, Urine STRAW (*)    APPearance CLEAR (*)    Specific Gravity, Urine 1.004 (*)    All other components within normal limits  LIPASE, BLOOD     EKG     RADIOLOGY CT abdomen pelvis    PROCEDURES:  Critical Care performed:   Procedures   MEDICATIONS ORDERED IN ED: Medications  ondansetron  (ZOFRAN ) injection 4 mg (0 mg Intravenous Hold 06/14/24 1228)  sodium chloride  0.9 % bolus 500 mL (0 mLs Intravenous Stopped 06/14/24 1418)  pantoprazole  (PROTONIX ) injection 40 mg (40  mg Intravenous Given 06/14/24 1222)  fentaNYL  (SUBLIMAZE ) injection 50 mcg (50 mcg Intravenous Given 06/14/24 1224)  iohexol  (OMNIPAQUE ) 300 MG/ML solution 100 mL (100 mLs Intravenous Contrast Given 06/14/24 1238)     IMPRESSION / MDM / ASSESSMENT AND PLAN / ED COURSE  I reviewed the triage vital signs and the nursing notes. Patient's presentation is most consistent with severe exacerbation of chronic illness.  Patient presents with worsening right upper quadrant abdominal pain, she has had this for some time but it does seem to worsen, and has been attributed possibly to fatty liver in the past.  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  LFTs not significantly changed from prior  CT scan is reassuring, no acute abnormalities, patient feeling improved after treatment, I will refer to GI for possible endoscopy to evaluate for gastritis other causes of upper abdominal pain, certainly her  pain could be from hepatic steatosis no indication for admission at this time, return precautions discussed      FINAL CLINICAL IMPRESSION(S) / ED DIAGNOSES   Final diagnoses:  Right upper quadrant abdominal pain     Rx / DC Orders   ED Discharge Orders          Ordered    sucralfate  (CARAFATE ) 1 g tablet  3 times daily with meals & bedtime        06/14/24 1410             Note:  This document was prepared using Dragon voice recognition software and may include unintentional dictation errors.   Arlander Charleston, MD 06/14/24 1420

## 2024-06-20 ENCOUNTER — Other Ambulatory Visit: Payer: Self-pay | Admitting: Psychiatry

## 2024-06-20 DIAGNOSIS — F32A Depression, unspecified: Secondary | ICD-10-CM

## 2024-06-20 DIAGNOSIS — F419 Anxiety disorder, unspecified: Secondary | ICD-10-CM

## 2024-06-30 ENCOUNTER — Other Ambulatory Visit: Payer: Self-pay | Admitting: Nurse Practitioner

## 2024-06-30 DIAGNOSIS — R110 Nausea: Secondary | ICD-10-CM

## 2024-07-02 ENCOUNTER — Telehealth (INDEPENDENT_AMBULATORY_CARE_PROVIDER_SITE_OTHER): Admitting: Psychiatry

## 2024-07-02 ENCOUNTER — Encounter: Payer: Self-pay | Admitting: Psychiatry

## 2024-07-02 DIAGNOSIS — F1010 Alcohol abuse, uncomplicated: Secondary | ICD-10-CM | POA: Diagnosis not present

## 2024-07-02 DIAGNOSIS — F330 Major depressive disorder, recurrent, mild: Secondary | ICD-10-CM

## 2024-07-02 DIAGNOSIS — F418 Other specified anxiety disorders: Secondary | ICD-10-CM | POA: Diagnosis not present

## 2024-07-02 DIAGNOSIS — G4701 Insomnia due to medical condition: Secondary | ICD-10-CM

## 2024-07-02 NOTE — Progress Notes (Unsigned)
 Virtual Visit via Video Note  I connected with Valerie Morales on 07/02/24 at  4:30 PM EDT by a video enabled telemedicine application and verified that I am speaking with the correct person using two identifiers.  Location Provider Location : [facility] Patient Location : Home  Participants: Patient , Provider   I discussed the limitations of evaluation and management by telemedicine and the availability of in person appointments. The patient expressed understanding and agreed to proceed.    I discussed the assessment and treatment plan with the patient. The patient was provided an opportunity to ask questions and all were answered. The patient agreed with the plan and demonstrated an understanding of the instructions.   The patient was advised to call back or seek an in-person evaluation if the symptoms worsen or if the condition fails to improve as anticipated.   BH MD OP Progress Note  07/02/2024 5:04 PM IRIDIAN READER  MRN:  [id number]  Chief Complaint:  Chief Complaint  Patient presents with   Follow-up   Anxiety   Depression   Medication Refill   Discussed the use of AI scribe software for clinical note transcription with the patient, who gave verbal consent to proceed.  History of Present Illness Valerie Morales) is a 48 year old African-American female, employed, currently lives in [place], married, has a history of depression, anxiety, insomnia, alcohol use disorder, essential hypertension, hemorrhagic cyst in the left ovary status post hysterectomy, history of gastric bypass, history of obstructive sleep apnea likely mild, noncompliant on CPAP was evaluated by telemedicine today.  Ongoing difficulty with alcohol use continues, as she describes drinking wine approximately 4 days per week, with her husband limiting her intake. She reports that her alcohol consumption has decreased from previous levels, but she acknowledges it is still excessive. She  reports that she faces challenges in accessing Alcoholics Anonymous meetings due to her work schedule and lack of transportation. She states that her motivation to reduce her drinking remains strong, and she actively seeks support options.  Persistent symptoms of depression and anxiety affect her, which she links in part to psychosocial stressors, including the recent anniversary of her mother's death and ongoing work-related pressures. She notes that her work schedule makes it difficult to access therapy or take time off without falling behind, contributing to her distress. She states that her job serves as her primary source of income, and she expresses concern about maintaining employment while seeking treatment.  She denies any current thoughts of hurting herself or others. Sleep disturbance persists, with her typically going to bed around 10 or 11 PM, waking up at 2 or 3 AM, and remaining awake until about 5 AM, averaging approximately 5 hours of sleep per night. She notes that sleep difficulties have persisted both before and after her alcohol use began. She reports that trazodone  caused disturbing dreams and that she has stopped taking it. She also stopped topiramate  due to sedation and work demands. She currently takes venlafaxine  (Effexor ) daily and uses hydroxyzine  25 mg up to 3 times daily as needed, with hydroxyzine  available for sleep. She has tried melatonin in the past, which was only partially effective and sometimes caused side effects and compliant with trazodone .    She has not engaged in therapy in the past, stating she has not had a therapist due to work schedule constraints.  She denies any current suicidality, homicidality or perceptual disturbances.   Visit Diagnosis:    ICD-10-CM   1. Mild episode of  recurrent major depressive disorder (HCC)  F33.0     2. Other specified anxiety disorders  F41.8     3. Insomnia due to medical condition  G47.01     4. Alcohol use disorder,  mild, abuse  F10.10       Past Psychiatric History: I have reviewed past psychiatric history from progress note on 09/14/2023.  Past trials of medications like lorazepam , venlafaxine .  Past Medical History:  Past Medical History:  Diagnosis Date   Anemia    has required 2 units PRBC   Anxiety    Arthritis    B12 deficiency    Back pain    Bipolar depression (HCC)    Chest pain    Chicken pox    Depression    trazadone has not helped in the past    Fatty liver    H/O alcohol abuse    H/O gastric bypass    Headache    Hepatomegaly    High cholesterol    History of blood transfusion 2009   [place]   Hypertension    Morbid obesity (HCC)    MRSA (methicillin resistant Staphylococcus aureus)    Ovarian cyst    Pelvic pain    Scoliosis of thoracic spine    Shortness of breath on exertion    Sinus tachycardia    Sinusitis    Sinusitis, chronic    Sleep apnea    pt states mild case and did not get cpap   UTI (urinary tract infection)    Vitamin D  deficiency     Past Surgical History:  Procedure Laterality Date   ANKLE ARTHROSCOPY Left 05/09/2020   Procedure: ANKLE ARTHROSCOPY OCD REPAIR LEFT;  Surgeon: Ashley Soulier, DPM;  Location: [facility];  Service: Podiatry;  Laterality: Left;   CESAREAN SECTION     x3 1995, 1996, 1998   CHOLECYSTECTOMY  1998   COLONOSCOPY WITH PROPOFOL  N/A 05/01/2019   Procedure: COLONOSCOPY WITH PROPOFOL ;  Surgeon: Jinny Carmine, MD;  Location: [facility];  Service: Endoscopy;  Laterality: N/A;   CYSTOSCOPY N/A 07/28/2023   Procedure: CYSTOSCOPY;  Surgeon: Verdon Keen, MD;  Location: [facility];  Service: Gynecology;  Laterality: N/A;   ESOPHAGOGASTRODUODENOSCOPY (EGD) WITH PROPOFOL  N/A 05/01/2019   Procedure: ESOPHAGOGASTRODUODENOSCOPY (EGD) WITH PROPOFOL ;  Surgeon: Jinny Carmine, MD;  Location: [facility];  Service: Endoscopy;  Laterality: N/A;   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 in [facility]    GRAFT APPLICATION Left  12/04/2021   Procedure: GRAFT APPLICATION - CALCANEAL AUTOGRAFT;  Surgeon: Ashley Soulier, DPM;  Location: [facility];  Service: Podiatry;  Laterality: Left;   NASAL SINUS SURGERY     2009/2010   ORIF CALCANEOUS FRACTURE Left 12/04/2021   Procedure: REPAIR NONUNION - CALCANEAL EVANS OSTEOTOMY SITE;  Surgeon: Ashley Soulier, DPM;  Location: [facility];  Service: Podiatry;  Laterality: Left;   OSTECTOMY Left 05/09/2020   Procedure: EVANS/MCDO LEFT;  Surgeon: Ashley Soulier, DPM;  Location: [facility];  Service: Podiatry;  Laterality: Left;   ROBOTIC ASSISTED TOTAL HYSTERECTOMY WITH BILATERAL SALPINGO OOPHERECTOMY N/A 07/28/2023   Procedure: XI ROBOTIC ASSISTED TOTAL HYSTERECTOMY WITH BILATERAL SALPINGO OOPHORECTOMY;  Surgeon: Verdon Keen, MD;  Location: [facility];  Service: Gynecology;  Laterality: N/A;   TENDON TRANSFER Left 05/09/2020   Procedure: FDL TRANSFER;DEEP LEFT;  Surgeon: Ashley Soulier, DPM;  Location: [facility];  Service: Podiatry;  Laterality: Left;   TUBAL LIGATION     uterine ablation  2010   WOUND  DEBRIDEMENT Left 05/09/2020   Procedure: A-SCOPE/DEBRIDEMENT/EXTENSIVE LEFT;  Surgeon: Ashley Soulier, DPM;  Location: [facility];  Service: Podiatry;  Laterality: Left;    Family Psychiatric History: I reviewed family psychiatric history from progress note on 09/14/2023.  Family History:  Family History  Problem Relation Age of Onset   Bipolar disorder Mother    Obesity Mother    Sleep apnea Mother    Thyroid  disease Mother    Hyperlipidemia Mother    Cancer Mother 71       lung cancer-small cell lung cancer    Heart disease Mother        MI with stents   Hypertension Mother    Diabetes Mother    COPD Mother    Hypertension Father    Hyperlipidemia Father    Cancer Maternal Aunt        breast cancer   Breast cancer Maternal Aunt    Alcohol abuse Maternal Aunt        Liver Failure   Cancer Maternal Uncle        colon cancer   Alcohol abuse Maternal Grandfather    Heart  disease Maternal Grandfather    Cancer Maternal Grandmother        brain cancer    Heart disease Paternal Grandfather    Hypertension Paternal Grandfather    Arthritis Paternal Grandmother    Cancer Paternal Grandmother        leukemia    Hypertension Paternal Grandmother    Breast cancer Paternal Grandmother    Autism spectrum disorder Daughter    Mental illness Son     Social History: I have reviewed social history from progress note on 09/14/2023. Social History   Socioeconomic History   Marital status: Married    Spouse name: Marinda   Number of children: 3   Years of education: 16   Highest education level: Not on file  Occupational History   Occupation: caseworker  Tobacco Use   Smoking status: Never   Smokeless tobacco: Never  Vaping Use   Vaping status: Never Used  Substance and Sexual Activity   Alcohol use: Yes    Alcohol/week: 4.0 standard drinks of alcohol    Types: 3 Glasses of wine, 1 Cans of beer per week    Comment: daily   Drug use: Not Currently    Types: Marijuana    Comment: 17 YEARS AGO   Sexual activity: Yes    Birth control/protection: None, Surgical    Comment: tubal ligation  Other Topics Concern   Not on file  Social History Narrative   Mahi Zabriskie grew up in [place], [place]. She attended [employer] and obtained her Bachelors in [employer]. She lives in [place] with her 3 children and her husband       Caffeine - 2 coffee, no sodas   Exercise - not currently   Bachelors degree    Caseworker    4 pregnancies, 3 live births    Feels safe in relationship    Wears seatbelt   No guns       Social Drivers of Corporate investment banker Strain: Not on file  Food Insecurity: Not on file  Transportation Needs: Not on file  Physical Activity: Not on file  Stress: Not on file  Social Connections: Not on file    Allergies:  Allergies  Allergen Reactions   Other Hives     Nut allergy . Walnuts and hazelnuts are the worst.  Penicillins Itching    Hives    Percocet [Oxycodone -Acetaminophen ] Hives    Hives    Ambien [Zolpidem]     Hallucinations falls   Lipitor [Atorvastatin ]     Unable to tolerate h/o fatty liver with elevated lfts    Molds & Smuts Other (See Comments)    Congestion and sneezing    Aspirin  Itching    hives   Latex Rash    Hives .     Metabolic Disorder Labs: Lab Results  Component Value Date   HGBA1C 5.3 01/11/2024   MPG 100 11/11/2008   No results found for: PROLACTIN Lab Results  Component Value Date   CHOL 225 (H) 01/11/2024   TRIG 112.0 01/11/2024   HDL 72.90 01/11/2024   CHOLHDL 3 01/11/2024   VLDL 22.4 01/11/2024   LDLCALC 129 (H) 01/11/2024   LDLCALC 69 10/13/2021   Lab Results  Component Value Date   TSH 3.96 01/11/2024   TSH 2.220 05/05/2021    Therapeutic Level Labs: No results found for: LITHIUM No results found for: VALPROATE No results found for: CBMZ  Current Medications: Current Outpatient Medications  Medication Sig Dispense Refill   cyanocobalamin  (VITAMIN B12) 1000 MCG tablet Take 1 tablet (1,000 mcg total) by mouth daily. 90 tablet 3   amLODipine  (NORVASC ) 5 MG tablet Take 1 tablet (5 mg total) by mouth every morning. 90 tablet 3   cetirizine (ZYRTEC) 10 MG tablet Take 10 mg by mouth as needed.     diphenhydrAMINE  (BENADRYL ) 25 MG tablet Take 25 mg by mouth daily as needed for allergies.      docusate sodium  (COLACE) 100 MG capsule Take 1 capsule (100 mg total) by mouth 2 (two) times daily. To keep stools soft 30 capsule 0   EPINEPHrine  0.3 mg/0.3 mL IJ SOAJ injection Inject 0.3 mg into the muscle as needed for anaphylaxis. 1 each 1   estradiol  (VIVELLE -DOT) 0.1 MG/24HR patch Place onto the skin 2 (two) times a week.     hydrOXYzine  (ATARAX ) 25 MG tablet Take 1 tablet (25 mg total) by mouth 3 (three) times daily as needed for anxiety. For severe anxiety attacks 90 tablet 1   ibuprofen  (ADVIL ) 800 MG tablet Take 1 tablet (800 mg total)  by mouth every 8 (eight) hours as needed. 30 tablet 0   olmesartan -hydrochlorothiazide  (BENICAR  HCT) 20-12.5 MG tablet Take 1 tablet by mouth every morning. 90 tablet 3   ondansetron  (ZOFRAN ) 4 MG tablet TAKE 1 TABLET BY MOUTH EVERY 8 HOURS AS NEEDED FOR NAUSEA FOR VOMITING 30 tablet 0   sucralfate  (CARAFATE ) 1 g tablet Take 1 tablet (1 g total) by mouth 4 (four) times daily -  with meals and at bedtime for 15 days. 60 tablet 0   venlafaxine  (EFFEXOR ) 100 MG tablet TAKE ONE-HALF TABLET BY MOUTH TWICE DAILY 30 tablet 2   No current facility-administered medications for this visit.     Musculoskeletal: Strength & Muscle Tone: UTA Gait & Station: Seated Patient leans: N/A  Psychiatric Specialty Exam: Review of Systems  Psychiatric/Behavioral:  Positive for sleep disturbance. The patient is nervous/anxious.     There were no vitals taken for this visit.There is no height or weight on file to calculate BMI.  General Appearance: Casual  Eye Contact:  Fair  Speech:  Clear and Coherent  Volume:  Normal  Mood:  Anxious  Affect:  Congruent  Thought Process:  Goal Directed and Descriptions of Associations: Intact  Orientation:  Full (Time, Place, and  Person)  Thought Content: Logical   Suicidal Thoughts:  No  Homicidal Thoughts:  No  Memory:  Immediate;   Fair Recent;   Fair Remote;   Fair  Judgement:  Fair  Insight:  Fair  Psychomotor Activity:  Normal  Concentration:  Concentration: Fair and Attention Span: Fair  Recall:  [employer] of Knowledge: Fair  Language: Fair  Akathisia:  No  Handed:  Right  AIMS (if indicated): not done  Assets:  Communication Skills Desire for Improvement Housing Social Support Transportation  ADL's:  Intact  Cognition: WNL  Sleep:  Poor   Screenings: AUDIT    Loss adjuster, chartered Office Visit from 09/14/2023 in [facility]  Alcohol Use Disorder Identification Test Final Score (AUDIT) 16   GAD-7     Flowsheet Row Office Visit from 01/11/2024 in [facility] [employer] Visit from 10/11/2023 in [facility] [employer] Visit from 09/14/2023 in [facility] Office Visit from 06/02/2023 in [facility] [employer] Visit from 01/27/2023 in [facility] [facility]  Total GAD-7 Score 15 19 17 3 4    PHQ2-9    Flowsheet Row Office Visit from 01/11/2024 in [facility] Visit from 10/11/2023 in [facility] [employer] Visit from 09/14/2023 in [facility] Office Visit from 06/02/2023 in [facility] Visit from 01/27/2023 in [facility] Total Score 2 4 5 3 2   PHQ-9 Total Score 11 17 17 12 9    Flowsheet Row ED from 06/14/2024 in [facility] Video Visit from 03/05/2024 in [facility] Video Visit from 01/20/2024 in [facility]  C-SSRS RISK CATEGORY No Risk No Risk No Risk     Assessment and Plan: Valerie Morales is a 48 year old African-American female with history of depression, anxiety, alcohol use disorder was evaluated by telemedicine today.  Discussed assessment and plan as noted below.  Alcohol use disorder-unstable Ongoing excessive alcoholism at least 4 times a week although using husband support system to limit the amount she uses on a day-to-day basis.  Aware of the effect of alcohol on her mood as well as sleep.  She is not noncompliant with Topamax .  She has been noncompliant with referral for alcohol treatment programs.  Agreeable with sending another referral to [facility] health. Referral was  previously sent and patient was not compliant. Provided education the patient.   MDD-unstable Continues to have mood symptoms however does report ongoing alcoholism which likely also contributing to the same.  No further medication changes until she gets help with her alcohol use and attend the program. Continue venlafaxine  in the meantime at the current dosage of 100 mg daily in divided dosage   Other specified anxiety disorder-limited symptom attacks-unstable Does have ongoing anxiety symptoms likely exacerbated by ongoing use of alcohol. Continue venlafaxine  100 mg daily Continue hydroxyzine  25 mg 3 times a day as needed  Insomnia-unstable Current ongoing sleep issues likely complicated by regular use of alcohol.  Provided education.  Encouraged abstinence. Discontinue trazodone  for side effects. Patient could use hydroxyzine  as needed at bedtime. Could also get melatonin combination drugs once she discussed with her primary care provider to rule out interaction with any  of her other medications. Encouraged sleep hygiene techniques  Follow-up Follow-up in clinic after completing alcohol treatment program for further medication management as needed.  Collaboration of Care: Collaboration of Care: Other patient referred for alcohol treatment program.  Patient/Guardian was advised Release of Information must be obtained prior to any record release in order to collaborate their care with an outside provider. Patient/Guardian was advised if they have not already done so to contact the registration department to sign all necessary forms in order for us  to release information regarding their care.   Consent: Patient/Guardian gives verbal consent for treatment and assignment of benefits for services provided during this visit. Patient/Guardian expressed understanding and agreed to proceed.   This note was generated in part or whole with voice recognition software. Voice recognition is usually  quite accurate but there are transcription errors that can and very often do occur. I apologize for any typographical errors that were not detected and corrected.    Blaize Epple, MD 07/02/2024, 5:05 PM

## 2024-07-02 NOTE — Patient Instructions (Addendum)
 [email or web address] [address] [place], [place], [place] [address]   [phone]  [email or web address] [address] [place], [place], [place] [address]   [phone]  [facility] ( Virtual program )  - [phone]

## 2024-07-03 ENCOUNTER — Telehealth (HOSPITAL_COMMUNITY): Payer: Self-pay | Admitting: Licensed Clinical Social Worker

## 2024-07-03 NOTE — Telephone Encounter (Signed)
 Note in error.

## 2024-07-03 NOTE — Telephone Encounter (Addendum)
 The therapist reaches out to [facility] via telephone at the request of Dr. Coby confirming her identity via two identifiers.  Valerie Morales states her belief that her drinking has never had an impact on her work.  She says that she did attempt to stop her drinking a year and there and a half ago when her weight became an issue and the doctor mentioned that calories and alcohol.  Since having her hysterectomy in October of last year, she says that her anxiety has skyrocketed.  She admits that when being honest that she is not sure if her fatty liver disease has been caused by alcohol, some of the medications that she is taking, or a combination of both.  When the therapist informs her that he does not see any medications on her current med list that would be likely to cause liver damage, she says that she often takes Tylenol  for her headaches.  Presently, she says that her husband will limit her to 3 glasses of wine during the weekdays and at no more than 6 glasses of wine on a weekend.  She does admit that her drinking can get worse during certain events.  When the therapist inquires as to whether any other doctor doctors besides her psychiatrist has talked to her about stopping drinking, she says that her primary care doctor has also mentioned this to her.  Although she apparently has some ambivalence about stopping drinking and spite of the impact it can have on several of her health conditions, she does say that she would like to schedule a therapy session with this therapist.  As she told Dr. Eappen that she had trouble finding meetings that did not conflict with her work schedule, the therapist makes her aware of the everything AA app and the meeting guide app so she can find virtual meetings 24/7,  She agrees to try out some of these meetings between now and when she is scheduled to see this therapist in 1 week.  Zell Maier, MA, LCSW, [facility], LCAS 07/03/2024

## 2024-07-10 ENCOUNTER — Ambulatory Visit (INDEPENDENT_AMBULATORY_CARE_PROVIDER_SITE_OTHER): Admitting: Licensed Clinical Social Worker

## 2024-07-10 ENCOUNTER — Encounter (HOSPITAL_COMMUNITY): Payer: Self-pay

## 2024-07-10 DIAGNOSIS — F330 Major depressive disorder, recurrent, mild: Secondary | ICD-10-CM

## 2024-07-10 DIAGNOSIS — F1020 Alcohol dependence, uncomplicated: Secondary | ICD-10-CM | POA: Diagnosis not present

## 2024-07-10 DIAGNOSIS — G4701 Insomnia due to medical condition: Secondary | ICD-10-CM

## 2024-07-10 DIAGNOSIS — F418 Other specified anxiety disorders: Secondary | ICD-10-CM

## 2024-07-10 NOTE — Progress Notes (Unsigned)
 THERAPIST PROGRESS NOTE  Session Time: 3:02 p.m. to 4:04 p.m.  Virtual Visit via Video Note   I connected with Valerie Morales at 3:02 p.m. EST by a video enabled telemedicine application and verified that I am speaking with the correct person using two identifiers.   Location: Patient: home Provider: [facility] office   I discussed the limitations of evaluation and management by telemedicine and the availability of in person appointments. The patient expressed understanding and agreed to proceed.  Type of Therapy: Individual   Therapist Response/Interventions: Motivational Interviewing/The therapist educates her about how alcohol use disorders are diagnosed while having her answer questions about her drinking such that she can gain a better understanding of how it is negatively impacting her health and help her to gain a better understanding of the reason that she needs to stop drinking. The therapist educates her about addiction as a brain-based disease and the fact that four or more standard drinks is one sitting is considered a binge episode for a female.   Treatment Goals addressed:  Active     Substance Use      Valerie Morales will reduce her alcohol consumption from 3 drinks of alcohol during the week and 6 drinks on the weekend while learning to cope with stress without using alcohol and will explore sober support options.  (Initial)     Start:  07/10/24         The therapist will assist Valerie Morales in being able to identify and avoid triggers for drinking while learning healthier coping skills to deal with stress and sleep problems.     Start:  07/10/24            Summary: Valerie Morales presents for her initial meeting with this therapist admitting that she did not attend any virtual AA meetings as she indicated during their last telephone conversation.  She says that she has not really had the chance as she likes to decompress after she gets home from work adding that things have been  really hectic lately at work.  She then goes on to say that this is probably not a good excuse.  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  She says that one of the reasons that she drinks is that a will help to address her sleep problems.  She says that Dr.Eappen changed the dosage of her Effexor  and put her on Topamax  but she is no longer taking the Topamax  as it made her too drowsy and groggy the next day.  She has untreated sleep apnea as she has declined to wear a CPAP due to to the position in which she sleeps.  She does say that she has a prescription for trazodone  from another medical provider of which her psychiatrist is apparently unaware.  She says that she seldom if ever takes it as when she does she will not sleep through the night but rather will wake up in the middle of the night.  She believes that she is only taking 50 mg.  The therapist talks with Valerie Morales about her goals for therapy.  She says that she does not want to stop drinking completely but would like to eventually get down to a set level.  When the therapist asks her what this level would look like, she says that she would perhaps get to a point where she would not drink on weekdays but would have 5  glasses of wine on a Friday and maybe 3 glasses of wine on a Saturday.  When asked about family history for addiction, she says that she has an aunt who had a substance use problem and that both grandfathers had issues with addiction.  She also says that she has an uncle who smokes marijuana every day.  When the therapist goes over the diagnostic criteria for a substance use disorder, she endorses having at least 5 of the symptoms in relation to her alcohol.  When she takes the Alcoholics Anonymous questionnaire, she answers yes to 4 of the questions.  The therapist educates her on the diagnostic criteria for a substance use disorder, what  normative drinking looks like, and about the reasons that addiction is a genetically inheritable potentially progressive brain base disease as opposed to a character defect or moral weakness.  When asked about her mood, she says that her mood has been okay for the most part but admits that she is often irritated at work and stressed out which she says is triggers to drink.  She does admit that she needs to learn ways to cope with stress as opposed to drinking.  By the conclusion of the session, she says that she will review the material on the health risks of untreated sleep apnea being more open to the possibility of wearing a CPAP.  Additionally, she will talk to her psychiatrist about the trazodone  and perhaps taking a higher dose.  Lastly, she does agree to trying some virtual AA meetings with the therapist suggesting that she could perhaps attend them in the morning rather than after work when she is too stressed to do so.  He points out the fact that she could ask people who are in long-term recovery about the ways they learn to deal with stress without having to drink.     Progress Towards Goals: Initial  Suicidal/Homicidal: No SI or HI  Plan: Valerie Morales wishes to return in 4 weeks.  Diagnosis: Alcohol Use Disorder, Severe;   Collaboration of Care: Other N/A  Patient/Guardian was advised Release of Information must be obtained prior to any record release in order to collaborate their care with an outside provider. Patient/Guardian was advised if they have not already done so to contact the registration department to sign all necessary forms in order for us  to release information regarding their care.   Consent: Patient/Guardian gives verbal consent for treatment and assignment of benefits for services provided during this visit. Patient/Guardian expressed understanding and agreed to proceed.   Zell Maier, MA, LCSW, [facility], LCAS 07/10/2024

## 2024-07-13 ENCOUNTER — Ambulatory Visit: Payer: Self-pay | Admitting: *Deleted

## 2024-07-13 ENCOUNTER — Ambulatory Visit: Payer: Self-pay | Admitting: Nurse Practitioner

## 2024-07-13 ENCOUNTER — Telehealth: Payer: Self-pay | Admitting: Nurse Practitioner

## 2024-07-13 NOTE — Telephone Encounter (Signed)
 Appt canceled today due to sick provider. Rescheduled appt for 07/20/24 with other provider. Recommended if sx worsen call back     FYI Only or Action Required?: FYI only for provider.  Patient was last seen in primary care on 05/09/2024 by Vivienne Delon HERO, PA-C.  Called Nurse Triage reporting Fall.  Symptoms began a week ago.  Interventions attempted: Rest, hydration, or home remedies.  Symptoms are: stable.  Triage Disposition: Home Care  Patient/caregiver understands and will follow disposition?: Yes            Copied from CRM #[id number]. Topic: Clinical - Red Word Triage >> Jul 13, 2024  9:34 AM Berneda FALCON wrote: Red Word that prompted transfer to Nurse Triage: Patient was unaware that her appt today was canceled. States she wanted to reschedule but had some additional things happen since then. Last week she was vomiting foamy substance, but not this week. Last week she had 2 falls, did not go to the ED. Once was due to dog tripping her and the other was stepping wrong in the bathtub. Reason for Disposition  [1] Recent fall AND [2] no injury  Answer Assessment - Initial Assessment Questions Appt canceled and patient calling back to reschedule. Rescheduled appt for 07/20/24.   During triage call disconnected and NT called patient back.    1. MECHANISM: How did the fall happen?    Tripped over dog and fell down stairs on back and right side. Denies hitting head. Reports no injury. Clemens again last Thursday in bathtub steeping with left foot denies hitting head.  2. DOMESTIC VIOLENCE AND ELDER ABUSE SCREENING: Did you fall because someone pushed you or tried to hurt you? If Yes, ask: Are you safe now?      na 3. ONSET: When did the fall happen? (e.g., minutes, hours, or days ago)     Last Thursday  fell in tub . Last Tuesday 1 week ago fell over dog down stairs.  4. LOCATION: What part of the body hit the ground? (e.g., back, buttocks, head, hips, knees,  hands, head, stomach)     Right side and back and left foot  5. INJURY: Did you hurt (injure) yourself when you fell? If Yes, ask: What did you injure? Tell me more about this? (e.g., body area; type of injury; pain severity)     Denies  6. PAIN: Is there any pain? If Yes, ask: How bad is the pain? (e.g., Scale 0-10; or none, mild,      na 7. SIZE: For cuts, bruises, or swelling, ask: How large is it? (e.g., inches or centimeters)      na 8. PREGNANCY: Is there any chance you are pregnant? When was your last menstrual period?     na 9. OTHER SYMPTOMS: Do you have any other symptoms? (e.g., dizziness, fever, weakness; new-onset or worsening).      Vomiting on and off foamy emesis. No abdominal pain no dizziness no fever. Able to eat and drink.  10. CAUSE: What do you think caused the fall (or falling)? (e.g., dizzy spell, tripped)       tripped  Protocols used: Falls and [facility]

## 2024-07-13 NOTE — Telephone Encounter (Signed)
 Lm to call and reschedule appt/provider sick.MyChart message sent.  E2C2 please reschedule appt

## 2024-07-20 ENCOUNTER — Encounter: Payer: Self-pay | Admitting: Nurse Practitioner

## 2024-07-20 ENCOUNTER — Ambulatory Visit: Admitting: Nurse Practitioner

## 2024-07-20 VITALS — BP 126/82 | HR 95 | Temp 98.2°F | Ht 63.0 in | Wt 209.0 lb

## 2024-07-20 DIAGNOSIS — R748 Abnormal levels of other serum enzymes: Secondary | ICD-10-CM | POA: Diagnosis not present

## 2024-07-20 DIAGNOSIS — W19XXXA Unspecified fall, initial encounter: Secondary | ICD-10-CM

## 2024-07-20 DIAGNOSIS — R112 Nausea with vomiting, unspecified: Secondary | ICD-10-CM | POA: Diagnosis not present

## 2024-07-20 LAB — HEPATIC FUNCTION PANEL
ALT: 33 U/L (ref 0–35)
AST: 82 U/L — ABNORMAL HIGH (ref 0–37)
Albumin: 4.2 g/dL (ref 3.5–5.2)
Alkaline Phosphatase: 141 U/L — ABNORMAL HIGH (ref 39–117)
Bilirubin, Direct: 0.2 mg/dL (ref 0.0–0.3)
Total Bilirubin: 0.6 mg/dL (ref 0.2–1.2)
Total Protein: 7 g/dL (ref 6.0–8.3)

## 2024-07-20 LAB — BASIC METABOLIC PANEL WITH GFR
BUN: 8 mg/dL (ref 6–23)
CO2: 29 meq/L (ref 19–32)
Calcium: 9.3 mg/dL (ref 8.4–10.5)
Chloride: 96 meq/L (ref 96–112)
Creatinine, Ser: 0.7 mg/dL (ref 0.40–1.20)
GFR: 101.77 mL/min (ref >60.00)
Glucose, Bld: 91 mg/dL (ref 70–99)
Potassium: 4.1 meq/L (ref 3.5–5.1)
Sodium: 134 meq/L — ABNORMAL LOW (ref 135–145)

## 2024-07-20 NOTE — Progress Notes (Signed)
 Established Patient Office Visit  Subjective:  Patient ID: Valerie Morales, female    DOB: 1975/12/01  Age: 48 y.o. MRN: [id number]  CC:  Chief Complaint  Patient presents with   Acute Visit    2 falls and vomiting white foam x 2 weeks   Discussed the use of a AI scribe software for clinical note transcription with the patient, who gave verbal consent to proceed.  HPI  Valerie Morales is a 48 year old female who presents with recent falls and intermittent vomiting.  She has experienced two falls in the past two weeks. The first fall resulted in a scraped foot, and the second fall caused shoulder discomfort without significant movement limitations. No head injuries occurred during these falls.  Intermittent vomiting has been present for a few months, typically one or two days a month, sometimes following alcohol consumption. She feels very hot before vomiting, which resolves with cooling. Zofran  effectively manages her nausea. The last episode was two to three weeks ago, during the week of her falls.  She visited the emergency room a few weeks ago for upper abdominal pain.  She was referred to see GI have not seen GI patient.  Patient would like to get a referral sent to GI.  She denies current abdominal pain.  She experiences intermittent constipation, with bowel movements every other day, managed by dietary adjustments. She has reduced alcohol intake and uses trazodone  for sleep disturbances, which is effective. Topamax  causes daytime sleepiness, so she takes it at night. No fever, current abdominal pain, or acid reflux. No recent vomiting episodes in the last two weeks except for one instance. No nausea at the time of the visit  HPI   Past Medical History:  Diagnosis Date   Anemia    has required 2 units PRBC   Anxiety    Arthritis    B12 deficiency    Back pain    Bipolar depression (HCC)    Chest pain    Chicken pox    Depression    trazadone has not  helped in the past    Fatty liver    H/O alcohol abuse    H/O gastric bypass    Headache    Hepatomegaly    High cholesterol    History of blood transfusion 2009   [place]   Hypertension    Morbid obesity (HCC)    MRSA (methicillin resistant Staphylococcus aureus)    Nausea and vomiting 07/23/2024   Ovarian cyst    Pelvic pain    Scoliosis of thoracic spine    Shortness of breath on exertion    Sinus tachycardia    Sinusitis    Sinusitis, chronic    Sleep apnea    pt states mild case and did not get cpap   UTI (urinary tract infection)    Vitamin D  deficiency     Past Surgical History:  Procedure Laterality Date   ANKLE ARTHROSCOPY Left 05/09/2020   Procedure: ANKLE ARTHROSCOPY OCD REPAIR LEFT;  Surgeon: Ashley Soulier, DPM;  Location: [facility];  Service: Podiatry;  Laterality: Left;   CESAREAN SECTION     x3 1995, 1996, 1998   CHOLECYSTECTOMY  1998   COLONOSCOPY WITH PROPOFOL  N/A 05/01/2019   Procedure: COLONOSCOPY WITH PROPOFOL ;  Surgeon: Jinny Carmine, MD;  Location: [facility];  Service: Endoscopy;  Laterality: N/A;   CYSTOSCOPY N/A 07/28/2023   Procedure: CYSTOSCOPY;  Surgeon: Verdon Keen, MD;  Location: [facility];  Service:  Gynecology;  Laterality: N/A;   ESOPHAGOGASTRODUODENOSCOPY (EGD) WITH PROPOFOL  N/A 05/01/2019   Procedure: ESOPHAGOGASTRODUODENOSCOPY (EGD) WITH PROPOFOL ;  Surgeon: Jinny Carmine, MD;  Location: [facility];  Service: Endoscopy;  Laterality: N/A;   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 in [facility]    GRAFT APPLICATION Left 12/04/2021   Procedure: GRAFT APPLICATION - CALCANEAL AUTOGRAFT;  Surgeon: Ashley Soulier, DPM;  Location: [facility];  Service: Podiatry;  Laterality: Left;   NASAL SINUS SURGERY     2009/2010   ORIF CALCANEOUS FRACTURE Left 12/04/2021   Procedure: REPAIR NONUNION - CALCANEAL EVANS OSTEOTOMY SITE;  Surgeon: Ashley Soulier, DPM;  Location: [facility];  Service: Podiatry;  Laterality: Left;   OSTECTOMY Left  05/09/2020   Procedure: EVANS/MCDO LEFT;  Surgeon: Ashley Soulier, DPM;  Location: [facility];  Service: Podiatry;  Laterality: Left;   ROBOTIC ASSISTED TOTAL HYSTERECTOMY WITH BILATERAL SALPINGO OOPHERECTOMY N/A 07/28/2023   Procedure: XI ROBOTIC ASSISTED TOTAL HYSTERECTOMY WITH BILATERAL SALPINGO OOPHORECTOMY;  Surgeon: Verdon Keen, MD;  Location: [facility];  Service: Gynecology;  Laterality: N/A;   TENDON TRANSFER Left 05/09/2020   Procedure: FDL TRANSFER;DEEP LEFT;  Surgeon: Ashley Soulier, DPM;  Location: [facility];  Service: Podiatry;  Laterality: Left;   TUBAL LIGATION     uterine ablation  2010   WOUND DEBRIDEMENT Left 05/09/2020   Procedure: A-SCOPE/DEBRIDEMENT/EXTENSIVE LEFT;  Surgeon: Ashley Soulier, DPM;  Location: [facility];  Service: Podiatry;  Laterality: Left;    Family History  Problem Relation Age of Onset   Bipolar disorder Mother    Obesity Mother    Sleep apnea Mother    Thyroid  disease Mother    Hyperlipidemia Mother    Cancer Mother 29       lung cancer-small cell lung cancer    Heart disease Mother        MI with stents   Hypertension Mother    Diabetes Mother    COPD Mother    Hypertension Father    Hyperlipidemia Father    Cancer Maternal Aunt        breast cancer   Breast cancer Maternal Aunt    Alcohol abuse Maternal Aunt        Liver Failure   Cancer Maternal Uncle        colon cancer   Alcohol abuse Maternal Grandfather    Heart disease Maternal Grandfather    Cancer Maternal Grandmother        brain cancer    Heart disease Paternal Grandfather    Hypertension Paternal Grandfather    Arthritis Paternal Grandmother    Cancer Paternal Grandmother        leukemia    Hypertension Paternal Grandmother    Breast cancer Paternal Grandmother    Autism spectrum disorder Daughter    Mental illness Son     Social History   Socioeconomic History   Marital status: Married    Spouse name: Marinda   Number of children: 3   Years of education: 16    Highest education level: Not on file  Occupational History   Occupation: caseworker  Tobacco Use   Smoking status: Never   Smokeless tobacco: Never  Vaping Use   Vaping status: Never Used  Substance and Sexual Activity   Alcohol use: Yes    Alcohol/week: 4.0 standard drinks of alcohol    Types: 3 Glasses of wine, 1 Cans of beer per week    Comment: daily   Drug use: Not Currently  Types: Marijuana    Comment: 17 YEARS AGO   Sexual activity: Yes    Birth control/protection: None, Surgical    Comment: tubal ligation  Other Topics Concern   Not on file  Social History Narrative   Lashonta Pilling grew up in [place], [place]. She attended [employer] and obtained her Bachelors in [employer]. She lives in [place] with her 3 children and her husband       Caffeine - 2 coffee, no sodas   Exercise - not currently   Bachelors degree    Caseworker    4 pregnancies, 3 live births    Feels safe in relationship    Wears seatbelt   No guns       Social Drivers of Corporate investment banker Strain: Not on file  Food Insecurity: Not on file  Transportation Needs: Not on file  Physical Activity: Not on file  Stress: Not on file  Social Connections: Not on file  Intimate Partner Violence: Not on file     Outpatient Medications Prior to Visit  Medication Sig Dispense Refill   amLODipine  (NORVASC ) 5 MG tablet Take 1 tablet (5 mg total) by mouth every morning. 90 tablet 3   cetirizine (ZYRTEC) 10 MG tablet Take 10 mg by mouth as needed.     cyanocobalamin  (VITAMIN B12) 1000 MCG tablet Take 1 tablet (1,000 mcg total) by mouth daily. 90 tablet 3   diphenhydrAMINE  (BENADRYL ) 25 MG tablet Take 25 mg by mouth daily as needed for allergies.      docusate sodium  (COLACE) 100 MG capsule Take 1 capsule (100 mg total) by mouth 2 (two) times daily. To keep stools soft 30 capsule 0   EPINEPHrine  0.3 mg/0.3 mL IJ SOAJ injection Inject 0.3 mg into the muscle as needed for anaphylaxis. 1 each  1   estradiol  (VIVELLE -DOT) 0.1 MG/24HR patch Place onto the skin 2 (two) times a week.     hydrOXYzine  (ATARAX ) 25 MG tablet Take 1 tablet (25 mg total) by mouth 3 (three) times daily as needed for anxiety. For severe anxiety attacks 90 tablet 1   ibuprofen  (ADVIL ) 800 MG tablet Take 1 tablet (800 mg total) by mouth every 8 (eight) hours as needed. 30 tablet 0   olmesartan -hydrochlorothiazide  (BENICAR  HCT) 20-12.5 MG tablet Take 1 tablet by mouth every morning. 90 tablet 3   ondansetron  (ZOFRAN ) 4 MG tablet TAKE 1 TABLET BY MOUTH EVERY 8 HOURS AS NEEDED FOR NAUSEA FOR VOMITING 30 tablet 0   sucralfate  (CARAFATE ) 1 g tablet Take 1 tablet (1 g total) by mouth 4 (four) times daily -  with meals and at bedtime for 15 days. 60 tablet 0   topiramate  (TOPAMAX ) 25 MG tablet Take 1 tablet by mouth daily.     venlafaxine  (EFFEXOR ) 100 MG tablet TAKE ONE-HALF TABLET BY MOUTH TWICE DAILY 30 tablet 2   No facility-administered medications prior to visit.    Allergies  Allergen Reactions   Other Hives     Nut allergy . Walnuts and hazelnuts are the worst.    Penicillins Itching    Hives    Percocet [Oxycodone -Acetaminophen ] Hives    Hives    Ambien [Zolpidem]     Hallucinations falls   Lipitor [Atorvastatin ]     Unable to tolerate h/o fatty liver with elevated lfts    Molds & Smuts Other (See Comments)    Congestion and sneezing    Aspirin  Itching    hives   Latex Rash  Hives .     ROS Review of Systems Negative unless indicated in HPI.    Objective:    Physical Exam Constitutional:      Appearance: Normal appearance.  Cardiovascular:     Rate and Rhythm: Normal rate and regular rhythm.     Pulses: Normal pulses.     Heart sounds: Normal heart sounds.  Abdominal:     General: Bowel sounds are normal.     Palpations: Abdomen is soft.     Tenderness: There is no abdominal tenderness. There is no right CVA tenderness or left CVA tenderness.  Musculoskeletal:     Cervical back:  Normal range of motion.  Neurological:     General: No focal deficit present.     Mental Status: She is alert. Mental status is at baseline.  Psychiatric:        Mood and Affect: Mood normal.        Behavior: Behavior normal.        Thought Content: Thought content normal.        Judgment: Judgment normal.     BP 126/82   Pulse 95   Temp 98.2 F (36.8 C)   Ht 5' 3 (1.6 m)   Wt 209 lb (94.8 kg)   SpO2 97%   BMI 37.02 kg/m  Wt Readings from Last 3 Encounters:  07/20/24 209 lb (94.8 kg)  06/14/24 213 lb (96.6 kg)  01/11/24 210 lb 12.8 oz (95.6 kg)     Health Maintenance  Topic Date Due   HIV Screening  Never done   Hepatitis C Screening  Never done   Hepatitis B Vaccines 19-59 Average Risk (1 of 3 - 19+ 3-dose series) Never done   Mammogram  04/30/2023   COVID-19 Vaccine (3 - Moderna risk series) 08/03/2024 (Originally 11/21/2020)   Influenza Vaccine  01/15/2025 (Originally 05/18/2024)   Colonoscopy  04/30/2029   DTaP/Tdap/Td (3 - Td or Tdap) 02/15/2033   Pneumococcal Vaccine  Aged Out   HPV VACCINES  Aged Out   Meningococcal B Vaccine  Aged Out       Topic Date Due   Hepatitis B Vaccines 19-59 Average Risk (1 of 3 - 19+ 3-dose series) Never done    Lab Results  Component Value Date   TSH 3.96 01/11/2024   Lab Results  Component Value Date   WBC 7.4 06/14/2024   HGB 14.4 06/14/2024   HCT 41.2 06/14/2024   MCV 99.0 06/14/2024   PLT 240 06/14/2024   Lab Results  Component Value Date   NA 134 (L) 07/20/2024   K 4.1 07/20/2024   CO2 29 07/20/2024   GLUCOSE 91 07/20/2024   BUN 8 07/20/2024   CREATININE 0.70 07/20/2024   BILITOT 0.6 07/20/2024   ALKPHOS 141 (H) 07/20/2024   AST 82 (H) 07/20/2024   ALT 33 07/20/2024   PROT 7.0 07/20/2024   ALBUMIN 4.2 07/20/2024   CALCIUM  9.3 07/20/2024   ANIONGAP 18 (H) 06/14/2024   GFR 101.77 07/20/2024   Lab Results  Component Value Date   CHOL 225 (H) 01/11/2024   Lab Results  Component Value Date   HDL 72.90  01/11/2024   Lab Results  Component Value Date   LDLCALC 129 (H) 01/11/2024   Lab Results  Component Value Date   TRIG 112.0 01/11/2024   Lab Results  Component Value Date   CHOLHDL 3 01/11/2024   Lab Results  Component Value Date   HGBA1C 5.3 01/11/2024  Assessment & Plan:  Elevated liver enzymes Assessment & Plan: Patient has cut back on drinking. Previous liver enzyme elevation noted. - Check liver enzymes today.   Orders: -     Basic metabolic panel with GFR -     Hepatic function panel -     Ambulatory referral to Gastroenterology  Nausea and vomiting, unspecified vomiting type Assessment & Plan: Vomiting sometimes linked to alcohol. - Send referral to gastroenterologist for further evaluation. - Advise keeping a food diary to identify potential triggers for vomiting.  Orders: -     Ambulatory referral to Gastroenterology  Fall, initial encounter Assessment & Plan: Two falls without significant injury. No head injury reported.     Follow-up: Return if symptoms worsen or fail to improve.   Sera Hitsman, NP

## 2024-07-23 ENCOUNTER — Encounter: Payer: Self-pay | Admitting: Nurse Practitioner

## 2024-07-23 ENCOUNTER — Ambulatory Visit: Payer: Self-pay | Admitting: Nurse Practitioner

## 2024-07-23 DIAGNOSIS — W19XXXA Unspecified fall, initial encounter: Secondary | ICD-10-CM | POA: Insufficient documentation

## 2024-07-23 DIAGNOSIS — R112 Nausea with vomiting, unspecified: Secondary | ICD-10-CM | POA: Insufficient documentation

## 2024-07-23 NOTE — Assessment & Plan Note (Signed)
 Two falls without significant injury. No head injury reported.

## 2024-07-23 NOTE — Progress Notes (Signed)
 Liver function still elevated.  Advised patient on lifestyle intervention to reduce alcohol use, healthy diet and exercise.  Referral sent to GI.

## 2024-07-23 NOTE — Assessment & Plan Note (Signed)
 Patient has cut back on drinking. Previous liver enzyme elevation noted. - Check liver enzymes today.

## 2024-07-23 NOTE — Assessment & Plan Note (Signed)
 Vomiting sometimes linked to alcohol. - Send referral to gastroenterologist for further evaluation. - Advise keeping a food diary to identify potential triggers for vomiting.

## 2024-07-25 ENCOUNTER — Encounter: Payer: Self-pay | Admitting: Nurse Practitioner

## 2024-08-09 ENCOUNTER — Other Ambulatory Visit: Payer: Self-pay | Admitting: Medical Genetics

## 2024-08-09 ENCOUNTER — Ambulatory Visit (HOSPITAL_COMMUNITY): Admitting: Licensed Clinical Social Worker

## 2024-08-09 DIAGNOSIS — F32A Depression, unspecified: Secondary | ICD-10-CM

## 2024-08-09 DIAGNOSIS — F1020 Alcohol dependence, uncomplicated: Secondary | ICD-10-CM | POA: Diagnosis not present

## 2024-08-09 DIAGNOSIS — Z006 Encounter for examination for normal comparison and control in clinical research program: Secondary | ICD-10-CM

## 2024-08-09 NOTE — Progress Notes (Unsigned)
 THERAPIST PROGRESS NOTE  Session Time: 3:06 p.m. to 4:04 p.m.   Virtual Visit via Video Note   I connected with Michelle at 3:06 p.m. EST by a video enabled telemedicine application and verified that I am speaking with the correct person using two identifiers.   Location: Patient: home Provider: [facility] office   I discussed the limitations of evaluation and management by telemedicine and the availability of in person appointments. The patient expressed understanding and agreed to proceed.  Type of Therapy: Individual   Therapist Response/Interventions: Solution focused/the therapist informs Rosaline that she does not have to say anything when she starts attending AA meetings as newcomer's are not expected to talk.  Additionally, the the The therapist tells her that she could attend virtual meetings and keep her camera and microphone off and just listen so as to get acquainted with the program.  The therapist educates her about inpatient detox when she mentions that she may need to pursue this course of action and additionally he talks to her about attending something like [facility] as a stepdown.  The therapist informs her that if she were to pursue this course of action that her providers could complete paperwork that would allow her to take a leave of absence.  The therapist supports her decision about possibly starting to apply for other jobs as to escape the stressful environment.  Treatment Goals addressed:  Active     Substance Use      Rosaline will reduce her alcohol consumption from 3 drinks of alcohol during the week and 6 drinks on the weekend while learning to cope with stress without using alcohol and will explore sober support options.  (Not Progressing)     Start:  07/10/24         The therapist will assist Rosaline in being able to identify and avoid triggers for drinking while learning healthier coping skills to deal with stress and sleep problems.     Start:   07/10/24               Summary: Rosaline presents today saying that she has cut down her drinking to 8 glasses of alcohol this week which is a 33% reduction.  She has not attended an AA meeting and becomes tearful in the admitting that she is scared of being vulnerable in front of people.  She is afraid that if she had to tell her story that she would break down crying.  She admits that she drinks because there are feelings that she wants to suppress.  Rosaline says that she is unable to leave her work at home and that she needs to find some sort of worklife balance.  She says that she and her coworkers are constantly under some kind of threat about meeting their metrics while they are pleas to be able to work overtime [employer].  She talks about a man who she knows who attends AA and has made a great deal of progress saying that he is encouraged her to start attending.   Progress Towards Goals: Not progressing  Suicidal/Homicidal: No SI or HI  Plan: Rosaline wishes to return in 4 weeks.  She is informed that she can schedule with this therapist prior to her appointment in 4 weeks if she so chooses.  Diagnosis: Alcohol Use Disorder, Severe;   Collaboration of Care: Other N/A  Patient/Guardian was advised Release of Information must be obtained prior to any record release in order to collaborate their  care with an outside provider. Patient/Guardian was advised if they have not already done so to contact the registration department to sign all necessary forms in order for us  to release information regarding their care.   Consent: Patient/Guardian gives verbal consent for treatment and assignment of benefits for services provided during this visit. Patient/Guardian expressed understanding and agreed to proceed.   Zell Maier, MA, LCSW, [facility], LCAS 08/10/2024

## 2024-08-21 ENCOUNTER — Emergency Department
Admission: EM | Admit: 2024-08-21 | Discharge: 2024-08-21 | Discharge status: Against Medical Advice | Attending: Emergency Medicine | Admitting: Emergency Medicine

## 2024-08-21 ENCOUNTER — Other Ambulatory Visit: Payer: Self-pay

## 2024-08-21 ENCOUNTER — Ambulatory Visit
Admission: EM | Admit: 2024-08-21 | Discharge: 2024-08-21 | Attending: Emergency Medicine | Admitting: Emergency Medicine

## 2024-08-21 ENCOUNTER — Encounter: Payer: Self-pay | Admitting: *Deleted

## 2024-08-21 DIAGNOSIS — R1011 Right upper quadrant pain: Secondary | ICD-10-CM

## 2024-08-21 DIAGNOSIS — Z5321 Procedure and treatment not carried out due to patient leaving prior to being seen by health care provider: Secondary | ICD-10-CM | POA: Insufficient documentation

## 2024-08-21 LAB — COMPREHENSIVE METABOLIC PANEL WITH GFR
ALT: 53 U/L — ABNORMAL HIGH (ref 0–44)
AST: 165 U/L — ABNORMAL HIGH (ref 15–41)
Albumin: 4.1 g/dL (ref 3.5–5.0)
Alkaline Phosphatase: 182 U/L — ABNORMAL HIGH (ref 38–126)
Anion gap: 15 (ref 5–15)
BUN: 6 mg/dL (ref 6–20)
CO2: 24 mmol/L (ref 22–32)
Calcium: 9.2 mg/dL (ref 8.9–10.3)
Chloride: 92 mmol/L — ABNORMAL LOW (ref 98–111)
Creatinine, Ser: 0.54 mg/dL (ref 0.44–1.00)
GFR, Estimated: >60 mL/min (ref >60)
Glucose, Bld: 100 mg/dL — ABNORMAL HIGH (ref 70–99)
Potassium: 5.1 mmol/L (ref 3.5–5.1)
Sodium: 131 mmol/L — ABNORMAL LOW (ref 135–145)
Total Bilirubin: 1 mg/dL (ref 0.0–1.2)
Total Protein: 8.2 g/dL — ABNORMAL HIGH (ref 6.5–8.1)

## 2024-08-21 LAB — CBC
HCT: 39 % (ref 36.0–46.0)
Hemoglobin: 13.8 g/dL (ref 12.0–15.0)
MCH: 34.2 pg — ABNORMAL HIGH (ref 26.0–34.0)
MCHC: 35.4 g/dL (ref 30.0–36.0)
MCV: 96.8 fL (ref 80.0–100.0)
Platelets: 166 K/uL (ref 150–400)
RBC: 4.03 MIL/uL (ref 3.87–5.11)
RDW: 13.6 % (ref 11.5–15.5)
WBC: 5.8 K/uL (ref 4.0–10.5)
nRBC: 0 % (ref 0.0–0.2)

## 2024-08-21 LAB — URINALYSIS, ROUTINE W REFLEX MICROSCOPIC
Bilirubin Urine: NEGATIVE
Glucose, UA: NEGATIVE mg/dL
Hgb urine dipstick: NEGATIVE
Ketones, ur: NEGATIVE mg/dL
Leukocytes,Ua: NEGATIVE
Nitrite: NEGATIVE
Protein, ur: NEGATIVE mg/dL
Specific Gravity, Urine: 1.015 (ref 1.005–1.030)
pH: 5 (ref 5.0–8.0)

## 2024-08-21 LAB — LIPASE, BLOOD: Lipase: 47 U/L (ref 11–51)

## 2024-08-21 MED ORDER — KETOROLAC TROMETHAMINE 30 MG/ML IJ SOLN
30.0000 mg | Freq: Once | INTRAMUSCULAR | Status: AC
  Administered 2024-08-21: 30 mg via INTRAMUSCULAR

## 2024-08-21 NOTE — ED Notes (Signed)
 Patient to first nurse desk asking about wait time. This RN explained that we are unable to give out wait times. PT and visitor stating we will just go to [facility]. This RN encouraged patient to stay but pt declined. NAD noted. Ambulatory out of ED with steady gait.

## 2024-08-21 NOTE — Discharge Instructions (Signed)
 I have given you 30 mg of Toradol  here.  Nothing to eat or drink until your ER evaluation is complete.  I am sending you to the ER for a comprehensive evaluation including labs, pain control and possible imaging.  Let them know if your pain changes or gets worse

## 2024-08-21 NOTE — ED Triage Notes (Signed)
 Pt ambulatory to triage.  Pt has right side upper abd pain radiating into the mid back area.   No known injury to back  denies urinary sx.  Sx began 2-3 days.  Pt was seen at urgent care today, sent to er for eval.  Pt alert  speech clear.

## 2024-08-21 NOTE — ED Provider Notes (Signed)
 HPI  SUBJECTIVE:  Valerie Morales is a 48 y.o. female who presents with 3 to 4 days of right upper quadrant pain described as throbbing with radiation to her back.  She states it became constant and much worse last night after drinking approximately 750 cc of wine accompanied with a 25 ounce beer.  This is a normal amount of alcohol for her.  She reports 1 episode of nonbilious, nonbloody emesis.  No nausea, fevers, abdominal distention.  No coughing, wheezing, chest pain, shortness of breath.  No urinary complaints.  She had a normal bowel movement yesterday with no change in her pain.  No anorexia.  She states the car ride over here was painful.  She states this is identical to the pain that she has had since April, but this is the worst that it has ever been.  She tried keeping herself busy without improvement in her symptoms.  There are no aggravating factors.  Is not associate with walking, movement, eating, urination or defecation.  She has a past medical history of fatty liver, hypercholesterolemia, hypertension, MRSA, UTI, elevated LFTs, alcohol use disorder.  She is status post cholecystectomy, hysterectomy, 3 C-sections, and Roux en Y gastric bypass.  No history of hepatitis, cirrhosis.  PCP: [facility].  ER visit 8/28 for right upper quadrant pain.  CT abdomen pelvis done which showed no acute abnormalities given Protonix  and fentanyl .  Referred to GI, sent home with sucralfate .  Saw PCP on 10/3, was referred to GI again.  She has follow-up with them on 11/08/2024  Past Medical History:  Diagnosis Date   Anemia    has required 2 units PRBC   Anxiety    Arthritis    B12 deficiency    Back pain    Bipolar depression (HCC)    Chest pain    Chicken pox    Depression    trazadone has not helped in the past    Fatty liver    H/O alcohol abuse    H/O gastric bypass    Headache    Hepatomegaly    High cholesterol    History of blood transfusion 2009   [place]   Hypertension     Morbid obesity (HCC)    MRSA (methicillin resistant Staphylococcus aureus)    Nausea and vomiting 07/23/2024   Ovarian cyst    Pelvic pain    Scoliosis of thoracic spine    Shortness of breath on exertion    Sinus tachycardia    Sinusitis    Sinusitis, chronic    Sleep apnea    pt states mild case and did not get cpap   UTI (urinary tract infection)    Vitamin D  deficiency     Past Surgical History:  Procedure Laterality Date   ANKLE ARTHROSCOPY Left 05/09/2020   Procedure: ANKLE ARTHROSCOPY OCD REPAIR LEFT;  Surgeon: Juell Radney Soulier, DPM;  Location: [facility];  Service: Podiatry;  Laterality: Left;   CESAREAN SECTION     x3 1995, 1996, 1998   CHOLECYSTECTOMY  1998   COLONOSCOPY WITH PROPOFOL  N/A 05/01/2019   Procedure: COLONOSCOPY WITH PROPOFOL ;  Surgeon: Jinny Carmine, MD;  Location: [facility];  Service: Endoscopy;  Laterality: N/A;   CYSTOSCOPY N/A 07/28/2023   Procedure: CYSTOSCOPY;  Surgeon: Verdon Keen, MD;  Location: [facility];  Service: Gynecology;  Laterality: N/A;   ESOPHAGOGASTRODUODENOSCOPY (EGD) WITH PROPOFOL  N/A 05/01/2019   Procedure: ESOPHAGOGASTRODUODENOSCOPY (EGD) WITH PROPOFOL ;  Surgeon: Jinny Carmine, MD;  Location: [facility];  Service:  Endoscopy;  Laterality: N/A;   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 in [facility]    GRAFT APPLICATION Left 12/04/2021   Procedure: GRAFT APPLICATION - CALCANEAL AUTOGRAFT;  Surgeon: Illianna Paschal Soulier, DPM;  Location: [facility];  Service: Podiatry;  Laterality: Left;   NASAL SINUS SURGERY     2009/2010   ORIF CALCANEOUS FRACTURE Left 12/04/2021   Procedure: REPAIR NONUNION - CALCANEAL EVANS OSTEOTOMY SITE;  Surgeon: Liann Spaeth Soulier, DPM;  Location: [facility];  Service: Podiatry;  Laterality: Left;   OSTECTOMY Left 05/09/2020   Procedure: EVANS/MCDO LEFT;  Surgeon: Charlisa Cham Soulier, DPM;  Location: [facility];  Service: Podiatry;  Laterality: Left;   ROBOTIC ASSISTED TOTAL HYSTERECTOMY WITH BILATERAL SALPINGO OOPHERECTOMY  N/A 07/28/2023   Procedure: XI ROBOTIC ASSISTED TOTAL HYSTERECTOMY WITH BILATERAL SALPINGO OOPHORECTOMY;  Surgeon: Verdon Keen, MD;  Location: [facility];  Service: Gynecology;  Laterality: N/A;   TENDON TRANSFER Left 05/09/2020   Procedure: FDL TRANSFER;DEEP LEFT;  Surgeon: Remmington Urieta Soulier, DPM;  Location: [facility];  Service: Podiatry;  Laterality: Left;   TUBAL LIGATION     uterine ablation  2010   WOUND DEBRIDEMENT Left 05/09/2020   Procedure: A-SCOPE/DEBRIDEMENT/EXTENSIVE LEFT;  Surgeon: Byford Schools Soulier, DPM;  Location: [facility];  Service: Podiatry;  Laterality: Left;    Family History  Problem Relation Age of Onset   Bipolar disorder Mother    Obesity Mother    Sleep apnea Mother    Thyroid  disease Mother    Hyperlipidemia Mother    Cancer Mother 16       lung cancer-small cell lung cancer    Heart disease Mother        MI with stents   Hypertension Mother    Diabetes Mother    COPD Mother    Hypertension Father    Hyperlipidemia Father    Cancer Maternal Aunt        breast cancer   Breast cancer Maternal Aunt    Alcohol abuse Maternal Aunt        Liver Failure   Cancer Maternal Uncle        colon cancer   Alcohol abuse Maternal Grandfather    Heart disease Maternal Grandfather    Cancer Maternal Grandmother        brain cancer    Heart disease Paternal Grandfather    Hypertension Paternal Grandfather    Arthritis Paternal Grandmother    Cancer Paternal Grandmother        leukemia    Hypertension Paternal Grandmother    Breast cancer Paternal Grandmother    Autism spectrum disorder Daughter    Mental illness Son     Social History   Tobacco Use   Smoking status: Never   Smokeless tobacco: Never  Vaping Use   Vaping status: Never Used  Substance Use Topics   Alcohol use: Yes    Alcohol/week: 4.0 standard drinks of alcohol    Types: 3 Glasses of wine, 1 Cans of beer per week    Comment: daily   Drug use: Not Currently    Types: Marijuana    Comment: 17  YEARS AGO     Current Facility-Administered Medications:    ketorolac  (TORADOL ) 30 MG/ML injection 30 mg, 30 mg, Intramuscular, Once, Van Knee, MD  Current Outpatient Medications:    amLODipine  (NORVASC ) 5 MG tablet, Take 1 tablet (5 mg total) by mouth every morning., Disp: 90 tablet, Rfl: 3   cetirizine (ZYRTEC) 10 MG tablet, Take 10 mg  by mouth as needed., Disp: , Rfl:    cyanocobalamin  (VITAMIN B12) 1000 MCG tablet, Take 1 tablet (1,000 mcg total) by mouth daily., Disp: 90 tablet, Rfl: 3   diphenhydrAMINE  (BENADRYL ) 25 MG tablet, Take 25 mg by mouth daily as needed for allergies. , Disp: , Rfl:    docusate sodium  (COLACE) 100 MG capsule, Take 1 capsule (100 mg total) by mouth 2 (two) times daily. To keep stools soft, Disp: 30 capsule, Rfl: 0   EPINEPHrine  0.3 mg/0.3 mL IJ SOAJ injection, Inject 0.3 mg into the muscle as needed for anaphylaxis., Disp: 1 each, Rfl: 1   estradiol  (VIVELLE -DOT) 0.1 MG/24HR patch, Place onto the skin 2 (two) times a week., Disp: , Rfl:    hydrOXYzine  (ATARAX ) 25 MG tablet, Take 1 tablet (25 mg total) by mouth 3 (three) times daily as needed for anxiety. For severe anxiety attacks, Disp: 90 tablet, Rfl: 1   ibuprofen  (ADVIL ) 800 MG tablet, Take 1 tablet (800 mg total) by mouth every 8 (eight) hours as needed., Disp: 30 tablet, Rfl: 0   olmesartan -hydrochlorothiazide  (BENICAR  HCT) 20-12.5 MG tablet, Take 1 tablet by mouth every morning., Disp: 90 tablet, Rfl: 3   ondansetron  (ZOFRAN ) 4 MG tablet, TAKE 1 TABLET BY MOUTH EVERY 8 HOURS AS NEEDED FOR NAUSEA FOR VOMITING, Disp: 30 tablet, Rfl: 0   sucralfate  (CARAFATE ) 1 g tablet, Take 1 tablet (1 g total) by mouth 4 (four) times daily -  with meals and at bedtime for 15 days., Disp: 60 tablet, Rfl: 0   topiramate  (TOPAMAX ) 25 MG tablet, Take 1 tablet by mouth daily., Disp: , Rfl:    venlafaxine  (EFFEXOR ) 100 MG tablet, TAKE ONE-HALF TABLET BY MOUTH TWICE DAILY, Disp: 30 tablet, Rfl: 2  Allergies  Allergen  Reactions   Other Hives     Nut allergy . Walnuts and hazelnuts are the worst.    Penicillins Itching    Hives    Percocet [Oxycodone -Acetaminophen ] Hives    Hives    Ambien [Zolpidem]     Hallucinations falls   Lipitor [Atorvastatin ]     Unable to tolerate h/o fatty liver with elevated lfts    Molds & Smuts Other (See Comments)    Congestion and sneezing    Aspirin  Itching    hives   Latex Rash    Hives .      ROS  As noted in HPI.   Physical Exam  BP (!) 139/102 (BP Location: Left Arm)   Pulse (!) 102   Temp 98.7 F (37.1 C) (Oral)   Resp 18   Wt 93.9 kg   SpO2 100%   BMI 36.67 kg/m   Constitutional: Well developed, well nourished, appears uncomfortable Eyes:  EOMI, conjunctiva normal bilaterally HENT: Normocephalic, atraumatic,mucus membranes moist Respiratory: Normal inspiratory effort, lungs clear bilaterally Cardiovascular: Regular tachycardia, no murmurs rubs or gallops GI: nondistended, soft.  Positive right upper quadrant tenderness.  No guarding, rebound.  No appreciable hepatomegaly.  Positive tap table test Back: No CVAT skin: No rash, skin intact Musculoskeletal: no deformities Neurologic: Alert & oriented x 3, no focal neuro deficits Psychiatric: Speech and behavior appropriate   ED Course   Medications  ketorolac  (TORADOL ) 30 MG/ML injection 30 mg (has no administration in time range)    No orders of the defined types were placed in this encounter.   No results found for this or any previous visit (from the past 24 hours). No results found.  ED Clinical Impression  1. Right  upper quadrant abdominal pain      ED Assessment/Plan     Reviewed outside and ER records.  Additional medical history obtained.  As noted in HPI.  Patient presents with persistent worsening right upper quadrant pain after drinking alcohol last night.  This is an ongoing issue, but she states that this is the worst that it has ever been.  She does have some  mild peritoneal signs.  Unfortunately, we do not have the resources available to fully evaluate this.  I have given her 30 mg of Toradol  here.  She reports itching with aspirin , denies anaphylaxis or hives.  She is willing to try the Toradol .  She declined nausea medication.  I am transferring her to the emergency department for pain control, comprehensive evaluation including labs, possible imaging and possible IV fluids.  Advised patient to be n.p.o. until ER evaluation is complete.  She is stable to go private vehicle.  Her family member is driving her.  Discussed rationale for transfer to the emergency department with the patient.  They agree to go.  Meds ordered this encounter  Medications   ketorolac  (TORADOL ) 30 MG/ML injection 30 mg      *This clinic note was created using Scientist, clinical (histocompatibility and immunogenetics). Therefore, there may be occasional mistakes despite careful proofreading.  ?    Van Knee, MD 08/21/24 [phone]

## 2024-08-21 NOTE — ED Triage Notes (Signed)
 Pt c/o RUQ pain ongoing x4 mon. Was seen at [facility] ED on 8/28 for the same issue. Had CT done which showed fatty liver. Given sucralfate  w/o relief. Has also seen PCP for the same issue & was referred to gastro. Has appt on 11/08/24. No OTC meds.

## 2024-08-26 ENCOUNTER — Other Ambulatory Visit: Payer: Self-pay | Admitting: Nurse Practitioner

## 2024-08-26 DIAGNOSIS — R110 Nausea: Secondary | ICD-10-CM

## 2024-09-06 ENCOUNTER — Ambulatory Visit (INDEPENDENT_AMBULATORY_CARE_PROVIDER_SITE_OTHER): Admitting: Licensed Clinical Social Worker

## 2024-09-06 DIAGNOSIS — F32A Depression, unspecified: Secondary | ICD-10-CM

## 2024-09-06 DIAGNOSIS — F1020 Alcohol dependence, uncomplicated: Secondary | ICD-10-CM | POA: Diagnosis not present
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
## 2024-09-06 NOTE — Telephone Encounter (Signed)
 open in error

## 2024-10-04 ENCOUNTER — Ambulatory Visit (INDEPENDENT_AMBULATORY_CARE_PROVIDER_SITE_OTHER): Admitting: Licensed Clinical Social Worker

## 2024-10-04 DIAGNOSIS — F1020 Alcohol dependence, uncomplicated: Secondary | ICD-10-CM | POA: Diagnosis not present

## 2024-10-04 DIAGNOSIS — F32A Depression, unspecified: Secondary | ICD-10-CM | POA: Diagnosis not present

## 2024-10-04 NOTE — Progress Notes (Unsigned)
 THERAPIST PROGRESS NOTE  Session Time: 3:03 p.m. to  4 p.m.   Virtual Visit via Video Note   I connected with Valerie Morales at 3:03 p.m. EST by a video enabled telemedicine application and verified that I am speaking with the correct person using two identifiers.   Location: Patient: home Provider: [facility] office   I discussed the limitations of evaluation and management by telemedicine and the availability of in person appointments. The patient expressed understanding and agreed to proceed.  Type of Therapy: Individual   Therapist Response/Interventions: Solution focused and MI/the therapist responds to Valerie Morales's statement about needing to know the how related to stopping drinking by informing her that she can learn the how by reading the AA Big Book and from people with successful recovery in the rooms of AA.    Treatment Goals addressed:  Active     Substance Use      Valerie Morales will reduce her alcohol consumption from 3 drinks of alcohol during the week and 6 drinks on the weekend while learning to cope with stress without using alcohol and will explore sober support options.  (Progressing)     Start:  07/10/24    Expected End:  09/06/25         The therapist will assist Valerie Morales in being able to identify and avoid triggers for drinking while learning healthier coping skills to deal with stress and sleep problems.     Start:  07/10/24                     Summary: Valerie Morales presents today saying that not much has changed, and she made the mistake of not taking her medication for three days and started crying for no reason. Valerie Morales says that she did not take it as she could not find it. She says, I am learning how to say 'no.'  She says that work is beginning to get a little better. She says that her boss told them that their mental health matters and to take a mental health day if needed.   As far as the issues with her mother, she says that she no longer looks into  that or focuses on that as it would lead to her drinking more. She is learning different ways to cope.   Valerie Morales denies any issues with depression or anxiety saying, I've got that under control. She has not done any AA meetings or listened to the Big Book as she has been so focused on work.  Her drinking is still the same. She drinks three glasses of wine maybe every day with this being about the same on weekends.   She is unable to say the reason she has not cut back on her drinking but says that her biggest motivation to cutback was being here for her children. At the time, she was drinking 9-10 glasses of wine per day which is now 3 glasses per day. She has been drinking 3 glasses per day for about a month-and-a-half.   Valerie Morales says that her liver enzymes are still elevated. She eventually concludes that she wants to stop drinking but would not likely want to do so until the first of next year. She is agreeable to begin listening to the AA Big Book in anticipation of this.    Progress Towards Goals: Progressing  Suicidal/Homicidal: No SI or HI  Plan: Valerie Morales wishes to return in 4  weeks  Diagnosis: Alcohol Use Disorder, Severe; Unspecified Depression  Collaboration  of Care: Other N/A  Patient/Guardian was advised Release of Information must be obtained prior to any record release in order to collaborate their care with an outside provider. Patient/Guardian was advised if they have not already done so to contact the registration department to sign all necessary forms in order for us  to release information regarding their care.   Consent: Patient/Guardian gives verbal consent for treatment and assignment of benefits for services provided during this visit. Patient/Guardian expressed understanding and agreed to proceed.   Zell Maier, MA, LCSW, [facility], LCAS 12/18//2025

## 2024-10-05 ENCOUNTER — Ambulatory Visit: Admitting: Nurse Practitioner

## 2024-10-16 ENCOUNTER — Telehealth: Payer: Self-pay | Admitting: Psychiatry

## 2024-10-16 NOTE — Telephone Encounter (Signed)
 Mychart message sent to patient with resources as requested.

## 2024-10-18 ENCOUNTER — Other Ambulatory Visit: Payer: Self-pay | Admitting: Nurse Practitioner

## 2024-10-18 DIAGNOSIS — I10 Essential (primary) hypertension: Secondary | ICD-10-CM

## 2024-11-01 ENCOUNTER — Ambulatory Visit (HOSPITAL_COMMUNITY): Admitting: Licensed Clinical Social Worker

## 2024-11-08 ENCOUNTER — Ambulatory Visit (HOSPITAL_COMMUNITY): Admitting: Licensed Clinical Social Worker

## 2024-11-13 ENCOUNTER — Ambulatory Visit: Admitting: Nurse Practitioner
